# Patient Record
Sex: Female | Born: 1945
Health system: Southern US, Community
[De-identification: ages and names within clinical notes are randomized; demographics above are authoritative.]

## PROBLEM LIST (undated history)

## (undated) DIAGNOSIS — M1990 Unspecified osteoarthritis, unspecified site: Secondary | ICD-10-CM

## (undated) DIAGNOSIS — T7840XA Allergy, unspecified, initial encounter: Secondary | ICD-10-CM

## (undated) DIAGNOSIS — M67472 Ganglion, left ankle and foot: Secondary | ICD-10-CM

## (undated) DIAGNOSIS — E785 Hyperlipidemia, unspecified: Secondary | ICD-10-CM

## (undated) DIAGNOSIS — Z9109 Other allergy status, other than to drugs and biological substances: Secondary | ICD-10-CM

## (undated) DIAGNOSIS — H269 Unspecified cataract: Secondary | ICD-10-CM

## (undated) DIAGNOSIS — M8580 Other specified disorders of bone density and structure, unspecified site: Secondary | ICD-10-CM

## (undated) DIAGNOSIS — E119 Type 2 diabetes mellitus without complications: Secondary | ICD-10-CM

## (undated) HISTORY — DX: Other specified disorders of bone density and structure, unspecified site: M85.80

## (undated) HISTORY — DX: Hyperlipidemia, unspecified: E78.5

## (undated) HISTORY — DX: Allergy, unspecified, initial encounter: T78.40XA

## (undated) HISTORY — PX: POLYPECTOMY: SHX149

## (undated) HISTORY — DX: Unspecified osteoarthritis, unspecified site: M19.90

## (undated) HISTORY — DX: Type 2 diabetes mellitus without complications: E11.9

## (undated) HISTORY — DX: Unspecified cataract: H26.9

## (undated) HISTORY — DX: Ganglion, left ankle and foot: M67.472

## (undated) HISTORY — DX: Other allergy status, other than to drugs and biological substances: Z91.09

## (undated) HISTORY — PX: TONSILLECTOMY AND ADENOIDECTOMY: SUR1326

## (undated) HISTORY — PX: GANGLION CYST EXCISION: SHX1691

---

## 2002-10-14 ENCOUNTER — Other Ambulatory Visit: Admission: RE | Admit: 2002-10-14 | Discharge: 2002-10-14 | Payer: Self-pay | Admitting: Obstetrics and Gynecology

## 2004-10-09 ENCOUNTER — Other Ambulatory Visit: Admission: RE | Admit: 2004-10-09 | Discharge: 2004-10-09 | Payer: Self-pay | Admitting: Obstetrics and Gynecology

## 2004-11-06 ENCOUNTER — Ambulatory Visit: Payer: Self-pay | Admitting: Internal Medicine

## 2004-12-13 ENCOUNTER — Ambulatory Visit: Payer: Self-pay | Admitting: Internal Medicine

## 2005-03-28 ENCOUNTER — Ambulatory Visit: Payer: Self-pay | Admitting: Internal Medicine

## 2005-06-10 HISTORY — PX: COLONOSCOPY: SHX174

## 2006-04-22 ENCOUNTER — Ambulatory Visit: Payer: Self-pay | Admitting: Internal Medicine

## 2006-04-22 LAB — CONVERTED CEMR LAB
AST: 25 units/L (ref 0–37)
Chol/HDL Ratio, serum: 3.3
Cholesterol: 151 mg/dL (ref 0–200)
Creatinine, Ser: 0.8 mg/dL (ref 0.4–1.2)
Creatinine,U: 81.4 mg/dL
LDL Cholesterol: 97 mg/dL (ref 0–99)
Microalb, Ur: 0.4 mg/dL (ref 0.0–1.9)
Potassium: 4 meq/L (ref 3.5–5.1)
Triglyceride fasting, serum: 45 mg/dL (ref 0–149)

## 2006-04-30 ENCOUNTER — Ambulatory Visit: Payer: Self-pay | Admitting: Gastroenterology

## 2006-05-19 ENCOUNTER — Encounter (INDEPENDENT_AMBULATORY_CARE_PROVIDER_SITE_OTHER): Payer: Self-pay | Admitting: Specialist

## 2006-05-19 ENCOUNTER — Ambulatory Visit: Payer: Self-pay | Admitting: Gastroenterology

## 2006-10-16 DIAGNOSIS — Z9089 Acquired absence of other organs: Secondary | ICD-10-CM

## 2007-04-08 ENCOUNTER — Telehealth (INDEPENDENT_AMBULATORY_CARE_PROVIDER_SITE_OTHER): Payer: Self-pay | Admitting: *Deleted

## 2007-05-28 ENCOUNTER — Ambulatory Visit: Payer: Self-pay | Admitting: Internal Medicine

## 2007-05-28 DIAGNOSIS — E119 Type 2 diabetes mellitus without complications: Secondary | ICD-10-CM | POA: Insufficient documentation

## 2007-06-08 ENCOUNTER — Encounter (INDEPENDENT_AMBULATORY_CARE_PROVIDER_SITE_OTHER): Payer: Self-pay | Admitting: *Deleted

## 2007-06-18 ENCOUNTER — Ambulatory Visit: Payer: Self-pay | Admitting: Internal Medicine

## 2007-06-18 ENCOUNTER — Encounter (INDEPENDENT_AMBULATORY_CARE_PROVIDER_SITE_OTHER): Payer: Self-pay | Admitting: *Deleted

## 2008-01-21 ENCOUNTER — Telehealth (INDEPENDENT_AMBULATORY_CARE_PROVIDER_SITE_OTHER): Payer: Self-pay | Admitting: *Deleted

## 2008-04-20 ENCOUNTER — Ambulatory Visit: Payer: Self-pay | Admitting: Internal Medicine

## 2008-04-20 DIAGNOSIS — E785 Hyperlipidemia, unspecified: Secondary | ICD-10-CM | POA: Insufficient documentation

## 2008-04-20 DIAGNOSIS — Z86010 Personal history of colonic polyps: Secondary | ICD-10-CM

## 2008-05-02 ENCOUNTER — Encounter (INDEPENDENT_AMBULATORY_CARE_PROVIDER_SITE_OTHER): Payer: Self-pay | Admitting: *Deleted

## 2008-05-09 ENCOUNTER — Ambulatory Visit: Payer: Self-pay | Admitting: Internal Medicine

## 2008-05-09 ENCOUNTER — Encounter (INDEPENDENT_AMBULATORY_CARE_PROVIDER_SITE_OTHER): Payer: Self-pay | Admitting: *Deleted

## 2008-05-09 LAB — CONVERTED CEMR LAB
OCCULT 1: NEGATIVE
OCCULT 2: NEGATIVE
OCCULT 3: NEGATIVE

## 2008-06-28 ENCOUNTER — Ambulatory Visit: Payer: Self-pay | Admitting: Internal Medicine

## 2008-06-28 DIAGNOSIS — R55 Syncope and collapse: Secondary | ICD-10-CM

## 2008-06-28 DIAGNOSIS — R002 Palpitations: Secondary | ICD-10-CM | POA: Insufficient documentation

## 2008-06-29 ENCOUNTER — Encounter: Payer: Self-pay | Admitting: Internal Medicine

## 2008-07-07 ENCOUNTER — Ambulatory Visit: Payer: Self-pay | Admitting: Cardiovascular Disease

## 2008-07-07 ENCOUNTER — Ambulatory Visit: Payer: Self-pay

## 2008-08-04 ENCOUNTER — Telehealth (INDEPENDENT_AMBULATORY_CARE_PROVIDER_SITE_OTHER): Payer: Self-pay | Admitting: *Deleted

## 2009-03-08 ENCOUNTER — Telehealth (INDEPENDENT_AMBULATORY_CARE_PROVIDER_SITE_OTHER): Payer: Self-pay | Admitting: *Deleted

## 2010-06-22 ENCOUNTER — Ambulatory Visit
Admission: RE | Admit: 2010-06-22 | Discharge: 2010-06-22 | Payer: Self-pay | Source: Home / Self Care | Attending: Internal Medicine | Admitting: Internal Medicine

## 2010-06-22 DIAGNOSIS — J069 Acute upper respiratory infection, unspecified: Secondary | ICD-10-CM | POA: Insufficient documentation

## 2010-06-26 ENCOUNTER — Telehealth: Payer: Self-pay | Admitting: Internal Medicine

## 2010-06-28 ENCOUNTER — Telehealth (INDEPENDENT_AMBULATORY_CARE_PROVIDER_SITE_OTHER): Payer: Self-pay | Admitting: *Deleted

## 2010-06-28 ENCOUNTER — Ambulatory Visit
Admission: RE | Admit: 2010-06-28 | Discharge: 2010-06-28 | Payer: Self-pay | Source: Home / Self Care | Attending: Family Medicine | Admitting: Family Medicine

## 2010-07-02 ENCOUNTER — Telehealth (INDEPENDENT_AMBULATORY_CARE_PROVIDER_SITE_OTHER): Payer: Self-pay | Admitting: *Deleted

## 2010-07-08 LAB — CONVERTED CEMR LAB
ALT: 19 units/L (ref 0–35)
AST: 26 units/L (ref 0–37)
Albumin: 4.3 g/dL (ref 3.5–5.2)
Alkaline Phosphatase: 62 units/L (ref 39–117)
Basophils Absolute: 0 10*3/uL (ref 0.0–0.1)
Basophils Absolute: 0 10*3/uL (ref 0.0–0.1)
Basophils Relative: 0.1 % (ref 0.0–3.0)
Bilirubin, Direct: 0.1 mg/dL (ref 0.0–0.3)
CO2: 28 meq/L (ref 19–32)
Chloride: 107 meq/L (ref 96–112)
Cholesterol: 130 mg/dL (ref 0–200)
Creatinine, Ser: 0.7 mg/dL (ref 0.4–1.2)
Creatinine,U: 101.5 mg/dL
Eosinophils Absolute: 0.1 10*3/uL (ref 0.0–0.6)
Eosinophils Absolute: 0.1 10*3/uL (ref 0.0–0.7)
Eosinophils Relative: 1.4 % (ref 0.0–5.0)
Eosinophils Relative: 1.7 % (ref 0.0–5.0)
GFR calc Af Amer: 94 mL/min
GFR calc non Af Amer: 78 mL/min
GFR calc non Af Amer: 91 mL/min
Glucose, Bld: 140 mg/dL — ABNORMAL HIGH (ref 70–99)
HCT: 40.5 % (ref 36.0–46.0)
HDL: 42.3 mg/dL (ref >39.0)
Hgb A1c MFr Bld: 5.5 % (ref 4.6–6.0)
Hgb A1c MFr Bld: 5.6 % (ref 4.6–6.0)
LDL Cholesterol: 85 mg/dL (ref 0–99)
Lymphocytes Relative: 28.8 % (ref 12.0–46.0)
Lymphocytes Relative: 35.4 % (ref 12.0–46.0)
MCHC: 34.9 g/dL (ref 30.0–36.0)
MCHC: 35.8 g/dL (ref 30.0–36.0)
MCV: 87.1 fL (ref 78.0–100.0)
MCV: 89.1 fL (ref 78.0–100.0)
Microalb, Ur: 0.3 mg/dL (ref 0.0–1.9)
Monocytes Absolute: 0.4 10*3/uL (ref 0.2–0.7)
Neutro Abs: 3.6 10*3/uL (ref 1.4–7.7)
Neutrophils Relative %: 56 % (ref 43.0–77.0)
Neutrophils Relative %: 64.3 % (ref 43.0–77.0)
Platelets: 150 10*3/uL (ref 150–400)
Potassium: 3.9 meq/L (ref 3.5–5.1)
Potassium: 4.1 meq/L (ref 3.5–5.1)
RBC: 4.66 M/uL (ref 3.87–5.11)
RBC: 4.83 M/uL (ref 3.87–5.11)
Sodium: 142 meq/L (ref 135–145)
TSH: 0.86 microintl units/mL (ref 0.35–5.50)
TSH: 1.01 microintl units/mL (ref 0.35–5.50)
Total Bilirubin: 0.7 mg/dL (ref 0.3–1.2)
Total CHOL/HDL Ratio: 3.3
VLDL: 7 mg/dL (ref 0–40)
WBC: 7.6 10*3/uL (ref 4.5–10.5)

## 2010-07-12 ENCOUNTER — Telehealth (INDEPENDENT_AMBULATORY_CARE_PROVIDER_SITE_OTHER): Payer: Self-pay | Admitting: *Deleted

## 2010-07-12 NOTE — Assessment & Plan Note (Signed)
Summary: sinus problems, no fever, runny nose, buzzy ears--needs rx fo...   Vital Signs:  Patient profile:   65 year old female Height:      67.5 inches (171.45 cm) Weight:      139.38 pounds (63.35 kg) BMI:     21.59 Temp:     98.9 degrees F (37.17 degrees C) oral Resp:     15 per minute BP sitting:   140 / 62  (left arm) Cuff size:   regular  Vitals Entered By: Lucious Groves CMA (June 22, 2010 12:08 PM) CC: Sinus problems x2 weeks./kb, URI symptoms Is Patient Diabetic? No Pain Assessment Patient in pain? no      Comments Patient notes that she has been having fever, HA, clear mucous production, and congestion/ "buzzing" of her ears. She denies SOB and chest pain.   CC:  Sinus problems x2 weeks./kb and URI symptoms.  History of Present Illness:    Onset 2 weeks ago as ST, loose stool  & watery eyes. Rx: Mucinex, Allegra with benefit.  The patient now reports nasal congestion and clear nasal discharge, but denies productive cough and earache.  The patient denies fever, dyspnea, and wheezing.  Intermittent ST from [facility]. The patient also reports headache.  Risk factors for Strep sinusitis include bilateral facial pain.  The patient denies the following risk factors for Strep sinusitis: tooth pain and tender adenopathy.   FBS ranges : 85- 137; averages 107.  Current Medications (verified): 1)  Onetouch Ultra Test   Strp (Glucose Blood) .... Use As Directed 2)  Garlic .... Bid 3)  Multivitamin .... Once Daily 4)  Allergy Pill .... Prn 5)  B-Complex .... Bid 6)  Flaxseed Oil .... Bid 7)  Fish Oil .... Bid 8)  B-6 .... Qd 9)  Calcium .Marland Kitchen.. 1 By Mouth Bid  Allergies (verified): 1)  ! * Flu Shot 2)  ! * Eggs 3)  ! * [place] 4)  ! * Atropine  Physical Exam  General:  well-nourished,in no acute distress; alert,appropriate and cooperative throughout examination Eyes:  No corneal or conjunctival inflammation noted.Marland Kitchen Perrla.. Vision grossly normal. Ears:  External ear  exam shows no significant lesions or deformities.  Otoscopic examination reveals clear canals, tympanic membranes are intact bilaterally without bulging, retraction, inflammation or discharge. Hearing is grossly normal bilaterally. Nose:  External nasal examination shows no deformity or inflammation. Nasal mucosa are  erythematous without lesions or exudates. Mouth:  Oral mucosa and oropharynx without lesions or exudates.  Teeth in good repair. Lungs:  Normal respiratory effort, chest expands symmetrically. Lungs are clear to auscultation, no crackles or wheezes. Cervical Nodes:  No lymphadenopathy noted Axillary Nodes:  No palpable lymphadenopathy   Impression & Recommendations:  Problem # 1:  URI (ICD-465.9)  Problem # 2:  DIABETES MELLITUS, TYPE II, CONTROLLED (ICD-250.00)  Complete Medication List: 1)  Onetouch Ultra Test Strp (Glucose blood) .... Use as directed 2)  Garlic  .... Bid 3)  Multivitamin  .... Once daily 4)  Allergy Pill  .... Prn 5)  B-complex  .... Bid 6)  Flaxseed Oil  .... Bid 7)  Fish Oil  .... Bid 8)  B-6  .... Qd 9)  Calcium  .Marland KitchenMarland Kitchen. 1 by mouth bid 10)  Fluticasone Propionate 50 Mcg/act Susp (Fluticasone propionate) .Marland Kitchen.. 1 spray two times a day as needed 11)  Azithromycin 250 Mg Tabs (Azithromycin) .... As per pack  Patient Instructions: 1)  Neti pot once daily - two times a day  as needed for congestion. 2)  Drink as much NON dairy  fluid as you can tolerate for the next few days. 3)  Check your blood sugars regularly. If your readings are usually above :150 or below 90 you should contact our office. 4)  Check your feet each night for sore areas, calluses or signs of infection. Prescriptions: AZITHROMYCIN 250 MG TABS (AZITHROMYCIN) as per pack  #1 x 0   Entered and Authorized by:   Marga Melnick MD   Signed by:   Marga Melnick MD on 06/22/2010   Method used:   Faxed to ...       [facility] (retail)       [address] [place]       [address]        [place], [place]  [address]       Ph: [phone]       Fax: [phone]   RxID:   [id number] FLUTICASONE PROPIONATE 50 MCG/ACT SUSP (FLUTICASONE PROPIONATE) 1 spray two times a day as needed  #1 x 5   Entered and Authorized by:   Marga Melnick MD   Signed by:   Marga Melnick MD on 06/22/2010   Method used:   Faxed to ...       [facility] (retail)       [address] [place]       [address]       [place], [place]  [address]       Ph: [phone]       Fax: [phone]   RxID:   [id number]    Orders Added: 1)  Est. Patient Level III [88416]

## 2010-07-12 NOTE — Assessment & Plan Note (Signed)
Summary: Ongoing sinus issues, patient feels cold and overall doesn't ...   Vital Signs:  Patient profile:   65 year old female Weight:      144 pounds BMI:     22.30 Temp:     99.2 degrees F oral BP sitting:   110 / 60  (right arm)  Vitals Entered By: Doristine Devoid CMA (June 28, 2010 4:20 PM) CC: cold and hot flashes blood sugar up and down thinks medication cause this    History of Present Illness: 65 yo woman here today for feeling poorly.  was seen by Dr Alwyn Ren on 1/13 and dx'd w/ sinus infxn.  started on Zpack and nasal spray.  called on 1/16 and spoke w/ triage nurse- called 9-1-1, EMS came to house and took vitals (these were normal).  called office again today and reports still feeling badly.  CBGs ranging 87-149.  reports feeling hot and shakey.  husband reports that she seems to be having 'all the side effects listed on the Zpack'.  facial pain and pressure has improved.  still having 'ear buzzing'.  no cough.  + nausea and diarrhea when illness first started- this has improved.  eating regularly.  Current Medications (verified): 1)  Onetouch Ultra Test   Strp (Glucose Blood) .... Use As Directed 2)  Garlic .... Bid 3)  Multivitamin .... Once Daily 4)  Allergy Pill .... Prn 5)  B-Complex .... Bid 6)  Flaxseed Oil .... Bid 7)  Fish Oil .... Bid 8)  B-6 .... Qd 9)  Calcium .Marland Kitchen.. 1 By Mouth Bid 10)  Fluticasone Propionate 50 Mcg/act Susp (Fluticasone Propionate) .Marland Kitchen.. 1 Spray Two Times A Day As Needed  Allergies (verified): 1)  ! * Flu Shot 2)  ! * Eggs 3)  ! * [place] 4)  ! * Atropine  Review of Systems      See HPI  Physical Exam  General:  well-nourished,in no acute distress; alert,appropriate and cooperative throughout examination Head:  Normocephalic and atraumatic without obvious abnormalities. No apparent alopecia or balding.  no TTP over sinuses Eyes:  no injxn or inflammation Ears:  External ear exam shows no significant lesions or deformities.   Otoscopic examination reveals clear canals, tympanic membranes are intact bilaterally without bulging, retraction, inflammation or discharge. Hearing is grossly normal bilaterally. Nose:  External nasal examination shows no deformity or inflammation. Nasal mucosa are erythematous without lesions or exudates. Mouth:  Oral mucosa and oropharynx without lesions or exudates.  Teeth in good repair. Lungs:  Normal respiratory effort, chest expands symmetrically. Lungs are clear to auscultation, no crackles or wheezes. Abdomen:  soft, NT/ND, +BS   Impression & Recommendations:  Problem # 1:  VIRAL INFECTION-UNSPEC (ICD-079.99) Assessment New no evidence of bacterial infxn on PE.  cluster of sxs consistent w/ viral illness.  phenergan prn for nausea.  reviewed supportive care and red flags that should prompt return.  Pt expresses understanding and is in agreement w/ this plan.  Complete Medication List: 1)  Onetouch Ultra Test Strp (Glucose blood) .... Use as directed 2)  Garlic  .... Bid 3)  Multivitamin  .... Once daily 4)  Allergy Pill  .... Prn 5)  B-complex  .... Bid 6)  Flaxseed Oil  .... Bid 7)  Fish Oil  .... Bid 8)  B-6  .... Qd 9)  Calcium  .Marland KitchenMarland Kitchen. 1 by mouth bid 10)  Fluticasone Propionate 50 Mcg/act Susp (Fluticasone propionate) .Marland Kitchen.. 1 spray two times a day as needed 11)  Promethazine Hcl 25 Mg Tabs (Promethazine hcl) .Marland Kitchen.. 1 tab by mouth q6 as needed for nausea  Patient Instructions: 1)  This is most likely viral and should improve w/ time 2)  Take the Promethazine as needed for nausea 3)  Use ibuprofen for pain or fever 4)  Drink plenty of fluids 5)  Add Mucinex to thin your congestion as needed 6)  Continue the Immodium as needed 7)  Call with any questions or concerns 8)  Hang in there!! Prescriptions: PROMETHAZINE HCL 25 MG  TABS (PROMETHAZINE HCL) 1 tab by mouth Q6 as needed for nausea  #12 x 0   Entered and Authorized by:   Neena Rhymes MD   Signed by:   Neena Rhymes MD on 06/28/2010   Method used:   Electronically to        [facility] [address] (retail)       [phone]       [place], [place]  [address]       Ph: [phone]       Fax: [phone]   RxID:   [id number]    Orders Added: 1)  Est. Patient Level III [24401]

## 2010-07-12 NOTE — Progress Notes (Signed)
Summary: Call-a-Nurse  Phone Note Outgoing Call   Details for Reason: Call-A-Nurse Triage Call Report Triage Record Num: [id number] Operator: Amy Head Patient Name: Lisa Mcgee Call Date & Time: 06/25/2010 10:25:25PM Patient Phone: [phone] PCP: Marga Melnick Patient Gender: Female PCP Fax : [phone] Patient DOB: 23-Nov-1945 Practice Name[facility] Reason for Call: Pt/Lisa Mcgee calling but spoke to pt and states that she feels flushed and BP is 143/60, Diarrhea x 3 times today. BS 149, states feels weird. Throat is dry, ears are ringing, hands are warm cant get comfortable. Took Motrin 800MG  b/c she thought it would help with the inflammation in sinuses. Seen in office on Friday, 06/22/10 for Sinus infection and given Zpack and Fluticasone nose spray. BP normally runs about 116/60. States that she feels"very weird and like her dizziness and faintness is getting worse". Advised calling 911 per guideline. Verbalized understanding. Protocol(s) Used: Hypertension, Diagnosed or Suspected Recommended Outcome per Protocol: Activate EMS 911 Reason for Outcome: New or worsening signs and symptoms that may indicate shock Care Advice:  ~ Protect the patient from falling or other harm.  ~ Do not give the patient anything to eat or drink.  ~ An adult should stay with the patient, preferably one trained in CPR. Lay the person down and elevate legs at least 12 inches (30 cm) above level of heart. Cover to help maintain body temperature.  ~  ~ IMMEDIATE ACTION Write down provider's name. List or place the following in a bag for transport with the patient: current prescription and/or nonprescription medications; alternative treatments, therapies and medications; and street drugs. Summary of Call: I called to check on the patient, and she notes that she is somewhat better but she did call 911. She notes that they did not take her to the hospital and her vitals were normal.  Patient notes that today is her last day of the Z-pack and her sinuses seem to be better. She is thinking that the sinus spray and ABX were too much together. She still has diarrhea and back pain. Please advise. Initial call taken by: Lucious Groves CMA,  June 26, 2010 11:34 AM  Follow-up for Phone Call        Immodium Ad as needed for frankly watery stool. Stay on clear liquids until well. Align once daily until bowels are normal. Stool studies if frank diarrhea persists. Follow-up by: Marga Melnick MD,  June 26, 2010 1:14 PM  Additional Follow-up for Phone Call Additional follow up Details #1::        I spoke w/ pt she is aware. Army Fossa CMA  June 26, 2010 2:23 PM  Pt states her BS- 90 at 1130 am BP 147/60 pulse 83.Army Fossa CMA  June 26, 2010 2:26 PM

## 2010-07-12 NOTE — Progress Notes (Signed)
Summary: call-a-nurse  Phone Note Outgoing Call   Details for Reason: Triage Call Report Triage Record Num: [id number] Operator: Ethlyn Gallery Patient Name: [facility] Call Date & Time: 06/28/2010 6:21:17PM Patient Phone: [phone] PCP: Marga Melnick Patient Gender: Female PCP Fax : [phone] Patient DOB: Jul 04, 1945 Practice Name: [facility] Reason for Call: Cecelia/Pt called and stated she was expecting RX for Promethazine to be called in but she had spoken to the Pharmacist and it was not @ the pharmacy. Called back to [facility] and she stated pharmacy has received the RX and is filling it now. She states she has no other need for assistance @ this time. Protocol(s) Used: Office Note Recommended Outcome per Protocol: Information Noted and Sent to Office Reason for Outcome: Caller information to office Care Advice:  ~ 01/  Follow-up for Phone Call        Noted Follow-up by: Shonna Chock CMA,  July 02, 2010 10:26 AM

## 2010-07-12 NOTE — Progress Notes (Signed)
Summary: Ongoing sinus issues  Phone Note Call from Patient Call back at Home Phone [phone]   Caller: Patient Summary of Call: Message left on tirage voicemail: patient states that she is worse and would like a call   I called the patient back to clarify what's worse, patient states Bloodsugar was 92 this am, patient's Bloodsugar was 135 about 1:58, at 3:07 it was 127. Patient feels cold and shakey and still with sinus symptoms. Patient would like to be seen for her sinus concerns. Patient said she completed zpak but it didnt really help her and threw her for a loop.  Patient aware Dr.Hopper out of office and ok'd seeing  Dr.Tabori/Chrae [facility] CMA  June 28, 2010 3:46 PM

## 2010-07-18 NOTE — Progress Notes (Signed)
Summary: Still not feeling well  Phone Note Call from Patient Call back at Home Phone [phone]   Caller: Patient Summary of Call: Patient called in this morning stating that she still is feeling bad. She says her sinuses are feeling better. Patient is still feeling nauseus at time with cold chills and heat changes frequently. Patient is not taking the Promethazine as ordered by Dr. Beverely Low for the nausea. Patient says the symtpoms tend to start in the evening time. She would like to know what else she needs to be doing. She wants to know if this is still a virus and how long it will last Please advise.  Initial call taken by: Harold Barban,  July 12, 2010 11:26 AM  Follow-up for Phone Call        I spoke with patient and informed her to take med as prescribed for nausea, if symptoms then still unresolved patient to follow-up with Dr.Hopper. Patient ok'd all instruction Follow-up by: Shonna Chock CMA,  July 12, 2010 1:25 PM

## 2010-08-27 ENCOUNTER — Encounter: Payer: Self-pay | Admitting: Internal Medicine

## 2010-08-27 ENCOUNTER — Ambulatory Visit (INDEPENDENT_AMBULATORY_CARE_PROVIDER_SITE_OTHER): Payer: Self-pay | Admitting: Internal Medicine

## 2010-08-27 ENCOUNTER — Other Ambulatory Visit: Payer: Self-pay | Admitting: Internal Medicine

## 2010-08-27 DIAGNOSIS — E119 Type 2 diabetes mellitus without complications: Secondary | ICD-10-CM

## 2010-08-27 DIAGNOSIS — R109 Unspecified abdominal pain: Secondary | ICD-10-CM

## 2010-08-27 DIAGNOSIS — G479 Sleep disorder, unspecified: Secondary | ICD-10-CM

## 2010-08-27 DIAGNOSIS — R002 Palpitations: Secondary | ICD-10-CM

## 2010-08-27 DIAGNOSIS — Z86010 Personal history of colonic polyps: Secondary | ICD-10-CM

## 2010-08-28 ENCOUNTER — Ambulatory Visit
Admission: RE | Admit: 2010-08-28 | Discharge: 2010-08-28 | Disposition: A | Discharge status: Home / Self Care | Payer: Self-pay | Source: Ambulatory Visit | Attending: Internal Medicine | Admitting: Internal Medicine

## 2010-08-28 LAB — CBC WITH DIFFERENTIAL/PLATELET
Basophils Absolute: 0 10*3/uL (ref 0.0–0.1)
Eosinophils Relative: 0.5 % (ref 0.0–5.0)
Lymphs Abs: 1.9 10*3/uL (ref 0.7–4.0)
MCV: 89.2 fl (ref 78.0–100.0)
Monocytes Absolute: 0.3 10*3/uL (ref 0.1–1.0)
Neutrophils Relative %: 78.4 % — ABNORMAL HIGH (ref 43.0–77.0)
Platelets: 180 10*3/uL (ref 150.0–400.0)
RDW: 13.1 % (ref 11.5–14.6)
WBC: 10.5 10*3/uL (ref 4.5–10.5)

## 2010-08-28 LAB — BASIC METABOLIC PANEL
BUN: 9 mg/dL (ref 6–23)
Chloride: 99 mEq/L (ref 96–112)
Creatinine, Ser: 0.7 mg/dL (ref 0.4–1.2)
GFR: 94.65 mL/min (ref >60.00)
Glucose, Bld: 96 mg/dL (ref 70–99)

## 2010-08-28 LAB — TSH: TSH: 0.53 u[IU]/mL (ref 0.35–5.50)

## 2010-08-28 LAB — HEMOGLOBIN A1C: Hgb A1c MFr Bld: 5.7 % (ref 4.6–6.5)

## 2010-09-06 NOTE — Assessment & Plan Note (Signed)
Summary: concerned about blood sugars and not feeling well/cdj   Vital Signs:  Patient profile:   65 year old female Weight:      132.4 pounds BMI:     20.50 Temp:     98.6 degrees F oral Pulse rate:   76 / minute Resp:     15 per minute BP sitting:   130 / 72  (left arm) Cuff size:   regular  Vitals Entered By: Shonna Chock CMA (August 27, 2010 1:32 PM) CC: BS concerns: 145 this morning (patient had 3 crackers, water, black coffee), Abdominal pain, Type 2 diabetes mellitus follow-up, Insomnia   CC:  BS concerns: 145 this morning (patient had 3 crackers, water, black coffee), Abdominal pain, Type 2 diabetes mellitus follow-up, and Insomnia.  History of Present Illness:      Lisa Mcgee  has multiple concerns. She has had   recurrent nausea & abdominal discomfort over past week  ; original symptoms started in 06/2010 with "virus".She  reports constipation and anorexia, but denies vomiting, diarrhea, melena, and hematemesis.  The location of the pain is suprapubic.  The pain is described as dull and non  radiating Associated symptoms include weight loss of 13#.  The patient denies the following symptoms:  fever, dysuria, chest pain ( but palpitations ), jaundice, dark urine, and vaginal bleeding.  The pain is worse with movement.  The pain is better with rocking & "getting my mind off it". She questions relationship to DM.PMH of colon polyp in 2007;   repeat colonoscopy due this year.    She  reports polyuria, but denies polydipsia, blurred vision, self managed hypoglycemia, and numbness of extremities.  The patient denies the following symptoms: neuropathic pain, poor wound healing, vision loss, and foot ulcer.  Since the last visit the patient reports good dietary compliance and monitoring blood glucose.  The patient has been measuring capillary blood glucose: before breakfast( 85-129) and 2 hrs after any meal < 150.  Since the last visit, the patient reports having had no recent  eye care by an  ophthalmologist.        The patient also presents with Insomnia for several months.  The patient reports frequent awakening and early awakening, but denies difficulty falling asleep, nightmares, leg movements, snoring, apnea noted by partner, and daytime somnolence.  Past treatments that have been  ineffective include ibuprofen.  Current Medications (verified): 1)  Onetouch Ultra Test   Strp (Glucose Blood) .... Use As Directed 2)  Garlic .... Bid 3)  Multivitamin .... Once Daily 4)  Allergy Pill .... Prn 5)  Flaxseed Oil .... Bid 6)  Fish Oil .... Bid 7)  B-6 .... Qd 8)  Calcium .Marland Kitchen.. 1 By Mouth Bid 9)  Fluticasone Propionate 50 Mcg/act Susp (Fluticasone Propionate) .Marland Kitchen.. 1 Spray Two Times A Day As Needed 10)  Promethazine Hcl 25 Mg  Tabs (Promethazine Hcl) .Marland Kitchen.. 1 Tab By Mouth Q6 As Needed For Nausea 11)  Cetaphil Moisturizing  Crea (Emollient) .... Two Times A Day As Needed For Itching or Flakey Skin 12)  Ketoconazole 2 % Crea (Ketoconazole) .... As Needed 13)  Dovcnex 0/.005 % Topical Solution .... After Ketocanazole Shampoo 14)  Bactroban 2 % Oint (Mupirocin) .... As Needed 15)  Clobetasol Propionate 0.05 % Crea (Clobetasol Propionate) .... As Needed For Body Rash  Allergies: 1)  ! * Flu Shot 2)  ! * Eggs 3)  ! * [place] 4)  ! * Atropine  Past History:  Past  Medical History: Diabetes mellitus, type II, A1c 11.7 % 10/31/2004 Hyperlipidemia Colonic polyps, PMH  of 2007, Dr Jarold Motto  Physical Exam  General:  Tnin but well-nourished,in no acute distress; alert,appropriate and cooperative throughout examination Eyes:  No corneal or conjunctival inflammation noted. EOMI. Perrla.No lid lag Mouth:  Oral mucosa and oropharynx without lesions or exudates.  Teeth in good repair. No pharyngeal erythema.   Neck:  No deformities, masses, or tenderness noted. Lungs:  Normal respiratory effort, chest expands symmetrically. Lungs are clear to auscultation, no crackles or wheezes. Heart:   normal rate, regular rhythm, no gallop, no rub, no JVD, no HJR, and grade 1 /6 systolic murmur.   Abdomen:  Bowel sounds positive,abdomen soft and non-tender without masses, organomegaly or hernias noted. Aorta  ? enlarged   with  bruit  Skin:  Intact without suspicious lesions or rashes. No jaundice Cervical Nodes:  No lymphadenopathy noted Axillary Nodes:  No palpable lymphadenopathy Psych:  memory intact for recent and remote, normally interactive, and slightly anxious.     Impression & Recommendations:  Problem # 1:  ABDOMINAL PAIN, SUPRAPUBIC (ICD-789.09)  Orders: Venipuncture ([address]) TLB-CBC Platelet - w/Differential (85025-CBCD) Radiology Referral (Radiology)  Problem # 2:  SLEEP DISORDER (ICD-780.50)  Problem # 3:  DIABETES MELLITUS, TYPE II, CONTROLLED (ICD-250.00)  PMH of  Orders: Venipuncture ([address]) TLB-A1C / Hgb A1C (Glycohemoglobin) (83036-A1C)  Problem # 4:  PALPITATIONS (ICD-785.1)  Orders: EKG w/ Interpretation ([address]) Venipuncture ([address]11) TLB-BMP (Basic Metabolic Panel-BMET) (80048-METABOL) TLB-TSH (Thyroid Stimulating Hormone) (84443-TSH)  Problem # 5:  COLONIC POLYPS, HX OF (ICD-V12.72)  Orders: Venipuncture (91478) TLB-CBC Platelet - w/Differential (85025-CBCD)  Complete Medication List: 1)  Onetouch Ultra Test Strp (Glucose blood) .... Use as directed 2)  Garlic  .... Bid 3)  Multivitamin  .... Once daily 4)  Allergy Pill  .... Prn 5)  Flaxseed Oil  .... Bid 6)  Fish Oil  .... Bid 7)  B-6  .... Qd 8)  Calcium  .Marland KitchenMarland Kitchen. 1 by mouth bid 9)  Fluticasone Propionate 50 Mcg/act Susp (Fluticasone propionate) .Marland Kitchen.. 1 spray two times a day as needed 10)  Promethazine Hcl 25 Mg Tabs (Promethazine hcl) .Marland Kitchen.. 1 tab by mouth q6 as needed for nausea 11)  Cetaphil Moisturizing Crea (Emollient) .... Two times a day as needed for itching or flakey skin 12)  Ketoconazole 2 % Crea (Ketoconazole) .... As needed 13)  Dovcnex 0/.005 % Topical Solution  .... After  ketocanazole shampoo 14)  Bactroban 2 % Oint (Mupirocin) .... As needed 15)  Clobetasol Propionate 0.05 % Crea (Clobetasol propionate) .... As needed for body rash 16)  Lorazepam 0.5 Mg Tabs (Lorazepam) .Marland Kitchen.. 1 every 8 hrs as needed stress 17)  Bactroban 2 % Oint (Mupirocin) .... Apply two times a day as needed  Patient Instructions: 1)   Avoid eating within two hours of lying down or before exercising. Do not over eat; try smaller more frequent meals. Elevate head of bed twelve inches when sleeping.Complete stool cards 2)  Take 650-1000mg  of Tylenol every 4-6 hours as needed for relief of pain or comfort of fever AVOID taking more than 4000mg   in a 24 hour period (can cause liver damage in higher doses). Prescriptions: BACTROBAN 2 % OINT (MUPIROCIN) apply two times a day as needed  #15 grams x 1   Entered and Authorized by:   Marga Melnick MD   Signed by:   Marga Melnick MD on 08/27/2010   Method used:   Print then Give to  Patient   RxID:   [id number] LORAZEPAM 0.5 MG TABS (LORAZEPAM) 1 every 8 hrs as needed stress  #30 x 0   Entered and Authorized by:   Marga Melnick MD   Signed by:   Marga Melnick MD on 08/27/2010   Method used:   Print then Give to Patient   RxID:   [id number]    Orders Added: 1)  Est. Patient Level IV [47425] 2)  EKG w/ Interpretation [93000] 3)  Venipuncture [36415] 4)  TLB-BMP (Basic Metabolic Panel-BMET) [80048-METABOL] 5)  TLB-CBC Platelet - w/Differential [85025-CBCD] 6)  TLB-TSH (Thyroid Stimulating Hormone) [84443-TSH] 7)  TLB-A1C / Hgb A1C (Glycohemoglobin) [83036-A1C] 8)  Radiology Referral [Radiology]

## 2010-10-09 ENCOUNTER — Other Ambulatory Visit: Payer: Self-pay

## 2010-10-09 ENCOUNTER — Other Ambulatory Visit: Payer: Self-pay | Admitting: Internal Medicine

## 2010-10-09 DIAGNOSIS — Z1211 Encounter for screening for malignant neoplasm of colon: Secondary | ICD-10-CM

## 2010-10-09 LAB — POC HEMOCCULT BLD/STL (OFFICE/1-CARD/DIAGNOSTIC): Fecal Occult Blood, POC: NEGATIVE

## 2011-04-19 ENCOUNTER — Encounter: Payer: Self-pay | Admitting: Internal Medicine

## 2011-04-19 ENCOUNTER — Ambulatory Visit (INDEPENDENT_AMBULATORY_CARE_PROVIDER_SITE_OTHER): Payer: Self-pay | Admitting: Internal Medicine

## 2011-04-19 DIAGNOSIS — J309 Allergic rhinitis, unspecified: Secondary | ICD-10-CM

## 2011-04-19 DIAGNOSIS — E119 Type 2 diabetes mellitus without complications: Secondary | ICD-10-CM

## 2011-04-19 MED ORDER — FLUTICASONE PROPIONATE 50 MCG/ACT NA SUSP: 2.0000 | Freq: Every day | NASAL | Status: DC | PRN

## 2011-04-19 MED ORDER — LORAZEPAM 0.5 MG PO TABS: 0.5000 mg | ORAL_TABLET | Freq: Three times a day (TID) | ORAL | Status: DC

## 2011-04-19 NOTE — Patient Instructions (Addendum)
Fluticasone 1 spray in each nostril twice a day as needed. Use the "crossover" technique as discussed  Plain Mucinex for thick secretions ;force NON dairy fluids . Use a Neti pot daily as needed for sinus congestion. Mucinex DM as needed for cough.  Use either over-the-counter Zyrtec at bedtime for the allergic symptoms or plain Allegra 180 mg in the day. The Allegra is nonsedating.

## 2011-04-19 NOTE — Progress Notes (Signed)
  Subjective:    Patient ID: Lisa Mcgee, female    DOB: 03-11-1946, 65 y.o.   MRN: [id number]  HPI Respiratory tract infection Onset/symptoms:10/18 as ST Exposures (illness/environmental/extrinsic):no Progression of symptoms:to head congestion with PNDrainage Treatments/response:Neti pot , generic Flonase, NSAIDS, Mucinex, Benadryl ; all beneficial Present symptoms: Fever/chills/sweats:occasional temp & chills Frontal headache:not now Facial pain:no Nasal purulence:prev yellow, now clear Sore throat:not now Dental pain:not  now Lymphadenopathy:no Wheezing/shortness of breath:no Cough/sputum/hemoptysis:no Associated extrinsic/allergic symptoms:itchy eyes/ sneezing:intermittent sneezing Past medical history: Seasonal allergies: yes/asthma:no Smoking history:never           Review of Systems FBS < 109, 93 today     Objective:   Physical Exam General appearance ; thin ,  good health and nourishment; no acute distress or increased work of breathing is present.  No  lymphadenopathy about the head, neck, or axilla noted.   Eyes: No conjunctival inflammation or lid edema is present.   Ears:  External ear exam shows no significant lesions or deformities.  Otoscopic examination reveals clear canals, tympanic membranes are intact bilaterally without bulging, retraction, inflammation or discharge.  Nose:  External nasal examination shows no deformity or inflammation. Nasal mucosa are pink and moist without lesions or exudates. No septal dislocation .No obstruction to airflow.   Oral exam: Dental hygiene is good; lips and gums are healthy appearing.There is no oropharyngeal erythema or exudate noted.   Heart:  Normal rate and regular rhythm. S1 and S2 normal without gallop,  click, rub or other extra sounds.  Grade 1/6 systolic murmur  Lungs:Chest clear to auscultation; no wheezes, rhonchi,rales ,or rubs present.No increased work of breathing.    Extremities:  No cyanosis,  edema, or clubbing  noted    Skin: Warm & dry           Assessment & Plan:  #1 upper respiratory tract symptoms which sounded mainly extrinsic at this time. She notes that Benadryl has been the most beneficial. She does have a strong history of allergies including eggs,poplar trees, beef , & pecans.  She does not have signs of or symptoms of rhinosinusitis at this time. She did have some purulence prior to using any pot.  Plan: See orders and recommendations.

## 2011-04-22 ENCOUNTER — Other Ambulatory Visit: Payer: Self-pay | Admitting: Internal Medicine

## 2011-05-24 ENCOUNTER — Other Ambulatory Visit: Payer: Self-pay

## 2011-05-24 ENCOUNTER — Encounter: Payer: Self-pay | Admitting: Internal Medicine

## 2011-05-24 ENCOUNTER — Ambulatory Visit (INDEPENDENT_AMBULATORY_CARE_PROVIDER_SITE_OTHER): Payer: Self-pay | Admitting: Internal Medicine

## 2011-05-24 VITALS — BP 130/72 | HR 84 | Ht 67.0 in | Wt 135.4 lb

## 2011-05-24 DIAGNOSIS — J309 Allergic rhinitis, unspecified: Secondary | ICD-10-CM

## 2011-05-24 DIAGNOSIS — J3089 Other allergic rhinitis: Secondary | ICD-10-CM

## 2011-05-24 NOTE — Progress Notes (Signed)
05/24/11- 54 yoF never smoker referred courtesy of Dr. hot for for allergy evaluation. She complains of itching and watering eyes, a full discomfort in her ears with popping, sore throats nasal drainage and sneezing "all my life". She remembers being on allergy vaccine for 9 years, about 40 years ago and remembers that as helpful. She blames occasional nausea and bladder symptoms on her nasal drainage. Symptoms are apt to be perennial but worst in the spring and fall. This year she noted onset in October as she returned from the beach. She is taking Flonase, loratadine and saline nasal rinse. History of tonsillectomy. She denies sensitivity to insect stings, asthma or hives. She thinks she was allergic to egg in the past so she declines flu vaccine. She is married, retired and living with husband. Family history that her mother had allergic rhinitis problems.  ROS-see HPI Constitutional:   No-   weight loss, night sweats, fevers, chills, fatigue, lassitude. HEENT:   No-  headaches, difficulty swallowing, tooth/dental problems, sore throat,       + sneezing, itching, ear ache, nasal congestion, post nasal drip,  CV:  No-   chest pain, orthopnea, PND, swelling in lower extremities, anasarca,                                  dizziness, palpitations Resp: No-   shortness of breath with exertion or at rest.              No-   productive cough,  No non-productive cough,  No- coughing up of blood.              No-   change in color of mucus.  No- wheezing.   Skin: No-   rash or lesions. GI:  No-   heartburn, indigestion, abdominal pain,+ nausea, No-vomiting, diarrhea,                 change in bowel habits, loss of appetite GU: +  Dysuria- blamed on allergic postnasal drip(?),No- change in color of urine, no urgency or frequency.  No- flank pain. MS:  No-   joint pain or swelling.  No- decreased range of motion.  No- back pain. Neuro-     nothing unusual Psych:  No- change in mood or affect. No depression  or anxiety.  No memory loss.  OBJ General- Alert, Oriented, Affect-appropriate, Distress- none acute, trim, talkative Skin- rash-none, lesions- none, excoriation- none Lymphadenopathy- none Head- atraumatic            Eyes- Gross vision intact, PERRLA, conjunctivae clear secretions            Ears- Hearing, canals-normal            Nose- Clear, no-Septal dev, mucus, polyps, erosion, perforation             Throat- Mallampati II , mucosa clear , drainage- none, tonsils- atrophic Neck- flexible , trachea midline, no stridor , thyroid nl, carotid no bruit Chest - symmetrical excursion , unlabored           Heart/CV- RRR , no murmur , no gallop  , no rub, nl s1 s2                           - JVD- none , edema- none, stasis changes- none, varices- none  Lung- clear to P&A, wheeze- none, cough- none , dullness-none, rub- none           Chest wall-  Abd- tender-no, distended-no, bowel sounds-present, HSM- no Br/ Gen/ Rectal- Not done, not indicated Extrem- cyanosis- none, clubbing, none, atrophy- none, strength- nl Neuro- grossly intact to observation

## 2011-05-24 NOTE — Patient Instructions (Signed)
Order- Allergy Profile  Dx allergic rhinitis  Schedule return for allergy skin tests as able. Anti- histamines directly block the skin tests so need to be off all allergy meds for 3 days before the testing- including loratadine, allegra and zyrtec, otc sleep meds and cough syrups   Meanwhile, try using Zyrtec/ cetirizine instead of loratadine.

## 2011-05-26 DIAGNOSIS — J310 Chronic rhinitis: Secondary | ICD-10-CM | POA: Insufficient documentation

## 2011-05-26 NOTE — Assessment & Plan Note (Addendum)
The allergens she remembers from skin testing and vaccine therapy 40 years ago seemed to have been mostly foods. She avoids egg based on that history, without any recent confirming symptoms. Her concern that bladder discomfort might be from postnasal drainage is not likely correct. We discussed the nature of allergic reactions, especially immediate reactions related to IgE, which is the group associated with most intense acute symptoms and most easily treated. Plan-allergy profile IgE assessment. We will decide from that whether environmental management and symptomatic therapies will be sufficient. Schedule tentative return for skin testing. Try alternative antihistamine.

## 2011-05-27 LAB — ALLERGY FULL PROFILE
Allergen, D pternoyssinus,d7: <0.1 kU/L (ref <0.35)
Alternaria Alternata: <0.1 kU/L (ref <0.35)
Aspergillus fumigatus, IgG: <0.1 kU/L (ref <0.35)
Bahia Grass: <0.1 kU/L (ref <0.35)
Dog Dander: <0.1 kU/L (ref <0.35)
Elm IgE: <0.1 kU/L (ref <0.35)
G009 Red Top: <0.1 kU/L (ref <0.35)
House Dust Hollister: <0.1 kU/L (ref <0.35)
Lamb's Quarters: <0.1 kU/L (ref <0.35)
Plantain: <0.1 kU/L (ref <0.35)
Sycamore Tree: <0.1 kU/L (ref <0.35)

## 2011-06-10 NOTE — Progress Notes (Signed)
Quick Note:  Pt aware of results and will keep appt for skin testing. ______

## 2011-08-07 ENCOUNTER — Encounter: Payer: Self-pay | Admitting: Gastroenterology

## 2011-08-14 ENCOUNTER — Other Ambulatory Visit: Payer: Self-pay | Admitting: Internal Medicine

## 2011-08-14 ENCOUNTER — Ambulatory Visit (INDEPENDENT_AMBULATORY_CARE_PROVIDER_SITE_OTHER): Payer: Self-pay | Admitting: Internal Medicine

## 2011-08-14 ENCOUNTER — Other Ambulatory Visit: Payer: Self-pay

## 2011-08-14 ENCOUNTER — Encounter: Payer: Self-pay | Admitting: Internal Medicine

## 2011-08-14 VITALS — BP 150/60 | HR 90 | Ht 67.0 in | Wt 137.4 lb

## 2011-08-14 DIAGNOSIS — J3089 Other allergic rhinitis: Secondary | ICD-10-CM

## 2011-08-14 NOTE — Patient Instructions (Signed)
Ok to use daily Claritin/ loratadine, or an alternative like Zyrtec/ cetirizine or Allegra/ fexofenadine  Ok to use Floanase/ fluticasone nasal spray   2 sprays each nostril every night at bedtime (after your Neti pot)   Order- Food Allergy Profile - per [employer]

## 2011-08-14 NOTE — Progress Notes (Addendum)
05/24/11- 49 yoF never smoker referred courtesy of Dr. Alwyn Ren for for allergy evaluation. She complains of itching and watering eyes, a full discomfort in her ears with popping, sore throats nasal drainage and sneezing "all my life". She remembers being on allergy vaccine for 9 years, about 40 years ago and remembers that as helpful. She blames occasional nausea and bladder symptoms on her nasal drainage. Symptoms are apt to be perennial but worst in the spring and fall. This year she noted onset in October as she returned from the beach. She is taking Flonase, loratadine and saline nasal rinse. History of tonsillectomy. She denies sensitivity to insect stings, asthma or hives. She thinks she was allergic to egg in the past so she declines flu vaccine. She is married, retired and living with husband. Family history that her mother had allergic rhinitis problems.  08/14/11-65 yoF never smoker referred courtesy of Dr. Alwyn Ren for for allergy evaluation. She complains of itching and watering eyes, a full discomfort in her ears with popping, sore throats nasal drainage and sneezing "all my life".  She again expresses concern about egg. Nonspecific irritants and strong odors/perfumes are her most obvious triggers. Allergy Profile-05/24/11- Neg- Total IgE 5.7, no specific elevations Allergy skin testing-limited positive intradermal reactions for ragweed, Beech tree and house dust.  ROS-see HPI Constitutional:   No-   weight loss, night sweats, fevers, chills, fatigue, lassitude. HEENT:   No-  headaches, difficulty swallowing, tooth/dental problems, sore throat,       + sneezing, itching, ear ache, nasal congestion, post nasal drip,  CV:  No-   chest pain, orthopnea, PND, swelling in lower extremities, anasarca, dizziness, palpitations Resp: No-   shortness of breath with exertion or at rest.              No-   productive cough,  No non-productive cough,  No- coughing up of blood.              No-   change in  color of mucus.  No- wheezing.   Skin: No-   rash or lesions. GI:  No-   heartburn, indigestion, abdominal pain,+ nausea, No-vomiting, GU: +  Dysuria- blamed on allergic postnasal drip(?),No- change in color of urine, no urgency or frequency.  No- flank pain. MS:  No-   joint pain or swelling. . Neuro-     nothing unusual Psych:  No- change in mood or affect. No depression or anxiety.  No memory loss.  OBJ General- Alert, Oriented, Affect-appropriate, Distress- none acute, trim, talkative Skin- rash-none, lesions- none, excoriation- none Lymphadenopathy- none Head- atraumatic            Eyes- Gross vision intact, PERRLA, conjunctivae clear secretions            Ears- Hearing, canals  +cerumen            Nose- Clear, no-Septal dev, mucus, polyps, erosion, perforation             Throat- Mallampati II , mucosa clear , drainage- none, tonsils- atrophic Neck- flexible , trachea midline, no stridor , thyroid nl, carotid no bruit Chest - symmetrical excursion , unlabored           Heart/CV- RRR , no murmur , no gallop  , no rub, nl s1 s2                           - JVD- none , edema- none, stasis  changes- none, varices- none           Lung- clear to P&A, wheeze- none, cough- none , dullness-none, rub- none           Chest wall-  Abd-  Br/ Gen/ Rectal- Not done, not indicated Extrem- cyanosis- none, clubbing, none, atrophy- none, strength- nl Neuro- grossly intact to observation

## 2011-08-15 LAB — ALLERGEN FOOD PROFILE SPECIFIC IGE
Milk IgE: <0.1 kU/L (ref <0.35)
Orange: <0.1 kU/L (ref <0.35)
Peanut IgE: <0.1 kU/L (ref <0.35)
Soybean IgE: <0.1 kU/L (ref <0.35)
Tomato IgE: <0.1 kU/L (ref <0.35)
Tuna IgE: <0.1 kU/L (ref <0.35)

## 2011-08-15 LAB — ALLERGEN CARROT: Carrot: <0.1 kU/L (ref <0.35)

## 2011-08-15 LAB — ALLERGEN CELERY: Celery: <0.1 kU/L (ref <0.35)

## 2011-08-17 NOTE — Assessment & Plan Note (Signed)
Nonspecific irritants including strong odors seem to be more important now that an IgE mediated allergic process. She may still benefit from antihistamines, nasal saline and nasal steroids.

## 2011-08-22 ENCOUNTER — Telehealth: Payer: Self-pay | Admitting: Internal Medicine

## 2011-08-22 ENCOUNTER — Encounter: Payer: Self-pay | Admitting: Internal Medicine

## 2011-08-22 NOTE — Telephone Encounter (Signed)
Called and spoke with pt. Pt states she had allergy testing done 08/14/11 and also bloodwork.  Pt is requesting these results.  Also states she thought she was tested for eggs and in the past she was told she was allergic to eggs. Wanted to know if this was eggs or egg whites? CY, please advise.  Thanks!

## 2011-08-23 NOTE — Telephone Encounter (Signed)
She did not react to whole egg by skin testing or blood test. If she is concerned, we can double check this with additional blood test for egg yolk and egg white.

## 2011-08-23 NOTE — Telephone Encounter (Signed)
lmtcb

## 2011-08-26 NOTE — Telephone Encounter (Signed)
I have placed this in the mail. Will sign off message

## 2011-08-26 NOTE — Telephone Encounter (Signed)
I spoke with patient about results and she verbalized understanding and had no questions. Pt would like a copy mailed to her for her records. I have confirmed the address and will mail this to her

## 2011-09-09 ENCOUNTER — Telehealth: Payer: Self-pay | Admitting: Internal Medicine

## 2011-09-09 NOTE — Telephone Encounter (Signed)
Spoke with pt. She states that she has been looking at her skin test results, and from what she understood, she was "not really that allergic to a lot", but there are 3 items on the result sheet with a "2" beside them, and she is asking how serious this is and "what does it mean". Please advise, thanks!

## 2011-09-11 NOTE — Telephone Encounter (Signed)
She did react to ragweed, another weed called cocklebur, Beech tree pollen and house dust. If exposed, especially to larger amounts of these, they may cause some allergy symptoms. The same swelling and itching reactions seen in her skin tests may happen in her eyes and airways. The overall allergy antibody levels were not very high, and we couldn't show a sensitivity to foods by this kind of testing.

## 2011-09-11 NOTE — Telephone Encounter (Signed)
Patient is calling again.  She states she still has not heard from nurse about allergy results.  Please return call.  [phone]

## 2011-09-11 NOTE — Telephone Encounter (Signed)
I spoke with pt and advised her still awaiting CDY response. She voiced her understanding. Please advise Dr. Maple Hudson, thanks

## 2011-09-11 NOTE — Telephone Encounter (Signed)
lmtcb

## 2011-09-12 NOTE — Telephone Encounter (Signed)
Spoke with pt and advised no sooner openings with CDY and should keep planned rov with him in May to discuss results in more detail. Pt verbalized understanding.

## 2011-09-12 NOTE — Telephone Encounter (Signed)
Per CY-she can come in for earlier appt if regular space is open-no using RN slots; otherwise he will discuss with her at her next OV.

## 2011-09-12 NOTE — Telephone Encounter (Signed)
I called pt and she is aware of allergy test results per Dr. Maple Hudson. Pt has a multitude of questions that I was not able to answer for her and she would like to speak with Dr. Maple Hudson for these answers. Pt is not due for follow-up until 10/17/2011. Dr. Maple Hudson, pls give pt a call at your convenience or does pt need a sooner appt? Pls advsie.

## 2011-09-16 NOTE — Progress Notes (Signed)
Quick Note:  Pt aware of results. ______ 

## 2011-10-17 ENCOUNTER — Encounter: Payer: Self-pay | Admitting: Internal Medicine

## 2011-10-17 ENCOUNTER — Ambulatory Visit (INDEPENDENT_AMBULATORY_CARE_PROVIDER_SITE_OTHER): Payer: Self-pay | Admitting: Internal Medicine

## 2011-10-17 VITALS — BP 138/66 | HR 82 | Ht 67.0 in | Wt 142.0 lb

## 2011-10-17 DIAGNOSIS — J310 Chronic rhinitis: Secondary | ICD-10-CM

## 2011-10-17 NOTE — Patient Instructions (Signed)
We were not able to demonstrate an IgE type allergic reaction to the materials we tested. As long as you find that antihistamines help, they are fine to use.  Consider the dust and allergy precaution print information I gave you for reducing triggers in your home.

## 2011-10-17 NOTE — Progress Notes (Signed)
05/24/11- 75 yoF never smoker referred courtesy of Dr. Alwyn Ren for for allergy evaluation. She complains of itching and watering eyes, a full discomfort in her ears with popping, sore throats nasal drainage and sneezing "all my life". She remembers being on allergy vaccine for 9 years, about 40 years ago and remembers that as helpful. She blames occasional nausea and bladder symptoms on her nasal drainage. Symptoms are apt to be perennial but worst in the spring and fall. This year she noted onset in October as she returned from the beach. She is taking Flonase, loratadine and saline nasal rinse. History of tonsillectomy. She denies sensitivity to insect stings, asthma or hives. She thinks she was allergic to egg in the past so she declines flu vaccine. She is married, retired and living with husband. Family history that her mother had allergic rhinitis problems.  08/14/11-65 yoF never smoker referred courtesy of Dr. Alwyn Ren for for allergy evaluation. She complains of itching and watering eyes, a full discomfort in her ears with popping, sore throats nasal drainage and sneezing "all my life".  She again expresses concern about egg. Nonspecific irritants and strong odors/perfumes are her most obvious triggers. Allergy Profile-05/24/11- Neg- Total IgE 5.7, no specific elevations Allergy skin testing-limited positive intradermal reactions for ragweed, Beech tree and house dust.  10/17/11-65 yoF never smoker followed for allergic rhinitis/ conjunctivitis Denies any troubles; still using Mucinex and Zyrtec([facility]) When needed she can take an antihistamine and nasal steroid. Allergy profiles- Neg for celery, pecan, carrot, cottonwood  ROS-see HPI Constitutional:   No-   weight loss, night sweats, fevers, chills, fatigue, lassitude. HEENT:   No-  headaches, difficulty swallowing, tooth/dental problems, sore throat,       + sneezing, itching, ear ache, nasal congestion, post nasal drip,  CV:  No-   chest pain,  orthopnea, PND, swelling in lower extremities, anasarca, dizziness, palpitations Resp: No-   shortness of breath with exertion or at rest.              No-   productive cough,  No non-productive cough,  No- coughing up of blood.              No-   change in color of mucus.  No- wheezing.   Skin: No-   rash or lesions. GI:  No-   heartburn, indigestion, abdominal pain,+ nausea, No-vomiting, GU:  MS:  No-   joint pain or swelling. . Neuro-     nothing unusual Psych:  No- change in mood or affect. No depression or anxiety.  No memory loss.  OBJ General- Alert, Oriented, Affect-appropriate, Distress- none acute, trim, talkative Skin- rash-none, lesions- none, excoriation- none Lymphadenopathy- none Head- atraumatic            Eyes- Gross vision intact, PERRLA, conjunctivae clear secretions, not injected            Ears- Hearing, canals  +cerumen            Nose- Clear, no-Septal dev, mucus, polyps, erosion, perforation             Throat- Mallampati II , mucosa clear , drainage- none, tonsils- atrophic Neck- flexible , trachea midline, no stridor , thyroid nl, carotid no bruit Chest - symmetrical excursion , unlabored           Heart/CV- RRR , no murmur , no gallop  , no rub, nl s1 s2                           -  JVD- none , edema- none, stasis changes- none, varices- none           Lung- clear to P&A, wheeze- none, cough- none , dullness-none, rub- none           Chest wall-  Abd-  Br/ Gen/ Rectal- Not done, not indicated Extrem- cyanosis- none, clubbing, none, atrophy- none, strength- nl Neuro- grossly intact to observation     

## 2011-10-21 NOTE — Assessment & Plan Note (Signed)
Additional allergy profile IgE levels she was interested in, particularly pecan, have returned not elevated Her pattern best fits a vasomotor/ irritant  rhinitis. There may be mild increased symptoms in peak seasonal pollen. She does notice symptomatic benefit with antihistamines and nasal steroids so she will continue with these as needed.

## 2011-11-07 NOTE — Progress Notes (Signed)
Quick Note:  LMTCB ______ 

## 2011-11-14 NOTE — Progress Notes (Signed)
Quick Note:  Pt aware of results. ______ 

## 2012-05-01 ENCOUNTER — Encounter: Payer: Self-pay | Admitting: Gastroenterology

## 2012-05-21 ENCOUNTER — Other Ambulatory Visit: Payer: Self-pay | Admitting: Internal Medicine

## 2012-06-10 HISTORY — PX: COLONOSCOPY: SHX174

## 2012-06-18 ENCOUNTER — Encounter: Payer: Self-pay | Admitting: Internal Medicine

## 2012-06-18 ENCOUNTER — Ambulatory Visit: Payer: Self-pay

## 2012-06-19 ENCOUNTER — Other Ambulatory Visit: Payer: Self-pay | Admitting: Internal Medicine

## 2012-06-20 ENCOUNTER — Encounter: Payer: Self-pay | Admitting: Internal Medicine

## 2012-06-20 DIAGNOSIS — R7309 Other abnormal glucose: Secondary | ICD-10-CM

## 2012-06-23 ENCOUNTER — Encounter: Payer: Self-pay | Admitting: Internal Medicine

## 2012-06-24 NOTE — Addendum Note (Signed)
Addended by: Maurice Small on: 06/24/2012 07:56 AM   Modules accepted: Orders

## 2012-07-01 ENCOUNTER — Telehealth: Payer: Self-pay | Admitting: Internal Medicine

## 2012-07-01 ENCOUNTER — Other Ambulatory Visit: Payer: Self-pay

## 2012-07-01 DIAGNOSIS — E119 Type 2 diabetes mellitus without complications: Secondary | ICD-10-CM

## 2012-07-01 NOTE — Telephone Encounter (Signed)
918am called pt on hp# advised pt orders for labs in at [facility] could go anytime between 8-5

## 2012-07-01 NOTE — Telephone Encounter (Signed)
called pt to resch lab appt due to no lab tech, patient states she wants to go [facility] to get her labs done, can you cahnge the orders pt would like to go tomorrow Please let me know when ready and I will gladly call patient back  Thanks

## 2012-07-01 NOTE — Telephone Encounter (Signed)
Orders placed.

## 2012-07-02 ENCOUNTER — Other Ambulatory Visit (INDEPENDENT_AMBULATORY_CARE_PROVIDER_SITE_OTHER): Payer: Self-pay

## 2012-07-02 DIAGNOSIS — E119 Type 2 diabetes mellitus without complications: Secondary | ICD-10-CM

## 2012-07-02 LAB — MICROALBUMIN / CREATININE URINE RATIO
Creatinine,U: 105.3 mg/dL
Microalb Creat Ratio: 0.2 mg/g (ref 0.0–30.0)

## 2012-07-03 ENCOUNTER — Other Ambulatory Visit: Payer: Self-pay | Admitting: Internal Medicine

## 2012-07-08 ENCOUNTER — Other Ambulatory Visit: Payer: Self-pay

## 2012-07-08 MED ORDER — GLUCOSE BLOOD VI STRP: ORAL_STRIP | Status: DC

## 2012-07-08 NOTE — Telephone Encounter (Signed)
RX re-sent , I called the pharmacy and verified rx was received

## 2012-07-25 ENCOUNTER — Other Ambulatory Visit: Payer: Self-pay

## 2012-09-25 ENCOUNTER — Telehealth: Payer: Self-pay | Admitting: Internal Medicine

## 2012-09-25 NOTE — Telephone Encounter (Signed)
Pt called and wanted to change her pharmacy on file to [facility] on [address] and also to see if dr hopper knew were she could get her test strips cheaper for her meter. thanks

## 2012-09-28 NOTE — Telephone Encounter (Signed)
Patient informed she needs to check with the pharmacies and see which meter and supplies better fit into her budget and we can forward rx. Patient will get insurance in October and will then be able to get supplies through insurance. Patient verbalized understanding and will follow-up with [place] on the name of meter and supplies she would like sent to pharmacy.

## 2012-11-23 ENCOUNTER — Encounter: Payer: Self-pay | Admitting: Internal Medicine

## 2012-11-23 DIAGNOSIS — R946 Abnormal results of thyroid function studies: Secondary | ICD-10-CM

## 2012-11-23 NOTE — Telephone Encounter (Signed)
[facility] please advise  

## 2012-11-24 ENCOUNTER — Other Ambulatory Visit (INDEPENDENT_AMBULATORY_CARE_PROVIDER_SITE_OTHER): Payer: Self-pay

## 2012-11-24 DIAGNOSIS — R946 Abnormal results of thyroid function studies: Secondary | ICD-10-CM

## 2012-11-24 LAB — T4, FREE: Free T4: 1.17 ng/dL (ref 0.60–1.60)

## 2012-11-24 LAB — T3, FREE: T3, Free: 2.7 pg/mL (ref 2.3–4.2)

## 2012-11-25 ENCOUNTER — Encounter: Payer: Self-pay | Admitting: Internal Medicine

## 2012-11-26 ENCOUNTER — Encounter: Payer: Self-pay | Admitting: Internal Medicine

## 2013-01-07 ENCOUNTER — Encounter: Payer: Self-pay | Admitting: Gastroenterology

## 2013-03-08 ENCOUNTER — Encounter: Payer: Self-pay | Admitting: Internal Medicine

## 2013-03-10 ENCOUNTER — Encounter: Payer: Self-pay | Admitting: Internal Medicine

## 2013-03-10 ENCOUNTER — Telehealth: Payer: Self-pay | Admitting: Internal Medicine

## 2013-03-10 NOTE — Telephone Encounter (Signed)
Patient is calling requesting a refill for test strips with a diagnosis code written on it.  Also patient wants to know how can  medicare patients get test strips for free.     Pharmacy [facility] @ [address]

## 2013-03-11 ENCOUNTER — Ambulatory Visit: Payer: Self-pay

## 2013-03-12 ENCOUNTER — Other Ambulatory Visit: Payer: Self-pay | Admitting: *Deleted

## 2013-03-12 MED ORDER — GLUCOSE BLOOD VI STRP: ORAL_STRIP | Status: DC

## 2013-03-12 NOTE — Telephone Encounter (Signed)
Test strips sent for refill

## 2013-03-15 NOTE — Telephone Encounter (Signed)
Spoke with the pt and informed her that her test strips have been sent to [facility].  Pt stated that she will ask [facility] to tranfer the rx to [facility].  Informed the pt that she will need to connect her ins. Company regarding the cost on the test strips.  She stated that she showed [facility] her Rx card and they told her that her Medicare part D should take care of her strips.  Asked the pt if she showed [facility] the rx card and she stated no.  Informed the pt to show [facility] the rx card and see what they say, and to tell them what [facility] informed her.  Pt stated that she will go to [facility] and let them know.//AB/CMA

## 2013-03-16 ENCOUNTER — Ambulatory Visit (INDEPENDENT_AMBULATORY_CARE_PROVIDER_SITE_OTHER): Payer: Medicare Other

## 2013-03-16 ENCOUNTER — Encounter: Payer: Self-pay | Admitting: Internal Medicine

## 2013-03-16 ENCOUNTER — Telehealth: Payer: Self-pay | Admitting: Internal Medicine

## 2013-03-16 DIAGNOSIS — Z23 Encounter for immunization: Secondary | ICD-10-CM

## 2013-03-16 NOTE — Telephone Encounter (Signed)
[facility] [address] is calling about the patient's test strips. They need a new rx with the patient's dx code and precise directions written on it. The rx was transferred to them from [place] and per [facility], when rx's get transferred the dx code goes away. Please advise.

## 2013-03-17 ENCOUNTER — Other Ambulatory Visit: Payer: Self-pay | Admitting: *Deleted

## 2013-03-17 ENCOUNTER — Encounter: Payer: Self-pay | Admitting: *Deleted

## 2013-03-17 DIAGNOSIS — E119 Type 2 diabetes mellitus without complications: Secondary | ICD-10-CM

## 2013-03-17 MED ORDER — GLUCOSE BLOOD VI STRP: ORAL_STRIP | Status: DC

## 2013-03-17 NOTE — Telephone Encounter (Signed)
Patient is calling to be advised on which injection (Shingles or Pnuemonia) she is to have next and how far apart she needs to have them. See MyChart email in chart. Patient says her question she sent through MyChart was not answered. Please advise.

## 2013-03-17 NOTE — Telephone Encounter (Signed)
Prescription sent to pharmacy.

## 2013-03-22 LAB — HM PAP SMEAR

## 2013-03-23 ENCOUNTER — Telehealth: Payer: Self-pay | Admitting: Internal Medicine

## 2013-03-23 MED ORDER — LANCET DEVICE MISC: Status: DC

## 2013-03-23 MED ORDER — BLOOD GLUCOSE METER KIT: PACK | Status: DC

## 2013-03-23 NOTE — Telephone Encounter (Signed)
See MyChart message on 03-17-13.//AB/CMA

## 2013-03-23 NOTE — Telephone Encounter (Signed)
Lancets and meter orders sent to pharmacy

## 2013-03-23 NOTE — Telephone Encounter (Signed)
Patient states that she needs an rx for needles with the dx code and directions on how to administer on the script in order for her insurance to cover. She would also like an rx for a new [employer] Contour glucose meter. She uses [facility] on [address].

## 2013-03-25 ENCOUNTER — Ambulatory Visit: Payer: Self-pay

## 2013-03-26 ENCOUNTER — Ambulatory Visit (INDEPENDENT_AMBULATORY_CARE_PROVIDER_SITE_OTHER): Payer: Medicare Other | Admitting: *Deleted

## 2013-03-26 DIAGNOSIS — Z23 Encounter for immunization: Secondary | ICD-10-CM

## 2013-03-29 ENCOUNTER — Encounter: Payer: Self-pay | Admitting: Gastroenterology

## 2013-03-31 ENCOUNTER — Other Ambulatory Visit: Payer: Self-pay | Admitting: Obstetrics and Gynecology

## 2013-03-31 DIAGNOSIS — R928 Other abnormal and inconclusive findings on diagnostic imaging of breast: Secondary | ICD-10-CM

## 2013-04-01 ENCOUNTER — Ambulatory Visit: Payer: Self-pay

## 2013-04-01 ENCOUNTER — Ambulatory Visit (INDEPENDENT_AMBULATORY_CARE_PROVIDER_SITE_OTHER): Payer: Medicare Other | Admitting: *Deleted

## 2013-04-01 DIAGNOSIS — Z2911 Encounter for prophylactic immunotherapy for respiratory syncytial virus (RSV): Secondary | ICD-10-CM

## 2013-04-01 DIAGNOSIS — Z23 Encounter for immunization: Secondary | ICD-10-CM

## 2013-04-08 ENCOUNTER — Encounter: Payer: Self-pay | Admitting: Internal Medicine

## 2013-04-14 ENCOUNTER — Telehealth: Payer: Self-pay

## 2013-04-14 NOTE — Telephone Encounter (Signed)
Pt has EGG ALLERGY

## 2013-04-15 ENCOUNTER — Ambulatory Visit (AMBULATORY_SURGERY_CENTER): Payer: Self-pay

## 2013-04-15 VITALS — Ht 66.5 in | Wt 145.0 lb

## 2013-04-15 DIAGNOSIS — Z86010 Personal history of colonic polyps: Secondary | ICD-10-CM

## 2013-04-15 MED ORDER — MOVIPREP 100 G PO SOLR: ORAL | Status: DC

## 2013-04-15 NOTE — Telephone Encounter (Signed)
Lisa Mcgee  We will use versed/fentanyl for this patient  Thanks,  Jonny Ruiz

## 2013-04-16 ENCOUNTER — Encounter: Payer: Self-pay | Admitting: Gastroenterology

## 2013-04-17 ENCOUNTER — Encounter: Payer: Self-pay | Admitting: Internal Medicine

## 2013-04-17 DIAGNOSIS — M8580 Other specified disorders of bone density and structure, unspecified site: Secondary | ICD-10-CM | POA: Insufficient documentation

## 2013-04-20 ENCOUNTER — Ambulatory Visit
Admission: RE | Admit: 2013-04-20 | Discharge: 2013-04-20 | Disposition: A | Discharge status: Home / Self Care | Payer: Medicare Other | Source: Ambulatory Visit | Attending: Obstetrics and Gynecology | Admitting: Obstetrics and Gynecology

## 2013-04-20 DIAGNOSIS — R928 Other abnormal and inconclusive findings on diagnostic imaging of breast: Secondary | ICD-10-CM

## 2013-04-26 ENCOUNTER — Encounter: Payer: Self-pay | Admitting: Gastroenterology

## 2013-05-05 ENCOUNTER — Ambulatory Visit (AMBULATORY_SURGERY_CENTER): Payer: Medicare Other | Admitting: Gastroenterology

## 2013-05-05 ENCOUNTER — Encounter: Payer: Self-pay | Admitting: Gastroenterology

## 2013-05-05 VITALS — BP 119/63 | HR 67 | Temp 98.0°F | Resp 14 | Ht 66.0 in | Wt 145.0 lb

## 2013-05-05 DIAGNOSIS — Z86010 Personal history of colonic polyps: Secondary | ICD-10-CM

## 2013-05-05 LAB — HM COLONOSCOPY

## 2013-05-05 MED ORDER — SODIUM CHLORIDE 0.9 % IV SOLN: 500.0000 mL | INTRAVENOUS | Status: DC

## 2013-05-05 NOTE — Progress Notes (Signed)
Patient did not experience any of the following events: a burn prior to discharge; a fall within the facility; wrong site/side/patient/procedure/implant event; or a hospital transfer or hospital admission upon discharge from the facility. (G8907) Patient did not have preoperative order for IV antibiotic SSI prophylaxis. (G8918)  

## 2013-05-05 NOTE — Op Note (Signed)
[facility] [address] [place], [address]   COLONOSCOPY PROCEDURE REPORT  PATIENT: Lisa Mcgee, Lisa Mcgee  MR#: [id number] BIRTHDATE: [birth year] , 67  yrs. old GENDER: Female ENDOSCOPIST: Mardella Layman, MD, [facility] REFERRED BY: PROCEDURE DATE:  05/05/2013 PROCEDURE:   Colonoscopy, screening First Screening Colonoscopy - Avg.  risk and is 50 yrs.  old or older - No. ASA CLASS:   Class II INDICATIONS:average risk screening. MEDICATIONS: propofol (Diprivan) 250mg  IV  DESCRIPTION OF PROCEDURE:   After the risks benefits and alternatives of the procedure were thoroughly explained, informed consent was obtained.  A digital rectal exam revealed no abnormalities of the rectum.   The LB [id number]  endoscope was introduced through the anus and advanced to the cecum, which was identified by both the appendix and ileocecal valve. No adverse events experienced.   Limited by a tortuous and redundant colon. The quality of the prep was excellent, using MoviPrep  The instrument was then slowly withdrawn as the colon was fully examined.      COLON FINDINGS: A normal appearing cecum, ileocecal valve, and appendiceal orifice were identified.  The ascending, hepatic flexure, transverse, splenic flexure, descending, sigmoid colon and rectum appeared unremarkable.  No polyps or cancers were seen. Retroflexed views revealed no abnormalities. The time to cecum=8 minutes 32 seconds.  Withdrawal time=6 minutes 1 seconds.  The scope was withdrawn and the procedure completed. COMPLICATIONS: There were no complications.  ENDOSCOPIC IMPRESSION: Normal colon  RECOMMENDATIONS: Continue current colorectal screening recommendations for "routine risk" patients with a repeat colonoscopy in 10 years.   eSigned:  Mardella Layman, MD, [facility] 05/05/2013 8:50 AM   cc:

## 2013-05-05 NOTE — Patient Instructions (Signed)

## 2013-05-10 ENCOUNTER — Telehealth: Payer: Self-pay | Admitting: *Deleted

## 2013-05-10 NOTE — Telephone Encounter (Signed)
  Follow up Call-  Call back number 05/05/2013  Post procedure Call Back phone  # [phone]  Permission to leave phone message Yes     Patient questions:  Do you have a fever, pain , or abdominal swelling? no Pain Score  0 *  Have you tolerated food without any problems? yes  Have you been able to return to your normal activities? yes  Do you have any questions about your discharge instructions: Diet   no Medications  no Follow up visit  no  Do you have questions or concerns about your Care? no  Actions: * If pain score is 4 or above: No action needed, pain <4.

## 2013-05-13 ENCOUNTER — Encounter: Payer: Self-pay | Admitting: *Deleted

## 2013-06-14 ENCOUNTER — Telehealth: Payer: Self-pay

## 2013-06-14 NOTE — Telephone Encounter (Addendum)
Left message for call back  identifiable Medication and allergies:  Reviewed and updated  90 day supply/mail order: na Local pharmacy: [facility]   Immunizations due:  UTD  A/P:   No changes to [place] or PSH or personal hx Pap--03/2013--neg CCS--04/2013--Dr Patterson--next due 2024 MMG--04/2013--incomplete (Left breast calcifications) Flu vaccine--04/2013 Tdap--04/2008 PNA--03/2013 Shingles--03/2013  To Discuss with Provider: Skin cream Left foot bed lump

## 2013-06-15 ENCOUNTER — Telehealth: Payer: Self-pay | Admitting: *Deleted

## 2013-06-15 ENCOUNTER — Encounter: Payer: Self-pay | Admitting: Internal Medicine

## 2013-06-15 ENCOUNTER — Other Ambulatory Visit: Payer: Self-pay | Admitting: *Deleted

## 2013-06-15 ENCOUNTER — Ambulatory Visit (INDEPENDENT_AMBULATORY_CARE_PROVIDER_SITE_OTHER): Payer: Medicare HMO | Admitting: Internal Medicine

## 2013-06-15 VITALS — BP 116/60 | HR 71 | Temp 98.0°F | Ht 67.0 in | Wt 141.0 lb

## 2013-06-15 DIAGNOSIS — Z0000 Encounter for general adult medical examination without abnormal findings: Secondary | ICD-10-CM

## 2013-06-15 DIAGNOSIS — E785 Hyperlipidemia, unspecified: Secondary | ICD-10-CM

## 2013-06-15 DIAGNOSIS — Z860100 Personal history of colon polyps, unspecified: Secondary | ICD-10-CM

## 2013-06-15 DIAGNOSIS — M6740 Ganglion, unspecified site: Secondary | ICD-10-CM

## 2013-06-15 DIAGNOSIS — E119 Type 2 diabetes mellitus without complications: Secondary | ICD-10-CM

## 2013-06-15 LAB — BASIC METABOLIC PANEL
BUN: 13 mg/dL (ref 6–23)
CALCIUM: 9.1 mg/dL (ref 8.4–10.5)
CO2: 27 mEq/L (ref 19–32)
Chloride: 104 mEq/L (ref 96–112)
Creatinine, Ser: 0.8 mg/dL (ref 0.4–1.2)
GFR: 77.57 mL/min (ref >60.00)
GLUCOSE: 106 mg/dL — AB (ref 70–99)
Potassium: 4 mEq/L (ref 3.5–5.1)
Sodium: 139 mEq/L (ref 135–145)

## 2013-06-15 LAB — LIPID PANEL
CHOL/HDL RATIO: 3
CHOLESTEROL: 134 mg/dL (ref 0–200)
HDL: 52.2 mg/dL (ref >39.00)
LDL CALC: 76 mg/dL (ref 0–99)
Triglycerides: 29 mg/dL (ref 0.0–149.0)
VLDL: 5.8 mg/dL (ref 0.0–40.0)

## 2013-06-15 LAB — HEPATIC FUNCTION PANEL
ALK PHOS: 60 U/L (ref 39–117)
ALT: 19 U/L (ref 0–35)
AST: 25 U/L (ref 0–37)
Albumin: 4.2 g/dL (ref 3.5–5.2)
BILIRUBIN DIRECT: 0.1 mg/dL (ref 0.0–0.3)
Total Bilirubin: 0.4 mg/dL (ref 0.3–1.2)
Total Protein: 7.4 g/dL (ref 6.0–8.3)

## 2013-06-15 LAB — CBC WITH DIFFERENTIAL/PLATELET
BASOS ABS: 0 10*3/uL (ref 0.0–0.1)
Basophils Relative: 0.5 % (ref 0.0–3.0)
Eosinophils Absolute: 0.1 10*3/uL (ref 0.0–0.7)
Eosinophils Relative: 1.6 % (ref 0.0–5.0)
HEMATOCRIT: 38.5 % (ref 36.0–46.0)
Hemoglobin: 13.5 g/dL (ref 12.0–15.0)
LYMPHS PCT: 32 % (ref 12.0–46.0)
Lymphs Abs: 2.2 10*3/uL (ref 0.7–4.0)
MCHC: 35 g/dL (ref 30.0–36.0)
MCV: 84.8 fl (ref 78.0–100.0)
Monocytes Absolute: 0.5 10*3/uL (ref 0.1–1.0)
Monocytes Relative: 7.1 % (ref 3.0–12.0)
NEUTROS PCT: 58.8 % (ref 43.0–77.0)
Neutro Abs: 4 10*3/uL (ref 1.4–7.7)
PLATELETS: 158 10*3/uL (ref 150.0–400.0)
RBC: 4.54 Mil/uL (ref 3.87–5.11)
RDW: 13.5 % (ref 11.5–14.6)
WBC: 6.8 10*3/uL (ref 4.5–10.5)

## 2013-06-15 LAB — MICROALBUMIN / CREATININE URINE RATIO
Creatinine,U: 73.7 mg/dL
MICROALB UR: 0.2 mg/dL (ref 0.0–1.9)
Microalb Creat Ratio: 0.3 mg/g (ref 0.0–30.0)

## 2013-06-15 LAB — HEMOGLOBIN A1C: HEMOGLOBIN A1C: 5.8 % (ref 4.6–6.5)

## 2013-06-15 LAB — TSH: TSH: 0.67 u[IU]/mL (ref 0.35–5.50)

## 2013-06-15 MED ORDER — GLUCOSE BLOOD VI STRP: ORAL_STRIP | Status: DC

## 2013-06-15 MED ORDER — LANCET DEVICE MISC: Status: DC

## 2013-06-15 NOTE — Telephone Encounter (Signed)
Pharmacist called and had questions about Triamcinolone with it being a compound.

## 2013-06-15 NOTE — Progress Notes (Signed)
Pre-visit discussion using our clinic review tool. No additional management support is needed unless otherwise documented below in the visit note.  

## 2013-06-15 NOTE — Progress Notes (Signed)
Subjective:    Patient ID: Lisa Mcgee, female    DOB: 1945-10-29, 68 y.o.   MRN: [id number]  HPI Medicare Wellness Visit: Psychosocial and medical history were reviewed as required by Medicare (history related to abuse, antisocial behavior , firearm risk). Social history: Caffeine: no , Alcohol: no , Tobacco IRC:VELFY Exercise:30 minutes daily X 5 as water aerobics Personal safety/fall risk:no Limitations of activities of daily living:no Seatbelt/ smoke alarm use:yes Healthcare Power of Attorney/Living Will status: needed Ophthalmologic exam status:UTD Hearing evaluation status:not current Orientation: Oriented X3 Memory and recall: good Spelling  testing: good Depression/anxiety assessment: no Foreign travel [place] Immunization status for influenza/pneumonia/ shingles /tetanus:UTD Transfusion history:no Preventive health care maintenance status: Colonoscopy/BMD/mammogram/Pap as per protocol/standard care:UTD; repeat mammograms in 6 mos Dental care:every 6 months Chart reviewed and updated. Active issues reviewed and addressed as documented below.    Review of Systems Diabetes status assessment: Fasting or morning glucose range 102-123. Highest glucose 2 hours after any meal is not monitored < 130. No hypoglycemia reported                                                          Diet : modified heart healthy / gluten free No medications by Rx. Eye exam current. Foot care not current  No excess thirst ;  excess hunger ; or excess urination reported                              No lightheadedness with standing reported No chest pain ; palpitations ; claudication described .                                                                                                                             No non healing skin  ulcers or sores of extremities noted. No numbness or tingling or burning in feet described                                                                                                                                              Change in weight of 7 #  pound gain . No blurred,double, or loss of vision reported  .         Objective:   Physical Exam Gen.: Thin but healthy and well-nourished in appearance. Alert, appropriate and cooperative throughout exam.  Head: Normocephalic without obvious abnormalities  Eyes: No corneal or conjunctival inflammation noted. Pupils equal round reactive to light and accommodation. Extraocular motion intact.  Ears: External  ear exam reveals no significant lesions or deformities. Canals clear .TMs normal. Hearing is grossly normal bilaterally. Nose: External nasal exam reveals no deformity or inflammation. Nasal mucosa are pink and moist. No lesions or exudates noted.  Mouth: Oral mucosa and oropharynx reveal no lesions or exudates. Teeth in good repair. Neck: No deformities, masses, or tenderness noted. Range of motion slightly decreased. Thyroid normal. Lungs: Normal respiratory effort; chest expands symmetrically. Lungs are clear to auscultation without rales, wheezes, or increased work of breathing. Heart: Normal rate and rhythm. Normal S1 and S2. No gallop, click, or rub. S4 w/o murmur. Abdomen: Bowel sounds normal; abdomen soft and nontender. No masses, organomegaly or hernias noted. Aorta palpable ; no AAA Genitalia:  as per Gyn                                  Musculoskeletal/extremities: No deformity or scoliosis noted of  the thoracic or lumbar spine.   No clubbing, cyanosis, edema, or significant extremity  deformity noted. Range of motion normal .Tone & strength normal. Hand joints normal . Fingernail / toenail health good.Ganglion cyst L arch Able to lie down & sit up w/o help. Negative SLR bilaterally Vascular: Carotid, radial artery, dorsalis pedis and  posterior tibial pulses are full and equal. No bruits present. Neurologic: Alert and oriented x3. Deep tendon reflexes symmetrical and  normal.        Skin: Intact without suspicious lesions or rashes. Lymph: No cervical, axillary lymphadenopathy present. Psych: Mood and affect are normal. Normally interactive                                                                                        Assessment & Plan:  #1 Medicare Wellness Exam; criteria met ; data entered #2 Problem List/Diagnoses reviewed Plan:  Assessments made/ Orders entered

## 2013-06-15 NOTE — Telephone Encounter (Signed)
Please advise 

## 2013-06-15 NOTE — Patient Instructions (Addendum)
Your next office appointment will be determined based upon review of your pending labs . Those instructions will be transmitted to you through My Chart  Please do not use Q-tips as we discussed. Should wax build up occur, please put 2-3 drops of mineral oil in the affected  ear at night to soften the wax .Cover the canal with a  cotton ball to prevent the oil from staining bed linens. In the morning fill the ear canal with hydrogen peroxide & lie in the opposite lateral decubitus position(on the side opposite the affected ear)  for 10-15 minutes. After allowing this period of time for the peroxide to dissolve the wax ;shower and use the thinnest washrag available to wick out the wax. If both ears are involved ; alternate this treatment from ear to ear each night until no wax is found on the washrag. 

## 2013-06-15 NOTE — Telephone Encounter (Signed)
She needs to take prior labeled Rx from Dr Sallye Lat; hepatic copied off his specific directions.

## 2013-06-16 ENCOUNTER — Telehealth: Payer: Self-pay

## 2013-06-16 NOTE — Telephone Encounter (Signed)
Rx clarified to dispense as written before.

## 2013-06-16 NOTE — Telephone Encounter (Signed)
Relevant patient education assigned to patient using Emmi. ° °

## 2013-06-17 NOTE — Telephone Encounter (Signed)
Informed pt by MyChart that Dr. Park Meo ordering the Vit. D level with the TG:PQDIYMEBRA(309.40).//HW/KGS

## 2013-06-17 NOTE — Telephone Encounter (Signed)
Patient called and wanted to know if she were to have her Vitamin D checked which code would dr hopper use for her 82.306, 826.52?

## 2013-06-22 ENCOUNTER — Ambulatory Visit (INDEPENDENT_AMBULATORY_CARE_PROVIDER_SITE_OTHER): Payer: Medicare HMO | Admitting: Family Medicine

## 2013-06-22 ENCOUNTER — Telehealth: Payer: Self-pay | Admitting: *Deleted

## 2013-06-22 ENCOUNTER — Encounter: Payer: Self-pay | Admitting: Family Medicine

## 2013-06-22 ENCOUNTER — Other Ambulatory Visit (INDEPENDENT_AMBULATORY_CARE_PROVIDER_SITE_OTHER): Payer: Medicare HMO

## 2013-06-22 VITALS — BP 120/68 | HR 74 | Temp 98.3°F | Resp 16 | Wt 144.6 lb

## 2013-06-22 DIAGNOSIS — M79609 Pain in unspecified limb: Secondary | ICD-10-CM

## 2013-06-22 DIAGNOSIS — M6740 Ganglion, unspecified site: Secondary | ICD-10-CM

## 2013-06-22 DIAGNOSIS — M79672 Pain in left foot: Secondary | ICD-10-CM

## 2013-06-22 DIAGNOSIS — M67472 Ganglion, left ankle and foot: Secondary | ICD-10-CM | POA: Insufficient documentation

## 2013-06-22 HISTORY — DX: Ganglion, left ankle and foot: M67.472

## 2013-06-22 MED ORDER — CEPHALEXIN 500 MG PO CAPS: 500.0000 mg | ORAL_CAPSULE | Freq: Two times a day (BID) | ORAL | Status: DC

## 2013-06-22 NOTE — Progress Notes (Signed)
  I'm seeing this patient by the request  of:  Unice Cobble, MD  CC: Left foot mass  HPI: Patient is a very pleasant 68 year old female coming in with a lap foot mass. Patient states it is on the plantar aspect of her foot and was told that it was a cyst by her primary care provider. Patient is here for evaluation and possible treatment. Patient denies any significant pain, denies any numbness. Patient states that it's probably been the years but seems to have increased in size over the course of the last several months. Patient states it is not affecting her activities of daily living patient would just like evaluation. Patient was the severity a 0/10   Past medical, surgical, family and social history reviewed. Medications reviewed all in the electronic medical record.   Review of Systems: No headache, visual changes, nausea, vomiting, diarrhea, constipation, dizziness, abdominal pain, skin rash, fevers, chills, night sweats, weight loss, swollen lymph nodes, body aches, joint swelling, muscle aches, chest pain, shortness of breath, mood changes.   Objective:    Blood pressure 120/68, pulse 74, temperature 98.3 F (36.8 C), temperature source Oral, resp. rate 16, weight 144 lb 9.6 oz (65.59 kg), SpO2 97.00%.   General: No apparent distress alert and oriented x3 mood and affect normal, dressed appropriately.  HEENT: Pupils equal, extraocular movements intact Respiratory: Patient's speak in full sentences and does not appear short of breath Cardiovascular: No lower extremity edema, non tender, no erythema Skin: Warm dry intact with no signs of infection or rash on extremities or on axial skeleton. Abdomen: Soft nontender Neuro: Cranial nerves II through XII are intact, neurovascularly intact in all extremities with 2+ DTRs and 2+ pulses. Lymph: No lymphadenopathy of posterior or anterior cervical chain or axillae bilaterally.  Gait normal with good balance and coordination.  MSK: Non  tender with full range of motion and good stability and symmetric strength and tone of shoulders, elbows, wrist, hip, knee and ankles bilaterally.  Foot exam shows the patient has pes cavus bilaterally. Patient left foot superficial to the fascia does have an area that has a cystic structure measures approximately 1 cm in diameter. This is nontender on exam. She is neurovascularly intact distally.  Limited muscular skeletal ultrasound was performed and interpreted by Hulan Saas, M Limited muscular skeletal ultrasound shows the patient cystic structure on the plantar aspect of the foot measures approximately 1.3 cm in diameter. This does appear to have fluid in the area but not hypoechoic. This is somewhat compressible. This is not a soft tissue mass. This is superficial to the fascia.  Impression: Ganglion cyst of the plantar aspect of left foot  After verbal consent patient was prepped with alcohol swabs and with a 27-gauge 1-1/2 inch needle had 3 cc of 0.5% Marcaine injected. Patient then had an 18-gauge 1-1/2 inch needle inserted and we did have jellylike substance removed as well as a mild blood. With compression force we were able to get out approximately 6 cc of the jellylike fluid. 1 cc of Kenalog 40 mg/dL was then injected. Patient then was prepped with a pressure dressing and post injection and aspiration instructions given.  Impression and Recommendations:     This case required medical decision making of moderate complexity.

## 2013-06-22 NOTE — Patient Instructions (Signed)
Very nice to meet you You do have a ganglion cyst You will be sore in 6 hours  Ice 20 minutes 2 times a day Spenco orthotics at [employer] or [employer] can help.  Cost about $30.  Bronwen Betters or Dansko would be best shoes for you 40% can come back so would see you again in 1-2 months if giving you troble Keflex if you notice any redness.

## 2013-06-22 NOTE — Assessment & Plan Note (Signed)
Aspirated under ultrasound guidance today. Patient tolerated the procedure fairly well. Patient did have a lot of scarring of the sac of the cyst. Discussed with patient that 40% of these can recur. Patient was concerned about potential post injection infection so a antibiotic was given the patient a secondary to her anxiety. Patient can follow up in 1-2 months if this seems to reaccumulate and we can try to make a larger incision to allow for better drainage. As long as it does not seem to hurt patient though I would continue with aspiration by needle. We did discuss different shoe wear as well as orthotics that could be beneficial to relieve the chronic distress secondary to her high arches.

## 2013-06-22 NOTE — Telephone Encounter (Signed)
Patient called and stated that she had a pap smear done on 03/22/2013 but my chart is stating that she is due. Also patient stated that she e-mail [place] back on my chart regarding her vitamin d level and no one has gotten back with her.

## 2013-06-25 ENCOUNTER — Other Ambulatory Visit: Payer: Medicare HMO

## 2013-06-28 ENCOUNTER — Other Ambulatory Visit (INDEPENDENT_AMBULATORY_CARE_PROVIDER_SITE_OTHER): Payer: Medicare HMO

## 2013-06-28 ENCOUNTER — Other Ambulatory Visit: Payer: Medicare HMO

## 2013-06-28 DIAGNOSIS — Z0000 Encounter for general adult medical examination without abnormal findings: Secondary | ICD-10-CM

## 2013-07-02 LAB — VITAMIN D 1,25 DIHYDROXY
Vitamin D 1, 25 (OH)2 Total: 29 pg/mL (ref 18–72)
Vitamin D3 1, 25 (OH)2: 29 pg/mL

## 2013-07-07 ENCOUNTER — Encounter: Payer: Self-pay | Admitting: Internal Medicine

## 2013-08-04 ENCOUNTER — Encounter: Payer: Self-pay | Admitting: Internal Medicine

## 2013-08-04 DIAGNOSIS — E559 Vitamin D deficiency, unspecified: Secondary | ICD-10-CM | POA: Insufficient documentation

## 2013-08-24 ENCOUNTER — Encounter: Payer: Self-pay | Admitting: Internal Medicine

## 2013-08-24 ENCOUNTER — Telehealth: Payer: Self-pay | Admitting: *Deleted

## 2013-08-24 NOTE — Telephone Encounter (Signed)
Pt called states she has an appoint 3.18.15 with PCP for her foot pain, requesting whether she should see PCP or go to [facility].  Please advise

## 2013-08-24 NOTE — Telephone Encounter (Signed)
Specialists & insurance organizations  require an updated, current  assessment and written note from the Primary Care physician  to review before they  schedule an appointment to assess symptoms or problems. As we do not have such  a current  assessment of your foot issue or complaint in the chart (electronic medical record);you should keep appointment to create this document THEY REQUIRE. It will be necessary to know prior evaluations and treatments of this symptom and response to these interventions. Please bring that medical history & all medications & supplements to that appointment so I can complete the required document.

## 2013-08-24 NOTE — Telephone Encounter (Signed)
Spoke with pt advised of MDs message 

## 2013-08-25 ENCOUNTER — Encounter: Payer: Self-pay | Admitting: Internal Medicine

## 2013-08-25 ENCOUNTER — Ambulatory Visit (INDEPENDENT_AMBULATORY_CARE_PROVIDER_SITE_OTHER): Payer: Medicare HMO | Admitting: Internal Medicine

## 2013-08-25 VITALS — BP 130/60 | HR 74 | Temp 98.6°F | Wt 149.0 lb

## 2013-08-25 DIAGNOSIS — L538 Other specified erythematous conditions: Secondary | ICD-10-CM

## 2013-08-25 DIAGNOSIS — L26 Exfoliative dermatitis: Secondary | ICD-10-CM

## 2013-08-25 MED ORDER — KETOCONAZOLE 2 % EX CREA: 1.0000 "application " | TOPICAL_CREAM | Freq: Two times a day (BID) | CUTANEOUS | Status: DC | PRN

## 2013-08-25 NOTE — Progress Notes (Signed)
Pre visit review using our clinic review tool, if applicable. No additional management support is needed unless otherwise documented below in the visit note. 

## 2013-08-25 NOTE — Patient Instructions (Signed)
Keep the  area as dry as possible. Apply Nizoral twice a day until rash is gone.

## 2013-08-25 NOTE — Progress Notes (Signed)
   Subjective:    Patient ID: Lisa Mcgee, female    DOB: 1946-04-19, 68 y.o.   MRN: [id number]  HPI she has developed a localized area of pruritic, exfoliative dermatitis over the mid sole of the left foot. This is in the context of having engaged in water aerobics 2 days a week over the last 3-4 weeks.  This is in the area where the ganglion cyst was ruptured by Dr. Tamala Julian 06/22/13. After the procedure she had no localized cellulitis or pustule formation. She has no fever, chills, sweats, weight loss.     Review of Systems  She has had no other associated rashes other than some eczematoid rash for which she sees Dr. Radford Pax  Specifically she's had no inguinal area or inframammary rash.  Her blood glucoses are well controlled.      Objective:   Physical Exam  She appears healthy and well-nourished in no distress  There are scattered faint flat erythematous lesions on lower extremities which were diagnosed as "dry patches" She has a 1.5 x 2 cm exfoliative, dry slightly erythematous lesion over the mid medial arch of the left foot  Her toenails are healthy.  Pedal pulses are equal but decreased. She has no ischemic changes over the feet.        Assessment & Plan:  #1 dermatitis L sole after water aerobic exposure  See orders

## 2013-09-13 ENCOUNTER — Other Ambulatory Visit: Payer: Self-pay | Admitting: Obstetrics and Gynecology

## 2013-09-13 DIAGNOSIS — R921 Mammographic calcification found on diagnostic imaging of breast: Secondary | ICD-10-CM

## 2013-10-08 ENCOUNTER — Ambulatory Visit (INDEPENDENT_AMBULATORY_CARE_PROVIDER_SITE_OTHER): Payer: Medicare HMO | Admitting: Internal Medicine

## 2013-10-08 ENCOUNTER — Encounter: Payer: Self-pay | Admitting: Internal Medicine

## 2013-10-08 VITALS — BP 120/60 | HR 68 | Temp 98.5°F | Resp 13 | Wt 147.2 lb

## 2013-10-08 DIAGNOSIS — J309 Allergic rhinitis, unspecified: Secondary | ICD-10-CM

## 2013-10-08 DIAGNOSIS — R059 Cough, unspecified: Secondary | ICD-10-CM

## 2013-10-08 DIAGNOSIS — R05 Cough: Secondary | ICD-10-CM

## 2013-10-08 MED ORDER — MONTELUKAST SODIUM 10 MG PO TABS: 10.0000 mg | ORAL_TABLET | Freq: Every day | ORAL | Status: DC

## 2013-10-08 MED ORDER — AMOXICILLIN 500 MG PO CAPS: 500.0000 mg | ORAL_CAPSULE | Freq: Three times a day (TID) | ORAL | Status: DC

## 2013-10-08 NOTE — Patient Instructions (Signed)

## 2013-10-08 NOTE — Progress Notes (Signed)
   Subjective:    Patient ID: Lisa Mcgee, female    DOB: 12-12-1945, 68 y.o.   MRN: [id number]  HPI   Her symptoms began 10/05/13 as sore throat, rhinitis, head congestion, and nonproductive cough. She also had itchy, watery eyes. She has felt hot but did not take her temperature. She's had chills and sweats.  She has developed a cough which is productive of clear sputum.  Her husband has has had a similar process.  She does use Flonase, Zyrtec, Mucinex with only partial response  She continues to have frontal headache, facial pain, dental pain, sore throat, earache, dry cough, and extrinsic symptoms.   Review of Systems She is not having any otic  discharge, purulent nasal secretions, purulent sputum, wheezing, shortness of breath.      Objective:   Physical Exam General appearance:good health ;well nourished; no acute distress or increased work of breathing is present.  No  lymphadenopathy about the head, neck, or axilla noted.   Eyes: No conjunctival inflammation or lid edema is present. There is no scleral icterus.  Ears:  External ear exam shows no significant lesions or deformities.  Otoscopic examination reveals clear canals, tympanic membranes are intact bilaterally without bulging, retraction, inflammation or discharge. L TM dull  Nose:  External nasal examination shows no deformity or inflammation. Nasal mucosa are pink and moist without lesions or exudates. No septal dislocation or deviation.No obstruction to airflow.   Oral exam: Dental hygiene is good; lips and gums are healthy appearing.There is no oropharyngeal erythema or exudate noted.   Neck:  No deformities, thyromegaly, masses, or tenderness noted.   Supple with full range of motion without pain.   Heart:  Normal rate and regular rhythm. S1 and S2 normal without gallop, murmur, click, rub or other extra sounds.   Lungs:Chest clear to auscultation;rare isolated  Wheezes. No rhonchi,rales ,or rubs present.No  increased work of breathing.    Extremities:  No cyanosis, edema, or clubbing  noted    Skin: Warm & slightly damp         Assessment & Plan:  #1 allergic rhinitis See AVS

## 2013-10-08 NOTE — Progress Notes (Signed)
Pre visit review using our clinic review tool, if applicable. No additional management support is needed unless otherwise documented below in the visit note. 

## 2013-10-19 ENCOUNTER — Ambulatory Visit
Admission: RE | Admit: 2013-10-19 | Discharge: 2013-10-19 | Disposition: A | Discharge status: Home / Self Care | Payer: Medicare HMO | Source: Ambulatory Visit | Attending: Obstetrics and Gynecology | Admitting: Obstetrics and Gynecology

## 2013-10-19 DIAGNOSIS — R921 Mammographic calcification found on diagnostic imaging of breast: Secondary | ICD-10-CM

## 2014-03-01 ENCOUNTER — Ambulatory Visit (INDEPENDENT_AMBULATORY_CARE_PROVIDER_SITE_OTHER): Payer: Medicare HMO

## 2014-03-01 DIAGNOSIS — Z23 Encounter for immunization: Secondary | ICD-10-CM

## 2014-03-11 ENCOUNTER — Ambulatory Visit: Payer: Medicare HMO

## 2014-03-21 ENCOUNTER — Other Ambulatory Visit: Payer: Self-pay | Admitting: Obstetrics and Gynecology

## 2014-03-21 DIAGNOSIS — R921 Mammographic calcification found on diagnostic imaging of breast: Secondary | ICD-10-CM

## 2014-03-23 ENCOUNTER — Other Ambulatory Visit: Payer: Self-pay | Admitting: Obstetrics and Gynecology

## 2014-03-24 LAB — CYTOLOGY - PAP

## 2014-03-30 ENCOUNTER — Encounter: Payer: Self-pay | Admitting: Family Medicine

## 2014-03-30 ENCOUNTER — Other Ambulatory Visit (INDEPENDENT_AMBULATORY_CARE_PROVIDER_SITE_OTHER): Payer: Medicare HMO

## 2014-03-30 ENCOUNTER — Ambulatory Visit (INDEPENDENT_AMBULATORY_CARE_PROVIDER_SITE_OTHER): Payer: Medicare HMO | Admitting: Family Medicine

## 2014-03-30 VITALS — BP 150/66 | HR 78 | Ht 67.0 in | Wt 143.0 lb

## 2014-03-30 DIAGNOSIS — M67472 Ganglion, left ankle and foot: Secondary | ICD-10-CM

## 2014-03-30 NOTE — Progress Notes (Signed)
   CC: Left foot mass follow up  HPI: Patient is a very pleasant 68 year old female coming in with a left foot mass. Patient was seen 9 months ago and had what seemed to be a ganglion cyst on the plantar aspect of her foot. Patient did have aspiration and injection. Patient states that didn't seem very well for multiple months but then over the course of the last several months started having increasing pain again. Patient states when she tries to workout he does have some difficulty denies any numbness or tingling. Patient states that it feels fine when she is not staining noted. Patient has not tried any new shoes which he knows would've been beneficial.   Past medical, surgical, family and social history reviewed. Medications reviewed all in the electronic medical record.   Review of Systems: No headache, visual changes, nausea, vomiting, diarrhea, constipation, dizziness, abdominal pain, skin rash, fevers, chills, night sweats, weight loss, swollen lymph nodes, body aches, joint swelling, muscle aches, chest pain, shortness of breath, mood changes.   Objective:    Blood pressure 150/66, pulse 78, height 5\' 7"  (1.702 m), weight 143 lb (64.864 kg), SpO2 98.00%.   General: No apparent distress alert and oriented x3 mood and affect normal, dressed appropriately.  HEENT: Pupils equal, extraocular movements intact Respiratory: Patient's speak in full sentences and does not appear short of breath Cardiovascular: No lower extremity edema, non tender, no erythema Skin: Warm dry intact with no signs of infection or rash on extremities or on axial skeleton. Abdomen: Soft nontender Neuro: Cranial nerves II through XII are intact, neurovascularly intact in all extremities with 2+ DTRs and 2+ pulses. Lymph: No lymphadenopathy of posterior or anterior cervical chain or axillae bilaterally.  Gait normal with good balance and coordination.  MSK: Non tender with full range of motion and good stability and  symmetric strength and tone of shoulders, elbows, wrist, hip, knee and ankles bilaterally.  Foot exam shows the patient has pes cavus bilaterally. Patient left foot superficial to the fascia does have an area that has a cystic structure measures approximately 0.7 cm in diameter this is smaller than previous exam. This is nontender on exam. She is neurovascularly intact distally.  Limited muscular skeletal ultrasound was performed and interpreted by Hulan Saas, M Limited muscular skeletal ultrasound shows the patient cystic structure on the plantar aspect of the foot measures approximately 1.3 cm in diameter. This does appear to have fluid in the area but not hypoechoic. This is somewhat compressible. This is not a soft tissue mass. This is superficial to the fascia.  Impression: Recurrent Ganglion cyst of the plantar aspect of left foot  After verbal consent patient was prepped with alcohol swabs and with a 27-gauge 1-1/2 inch needle had 3 cc of 0.5% Marcaine injected. Patient then had an 18-gauge 1-1/2 inch needle inserted and we did have jellylike substance removed as well as a mild blood. With compression force we were able to get out approximately 6 cc of the jellylike fluid. 1 cc of Kenalog 40 mg/dL was then injected. Patient then was prepped with a pressure dressing and post injection and aspiration instructions given.  Impression and Recommendations:     This case required medical decision making of moderate complexity.

## 2014-03-30 NOTE — Patient Instructions (Signed)
Good to see you We didi drain the ganglion cyst Ice is your friend.  We will make you orthotics. Bring the shoes you usually wear.  Lets say 10-14 days.

## 2014-03-30 NOTE — Assessment & Plan Note (Signed)
Injection and aspiration done again today. We discussed the importance of actually supporting her high arch. Patient will do and icing protocol complex for any signs of infection after the injection. Patient is going to come back and see me again in 2 weeks. At that time will likely need a custom orthotics for her high arches.  Spent greater than 25 minutes with patient face-to-face and had greater than 50% of counseling including as described above in assessment and plan.

## 2014-04-11 ENCOUNTER — Encounter: Payer: Self-pay | Admitting: Family Medicine

## 2014-04-11 ENCOUNTER — Ambulatory Visit (INDEPENDENT_AMBULATORY_CARE_PROVIDER_SITE_OTHER): Payer: Medicare HMO | Admitting: Family Medicine

## 2014-04-11 VITALS — BP 136/72 | HR 77 | Ht 67.0 in | Wt 142.0 lb

## 2014-04-11 DIAGNOSIS — Q667 Congenital pes cavus, unspecified foot: Secondary | ICD-10-CM | POA: Insufficient documentation

## 2014-04-11 DIAGNOSIS — M67472 Ganglion, left ankle and foot: Secondary | ICD-10-CM

## 2014-04-11 DIAGNOSIS — M216X2 Other acquired deformities of left foot: Secondary | ICD-10-CM

## 2014-04-11 NOTE — Assessment & Plan Note (Signed)
Patient does have pes cavus bilaterally that is contributing the left greater than right. I do think that a breakdown of the longitudinal and the transverse arch can be contribute. Patient was given orthotics today and will increase wear over the course of time. Discussed home exercises and showed proper technique. Patient will come back if any adjustments are needed of the orthotics.discussed if patient continues to have the ganglion cyst we may need to aspirate further.

## 2014-04-11 NOTE — Patient Instructions (Signed)
You are doing great!!! Ice at the end of the night.  When you get the orthotics wear them for 2 hours the first day then increase 1 hour daily until full day.  We will call you when they are ready.  Do exercises most days of the week.  Tennis ball to help stretch the new shoes.  Jefm Petty or new balance could be good shoes.

## 2014-04-11 NOTE — Assessment & Plan Note (Signed)
Responded fairly well to second aspiration. Will continue to monitor and hopefully support of arch with orthotics. Will continue to monitor and can aspirate if enlargement occurs.

## 2014-04-11 NOTE — Progress Notes (Signed)
   CC: Left foot ganglion cyst follow up  HPI: Patient is a very pleasant 68 year old female coming in with a left foot Patient Did have a ganglion cyst that did have aspiration again. Patient continues to have arch pain though overall. States that it is a mild discomfort. Not stopping her from any activities but is make it difficult to increase her activity due to the pain.patient states he does feel better after last aspiration to continue to have some difficulty. Has noticed that the mass is smaller. No new symptoms.   Past medical, surgical, family and social history reviewed.    Medications reviewed all in the electronic medical record.   Review of Systems: No headache, visual changes, nausea, vomiting, diarrhea, constipation, dizziness, abdominal pain, skin rash, fevers, chills, night sweats, weight loss, swollen lymph nodes, body aches, joint swelling, muscle aches, chest pain, shortness of breath, mood changes.   Objective:    Blood pressure 136/72, pulse 77, height 5\' 7"  (1.702 m), weight 142 lb (64.411 kg), SpO2 97 %.   General: No apparent distress alert and oriented x3 mood and affect normal, dressed appropriately.  HEENT: Pupils equal, extraocular movements intact Respiratory: Patient's speak in full sentences and does not appear short of breath Cardiovascular: No lower extremity edema, non tender, no erythema Skin: Warm dry intact with no signs of infection or rash on extremities or on axial skeleton. Abdomen: Soft nontender Neuro: Cranial nerves II through XII are intact, neurovascularly intact in all extremities with 2+ DTRs and 2+ pulses. Lymph: No lymphadenopathy of posterior or anterior cervical chain or axillae bilaterally.  Gait normal with good balance and coordination.  MSK: Non tender with full range of motion and good stability and symmetric strength and tone of shoulders, elbows, wrist, hip, knee and ankles bilaterally.  Foot exam shows the patient has pes cavus  bilaterally. Patient left foot superficial to the fascia does have an area that has a cystic structure measures approximately 0.3 cm in diameter this is smaller than previous exam. This is nontender on exam. She is neurovascularly intact distally.   Patient was fitted for a : standard, cushioned, semi-rigid orthotic. The orthotic was heated and afterward the patient stood on the orthotic blank positioned on the orthotic stand. The patient was positioned in subtalar neutral position and 10 degrees of ankle dorsiflexion in a weight bearing stance. After completion of molding, a stable base was applied to the orthotic blank. The blank was ground to a stable position for weight bearing. Size: 6 Base: Black EVA Additional Posting and Padding: None The patient ambulated these, and they were very comfortable.  I spent 45 minutes with this patient, greater than 50% was face-to-face time counseling regarding the below diagnosis.   Impression and Recommendations:

## 2014-04-12 ENCOUNTER — Encounter: Payer: Self-pay | Admitting: Internal Medicine

## 2014-04-20 ENCOUNTER — Ambulatory Visit
Admission: RE | Admit: 2014-04-20 | Discharge: 2014-04-20 | Disposition: A | Discharge status: Home / Self Care | Payer: Medicare HMO | Source: Ambulatory Visit | Attending: Obstetrics and Gynecology | Admitting: Obstetrics and Gynecology

## 2014-04-20 DIAGNOSIS — R921 Mammographic calcification found on diagnostic imaging of breast: Secondary | ICD-10-CM

## 2014-05-18 ENCOUNTER — Encounter: Payer: Self-pay | Admitting: Internal Medicine

## 2014-05-18 ENCOUNTER — Ambulatory Visit (INDEPENDENT_AMBULATORY_CARE_PROVIDER_SITE_OTHER): Payer: Medicare HMO | Admitting: Internal Medicine

## 2014-05-18 VITALS — BP 140/64 | HR 81 | Temp 98.2°F | Wt 144.0 lb

## 2014-05-18 DIAGNOSIS — M67472 Ganglion, left ankle and foot: Secondary | ICD-10-CM

## 2014-05-18 DIAGNOSIS — L97521 Non-pressure chronic ulcer of other part of left foot limited to breakdown of skin: Secondary | ICD-10-CM

## 2014-05-18 NOTE — Progress Notes (Signed)
   Subjective:    Patient ID: Lisa Mcgee, female    DOB: 19-Jan-1946, 68 y.o.   MRN: [id number]  HPI     She is here with breakdown of the skin at the arch of the left foot. In the same area she's had a ganglion cyst injected with steroids 1/13 /15 and 03/30/14.   As of 11/2 she's been wearing orthotics to relieve pressure on the ganglion cyst   She started going back to the pool for water aerobics the first of this month which seems to be a trigger for skin breakdown  Nizoral applied twice a day and then liquid Band-Aid neither seemed to help.  Review of Systems    She denies fever, chills, sweats, or purulence.     Objective:   Physical Exam    Positive or pertinent findings include: There is a curvilinear 4.5 cm X 2 mm skin breakdown to the epidermis with a dry base. There is no associated cellulitis or purulence  In the center of this curvilinear structure the ganglion is palpable  Pedal pulses are palpable; the left dorsalis pulse is slightly difficult to palpate. There no ischemic changes in the foot. Milta Deiters health is excellent  General appearance :adequately nourished; in no distress. Eyes: No conjunctival inflammation or scleral icterus is present. Heart:  Normal rate and regular rhythm. S1 and S2 normal without gallop, murmur, click, rub or other extra sounds   Lungs:Chest clear to auscultation; no wheezes, rhonchi,rales ,or rubs present.No increased work of breathing.  Vascular :  no bruits present. Skin:Warm & dry.  No jaundice or tenting Lymphatic: No lymphadenopathy is noted about the head, neck, axilla             Assessment & Plan:   #1 skin breakdown of the arch the left foot without associated cellulitis or purulence   2 ganglion cyst  Plan: See after visit summary

## 2014-05-18 NOTE — Progress Notes (Signed)
Pre visit review using our clinic review tool, if applicable. No additional management support is needed unless otherwise documented below in the visit note. 

## 2014-05-18 NOTE — Patient Instructions (Signed)
Keep the inguinal area as dry as possible. Apply Vitamin A& D ointment l twice a day until skin breakdown has healed. Please report warning signs as we discussed.  Worrisome would be change in color or size, increased pain, fever, or pus production.

## 2014-05-26 ENCOUNTER — Telehealth: Payer: Self-pay | Admitting: Internal Medicine

## 2014-05-26 DIAGNOSIS — E119 Type 2 diabetes mellitus without complications: Secondary | ICD-10-CM

## 2014-05-26 MED ORDER — GLUCOSE BLOOD VI STRP: ORAL_STRIP | Status: DC

## 2014-05-26 NOTE — Telephone Encounter (Signed)
Need new ICD 10 Code for test strips.  Please fax to [phone]

## 2014-05-26 NOTE — Telephone Encounter (Signed)
error 

## 2014-05-27 ENCOUNTER — Encounter: Payer: Self-pay | Admitting: Internal Medicine

## 2014-05-27 MED ORDER — MUPIROCIN 2 % EX OINT: 1.0000 "application " | TOPICAL_OINTMENT | Freq: Two times a day (BID) | CUTANEOUS | Status: DC

## 2014-05-30 ENCOUNTER — Telehealth: Payer: Self-pay | Admitting: *Deleted

## 2014-05-30 NOTE — Telephone Encounter (Signed)
[facility] [facility] - Client TELEPHONE [facility] Patient Name: Lisa Mcgee Gender: Female DOB: March 14, 1946 Age: 68 Y 2 M 11 D Return Phone Number: [phone] (Primary), [phone] (Secondary) Address: City/State/Zip: [place] Client [facility] - Client Client Site [place] - Night Physician Unice Cobble Contact Type Call Call Type Triage / Clinical Relationship To Patient Self Return Phone Number [phone] (Secondary) Chief Complaint Medication Question (non symptomatic) Initial Comment Caller states her script that she was ordered for her is an ointment for a nose issue, but the problem is her foot, not her nose.. She just wanted to verify that is what she is actually suppose to take or was there a mistake made; Mupirocin is the medication Nurse Assessment Nurse: Mallie Mussel, RN, Alveta Heimlich Date/Time Eilene Ghazi Time): 05/28/2014 9:31:10 AM Confirm and document reason for call. If symptomatic, describe symptoms. ---Caller states that she has a foot ulcer. She was seen by Dr. Linna Darner who prescribed Mupirocin but the pharmacist told her it was prescribed for her nose, not her foot. There are 2 Janese Salway's at the doctor's office. She wants to be sure that the medication that the pharmacy has is for her and not for the other [place]. Caller's address is [address]. Has the patient traveled out of the country within the last 30 days? ---Not Applicable Does the patient require triage? ---No Guidelines Guideline Title Affirmed Question Affirmed Notes Nurse Date/Time (Eastern Time) Disp. Time Eilene Ghazi Time) Disposition Final User 05/28/2014 9:36:43 AM Paged On Call back to [facility], RN, Alveta Heimlich 05/28/2014 9:59:19 AM Clinical Call Yes Mallie Mussel, RN, Alveta Heimlich After Care Instructions Given Call Event Type User Date / Time Description Comments User: Reeves Forth, RN Date/Time Eilene Ghazi Time): 05/28/2014  9:59:03 AM 0957- Caller advised of Dr. Barbie Banner instructions. Verbalized understanding. PLEASE NOTE: All timestamps contained within this report are represented as [place] Standard Time. CONFIDENTIALTY NOTICE: This fax transmission is intended only for the addressee. It contains information that is legally privileged, confidential or otherwise protected from use or disclosure. If you are not the intended recipient, you are strictly prohibited from reviewing, disclosing, copying using or disseminating any of this information or taking any action in reliance on or regarding this information. If you have received this fax in error, please notify us immediately by telephone so that we can arrange for its return to [place]. Phone: [phone], Toll-Free: [phone], Fax: [phone] Page: 2 of 2 Call Id: [id number] Paging DoctorName DoctorPhone DateTime Result/Outcome Notes Alysia Penna [phone] 05/28/2014 9:36:43 AM Called On Call Provider - Left Message Alysia Penna 05/28/2014 9:56:45 AM Spoke with On Call - General [phone]- Dr. Sarajane Jews returned call. Information given. States he does not know where the information about the nose came from, but she is to use the ointment BID on her foot.

## 2014-06-16 ENCOUNTER — Ambulatory Visit (INDEPENDENT_AMBULATORY_CARE_PROVIDER_SITE_OTHER): Payer: PPO | Admitting: Internal Medicine

## 2014-06-16 ENCOUNTER — Encounter: Payer: Self-pay | Admitting: Internal Medicine

## 2014-06-16 ENCOUNTER — Other Ambulatory Visit (INDEPENDENT_AMBULATORY_CARE_PROVIDER_SITE_OTHER): Payer: PPO

## 2014-06-16 VITALS — BP 150/72 | HR 80 | Temp 98.3°F | Ht 67.0 in | Wt 135.8 lb

## 2014-06-16 DIAGNOSIS — E785 Hyperlipidemia, unspecified: Secondary | ICD-10-CM

## 2014-06-16 DIAGNOSIS — E119 Type 2 diabetes mellitus without complications: Secondary | ICD-10-CM

## 2014-06-16 DIAGNOSIS — E559 Vitamin D deficiency, unspecified: Secondary | ICD-10-CM

## 2014-06-16 DIAGNOSIS — Z86010 Personal history of colonic polyps: Secondary | ICD-10-CM

## 2014-06-16 DIAGNOSIS — R030 Elevated blood-pressure reading, without diagnosis of hypertension: Secondary | ICD-10-CM

## 2014-06-16 LAB — LIPID PANEL
Cholesterol: 140 mg/dL (ref 0–200)
HDL: 54.8 mg/dL (ref >39.00)
LDL Cholesterol: 78 mg/dL (ref 0–99)
NonHDL: 85.2
TRIGLYCERIDES: 35 mg/dL (ref 0.0–149.0)
Total CHOL/HDL Ratio: 3
VLDL: 7 mg/dL (ref 0.0–40.0)

## 2014-06-16 LAB — CBC WITH DIFFERENTIAL/PLATELET
Basophils Absolute: 0 10*3/uL (ref 0.0–0.1)
Basophils Relative: 0.3 % (ref 0.0–3.0)
EOS ABS: 0.1 10*3/uL (ref 0.0–0.7)
Eosinophils Relative: 1.1 % (ref 0.0–5.0)
HEMATOCRIT: 41.2 % (ref 36.0–46.0)
Hemoglobin: 14.1 g/dL (ref 12.0–15.0)
Lymphocytes Relative: 27.3 % (ref 12.0–46.0)
Lymphs Abs: 2.2 10*3/uL (ref 0.7–4.0)
MCHC: 34.3 g/dL (ref 30.0–36.0)
MCV: 86.3 fl (ref 78.0–100.0)
Monocytes Absolute: 0.5 10*3/uL (ref 0.1–1.0)
Monocytes Relative: 6.4 % (ref 3.0–12.0)
NEUTROS ABS: 5.3 10*3/uL (ref 1.4–7.7)
Neutrophils Relative %: 64.9 % (ref 43.0–77.0)
Platelets: 174 10*3/uL (ref 150.0–400.0)
RBC: 4.77 Mil/uL (ref 3.87–5.11)
RDW: 13.4 % (ref 11.5–15.5)
WBC: 8.1 10*3/uL (ref 4.0–10.5)

## 2014-06-16 LAB — MICROALBUMIN / CREATININE URINE RATIO
Creatinine,U: 71.9 mg/dL
MICROALB/CREAT RATIO: 1 mg/g (ref 0.0–30.0)
Microalb, Ur: 0.7 mg/dL (ref 0.0–1.9)

## 2014-06-16 LAB — BASIC METABOLIC PANEL
BUN: 14 mg/dL (ref 6–23)
CO2: 24 mEq/L (ref 19–32)
Calcium: 9.1 mg/dL (ref 8.4–10.5)
Chloride: 100 mEq/L (ref 96–112)
Creatinine, Ser: 0.8 mg/dL (ref 0.4–1.2)
GFR: 74.08 mL/min (ref >60.00)
Glucose, Bld: 119 mg/dL — ABNORMAL HIGH (ref 70–99)
Potassium: 4.4 mEq/L (ref 3.5–5.1)
Sodium: 133 mEq/L — ABNORMAL LOW (ref 135–145)

## 2014-06-16 LAB — HEPATIC FUNCTION PANEL
ALBUMIN: 4.3 g/dL (ref 3.5–5.2)
ALT: 18 U/L (ref 0–35)
AST: 23 U/L (ref 0–37)
Alkaline Phosphatase: 62 U/L (ref 39–117)
Bilirubin, Direct: 0.1 mg/dL (ref 0.0–0.3)
Total Bilirubin: 0.7 mg/dL (ref 0.2–1.2)
Total Protein: 7.5 g/dL (ref 6.0–8.3)

## 2014-06-16 LAB — TSH: TSH: 0.93 u[IU]/mL (ref 0.35–4.50)

## 2014-06-16 LAB — HEMOGLOBIN A1C: Hgb A1c MFr Bld: 5.9 % (ref 4.6–6.5)

## 2014-06-16 LAB — VITAMIN D 25 HYDROXY (VIT D DEFICIENCY, FRACTURES): VITD: 56.62 ng/mL (ref 30.00–100.00)

## 2014-06-16 NOTE — Assessment & Plan Note (Signed)
Lipids, LFTs, TSH  

## 2014-06-16 NOTE — Assessment & Plan Note (Signed)
CBC

## 2014-06-16 NOTE — Progress Notes (Signed)
Subjective:    Patient ID: Lisa Mcgee, female    DOB: Mar 13, 1946, 69 y.o.   MRN: [id number]  HPI She is here to assess status of active health conditions.  PMH, FH, & Social History reviewed & updated.  She is not on an exercise program per se but she is very physically active without cardiopulmonary symptoms.  In 2006 her A1c was over 11%. There is diabetes in both her mother and sister. With diet and exercise her A1c's have been less than 6 %. FBS range:90-134 2 hour post meal glucose not checked Hypoglycemia not reported Ophthalmologic exam is current ;no retinopathy present. Foot care current Diet is heart healthy , low carb  She had tubular adenoma in 2007. Colonoscopy was negative in 2014. She has no active GI symptoms. She would be due for follow-up in 2019.    Review of Systems   Chest pain, palpitations, tachycardia, exertional dyspnea, paroxysmal nocturnal dyspnea, claudication or edema are absent.  Unexplained weight loss, abdominal pain, significant dyspepsia, dysphagia, melena, rectal bleeding, or persistently small caliber stools are denied.   Polyuria, polyphagia, polydipsia absent. There is no blurred vision, double vision, or loss of vision.  Also denied are numbness, tingling, or burning of the extremities. No nonhealing skin lesions present. Weight is stable.      Objective:   Physical Exam Gen.: Healthy and well-nourished in appearance. Alert, appropriate and cooperative throughout exam. Appears younger than stated age  Head: Normocephalic without obvious abnormalities  Eyes: No corneal or conjunctival inflammation noted. Pupils equal round reactive to light and accommodation. Extraocular motion intact.  Ears: External  ear exam reveals no significant lesions or deformities. Canals clear .TMs normal. Hearing is grossly normal bilaterally. Nose: External nasal exam reveals no deformity or inflammation. Nasal mucosa are pink and moist. No lesions or  exudates noted.   Mouth: Oral mucosa and oropharynx reveal no lesions or exudates. Teeth in good repair. Neck: No deformities, masses, or tenderness noted. Range of motion normal. Thyroid normally asymmetric; R lobe > R. Lungs: Normal respiratory effort; chest expands symmetrically. Lungs are clear to auscultation without rales, wheezes, or increased work of breathing. Heart: Normal rate and rhythm. Normal S1 and S2. No gallop, click, or rub. No murmur. Repeat BP 130/60 Abdomen: Bowel sounds normal; abdomen soft and nontender. No masses, organomegaly or hernias noted. Genitalia: as per Gyn                                  Musculoskeletal/extremities: No deformity or scoliosis noted of  the thoracic or lumbar spine.  No clubbing, cyanosis, edema, or significant extremity  deformity noted.  Range of motion normal . Tone & strength normal. Hand joints normal  Fingernail  health good. Able to lie down & sit up w/o help.  Negative SLR bilaterally Vascular: Carotid, radial artery, dorsalis pedis and  posterior tibial pulses are full and equal. No bruits present. Neurologic: Alert and oriented x3. Deep tendon reflexes symmetrical and normal.  Gait normal      Skin: Intact without suspicious lesions or rashes. Lymph: No cervical, axillary lymphadenopathy present. Psych: Mood and affect are normal. Normally interactive  Assessment & Plan:  See Current Assessment & Plan in Problem List under specific Diagnosis

## 2014-06-16 NOTE — Assessment & Plan Note (Signed)
A1c , urine microalbumin, BMET 

## 2014-06-16 NOTE — Progress Notes (Signed)
Pre visit review using our clinic review tool, if applicable. No additional management support is needed unless otherwise documented below in the visit note. 

## 2014-06-16 NOTE — Patient Instructions (Signed)
Your next office appointment will be determined based upon review of your pending labs. Those instructions will be transmitted to you through My Chart . 

## 2014-06-16 NOTE — Assessment & Plan Note (Signed)
Vitamin D level 

## 2014-06-21 ENCOUNTER — Encounter: Payer: Self-pay | Admitting: Internal Medicine

## 2014-07-05 ENCOUNTER — Telehealth: Payer: Self-pay | Admitting: *Deleted

## 2014-07-05 MED ORDER — FREESTYLE LANCETS MISC: 1.0000 | Freq: Two times a day (BID) | Status: DC

## 2014-07-05 MED ORDER — GLUCOSE BLOOD VI STRP: 1.0000 | ORAL_STRIP | Freq: Two times a day (BID) | Status: DC

## 2014-07-05 NOTE — Telephone Encounter (Signed)
Left msg on triage stating her insurance will no longer cover her BS monitor. Will cover Freestyle, Precision or one Touch wanting to see if we had meters. Called pt back inform her we had Free style will leave for pick-up sebt rx for strips...Johny Chess

## 2014-07-06 ENCOUNTER — Other Ambulatory Visit: Payer: Self-pay | Admitting: Internal Medicine

## 2014-07-06 ENCOUNTER — Encounter: Payer: Self-pay | Admitting: Internal Medicine

## 2014-07-06 DIAGNOSIS — L97529 Non-pressure chronic ulcer of other part of left foot with unspecified severity: Secondary | ICD-10-CM

## 2014-07-19 ENCOUNTER — Encounter (HOSPITAL_BASED_OUTPATIENT_CLINIC_OR_DEPARTMENT_OTHER): Payer: PPO | Attending: General Surgery

## 2014-07-19 ENCOUNTER — Ambulatory Visit (HOSPITAL_COMMUNITY)
Admission: RE | Admit: 2014-07-19 | Discharge: 2014-07-19 | Disposition: A | Discharge status: Home / Self Care | Payer: PPO | Source: Ambulatory Visit | Attending: General Surgery | Admitting: General Surgery

## 2014-07-19 ENCOUNTER — Other Ambulatory Visit (HOSPITAL_BASED_OUTPATIENT_CLINIC_OR_DEPARTMENT_OTHER): Payer: Self-pay | Admitting: General Surgery

## 2014-07-19 DIAGNOSIS — M19072 Primary osteoarthritis, left ankle and foot: Secondary | ICD-10-CM | POA: Insufficient documentation

## 2014-07-19 DIAGNOSIS — L97521 Non-pressure chronic ulcer of other part of left foot limited to breakdown of skin: Secondary | ICD-10-CM | POA: Diagnosis not present

## 2014-07-19 DIAGNOSIS — E11621 Type 2 diabetes mellitus with foot ulcer: Secondary | ICD-10-CM | POA: Insufficient documentation

## 2014-07-19 DIAGNOSIS — L97529 Non-pressure chronic ulcer of other part of left foot with unspecified severity: Secondary | ICD-10-CM | POA: Insufficient documentation

## 2014-07-19 DIAGNOSIS — M7732 Calcaneal spur, left foot: Secondary | ICD-10-CM | POA: Diagnosis not present

## 2014-07-19 DIAGNOSIS — M869 Osteomyelitis, unspecified: Secondary | ICD-10-CM

## 2014-07-19 DIAGNOSIS — M85872 Other specified disorders of bone density and structure, left ankle and foot: Secondary | ICD-10-CM | POA: Insufficient documentation

## 2014-07-20 NOTE — H&P (Signed)
Lisa Mcgee, OAT[place]                ACCOUNT NO.:  [id number]  MEDICAL RECORD NO.:  [id number]  LOCATION:                                 FACILITY:  PHYSICIAN:  Elesa Hacker, M.D.        DATE OF BIRTH:  1945/07/25  DATE OF ADMISSION:  07/19/2014 DATE OF DISCHARGE:  07/19/2014                             HISTORY & PHYSICAL   CHIEF COMPLAINT:  Wound, left foot.  HISTORY OF PRESENT ILLNESS:  This is a 69 year old female, developed a sizable mass in the arch of her left plantar foot.  This has been drained several times.  Clear fluid has been expressed.  She was told that she had a ganglion.  Biopsy was done and revealed fibrous tissue only.  The area is somewhat tender but not particularly painful.  She is a diabetic, approximately 10 years' duration, not insulin dependent.  PAST MEDICAL HISTORY:  Diabetes type 2.  PAST SURGICAL HISTORY:  Tonsillectomy.  Cigarettes none.  Alcohol none.  ALLERGY:  ATROPINE, BELLADONNA, and EGGS.  MEDICATIONS:  Multivitamins, Temovate, vitamin B12, Restasis, Flonase, and Desyrel.  REVIEW OF SYSTEMS:  Essentially negative.  PHYSICAL EXAMINATION:  VITAL SIGNS:  Temperature 97.7, pulse 74 and regular, respirations 18, blood pressure 141/56.  Glucose is 105. GENERAL APPEARANCE:  Well developed, well nourished, no distress. CHEST:  Clear. HEART:  Regular rhythm. EXTREMITIES:  Reveals ABI of 0.85 on the left.  I cannot feel pulses. In the plantar aspect of the foot in the arch, there is a 0.3 x 0.3 x 0.1 wound which probably represents a biopsy site.  There is a tiny pinhole which can be probed and there is a cavity underneath the skin. This area is extremely tender and red and has a spongy feel.  IMPRESSION:  Diabetic foot ulcer, Wagner 2, probable subcutaneous mass.  PLAN OF TREATMENT:  We will get x-ray of the foot to rule out foreign body, osteo, we will get arterial studies and referral to orthopedist. She may need to have this area explored or  the wound or the mass excised.     Elesa Hacker, M.D.     RA/MEDQ  D:  07/19/2014  T:  07/20/2014  Job:  [id number]

## 2014-07-26 DIAGNOSIS — L97521 Non-pressure chronic ulcer of other part of left foot limited to breakdown of skin: Secondary | ICD-10-CM | POA: Diagnosis not present

## 2014-07-26 DIAGNOSIS — E11621 Type 2 diabetes mellitus with foot ulcer: Secondary | ICD-10-CM | POA: Diagnosis not present

## 2014-08-03 ENCOUNTER — Telehealth: Payer: Self-pay | Admitting: Internal Medicine

## 2014-08-03 NOTE — Telephone Encounter (Signed)
Patient want to make sure Dr. Linna Darner did not do labs in regards to codes 401.0 and 601.812

## 2014-08-03 NOTE — Telephone Encounter (Signed)
Those codes can't be used; they are obsolete

## 2014-08-03 NOTE — Telephone Encounter (Signed)
Phone call to patient. She's been advised. Would like to pick up labs at the front desk. Labs have been left.

## 2014-08-09 ENCOUNTER — Encounter (HOSPITAL_BASED_OUTPATIENT_CLINIC_OR_DEPARTMENT_OTHER): Payer: PPO

## 2014-09-13 ENCOUNTER — Other Ambulatory Visit: Payer: Self-pay | Admitting: Orthopedic Surgery

## 2015-01-28 IMAGING — MG MM DIAGNOSTIC UNILATERAL L
2 series · 2 of 2 positions shown · non-contrast
Comparison: 03/22/2013 and earlier

CLINICAL DATA: The patient returns for evaluation of left breast
calcifications.

EXAM:
DIGITAL DIAGNOSTIC  left MAMMOGRAM

[L CC]
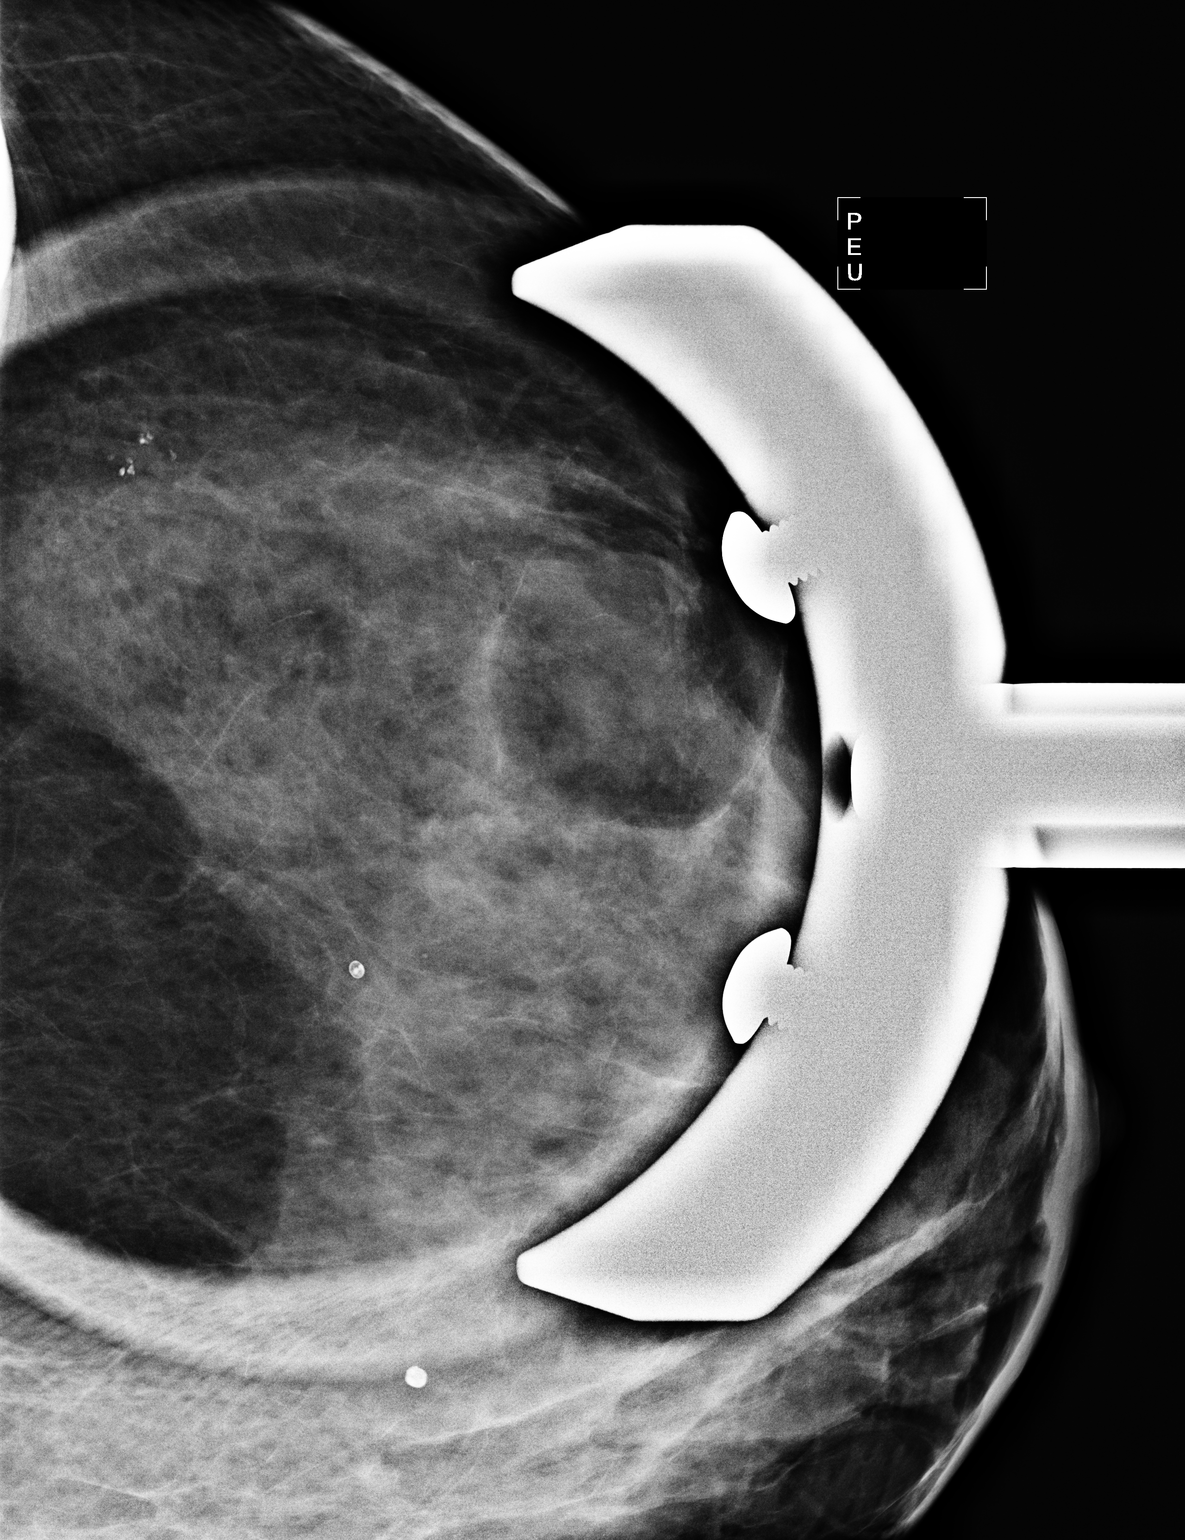

[L ML]
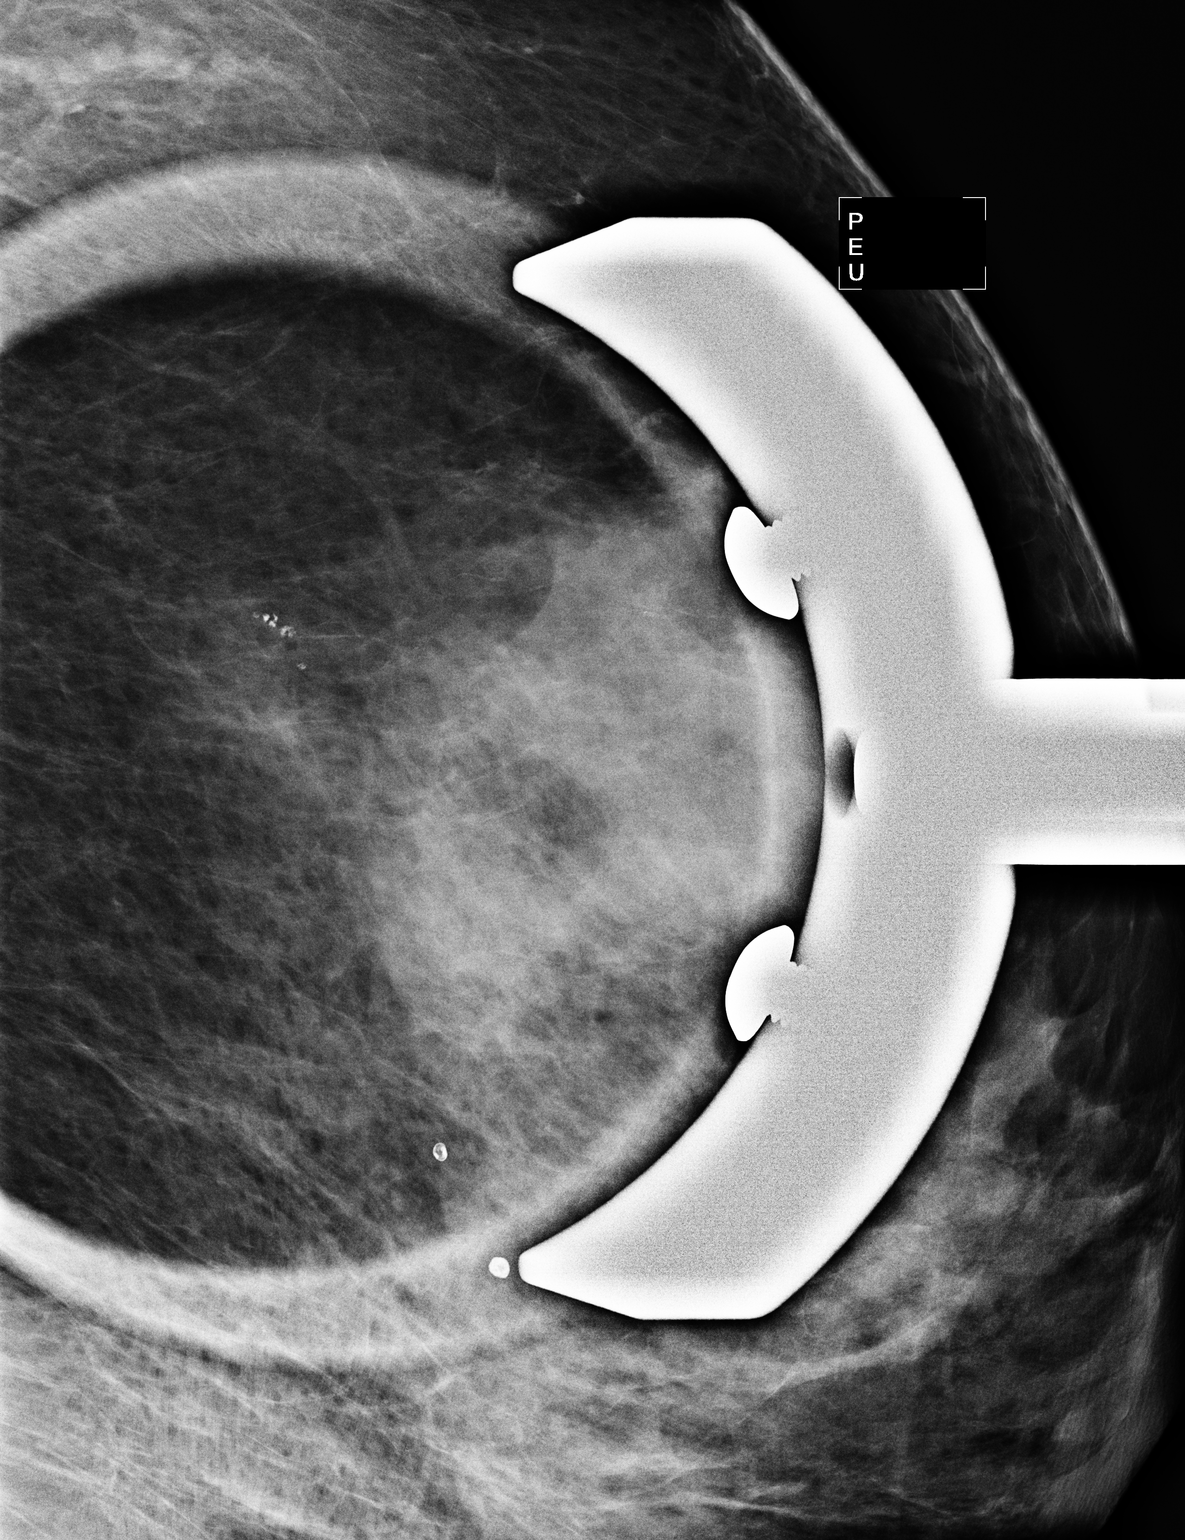

[2 of 2 positions shown; findings below may reference images not displayed]

ACR Breast Density Category c: The breasts are heterogeneously
dense, which may obscure small masses.
FINDINGS: Magnified views are performed of calcifications in the upper-outer
quadrant of the left breast. On these magnified views,
calcifications appear dystrophic, consistent with benign process. No
suspicious morphology or distribution of calcifications identified.
IMPRESSION: Benign-appearing calcifications in the left breast. Followup is
recommended.

RECOMMENDATION:
Left diagnostic mammogram is suggested in 6 months.

I have discussed the findings and recommendations with the patient.
Results were also provided in writing at the conclusion of the
visit. If applicable, a reminder letter will be sent to the patient
regarding the next appointment.

BI-RADS CATEGORY  3: Probably benign finding(s) - short interval
follow-up suggested.

## 2015-02-08 ENCOUNTER — Other Ambulatory Visit: Payer: Self-pay | Admitting: Internal Medicine

## 2015-02-08 ENCOUNTER — Encounter: Payer: Self-pay | Admitting: Internal Medicine

## 2015-02-08 DIAGNOSIS — Z1159 Encounter for screening for other viral diseases: Secondary | ICD-10-CM

## 2015-02-08 NOTE — Telephone Encounter (Signed)
Please advise, I think she is talking about the screening not the shot.

## 2015-02-14 ENCOUNTER — Other Ambulatory Visit: Payer: PPO

## 2015-02-14 ENCOUNTER — Ambulatory Visit (INDEPENDENT_AMBULATORY_CARE_PROVIDER_SITE_OTHER): Payer: PPO

## 2015-02-14 DIAGNOSIS — Z23 Encounter for immunization: Secondary | ICD-10-CM | POA: Diagnosis not present

## 2015-02-14 DIAGNOSIS — Z1159 Encounter for screening for other viral diseases: Secondary | ICD-10-CM

## 2015-02-14 LAB — HEPATITIS C ANTIBODY: HCV AB: NEGATIVE

## 2015-02-28 ENCOUNTER — Ambulatory Visit (INDEPENDENT_AMBULATORY_CARE_PROVIDER_SITE_OTHER): Payer: PPO

## 2015-02-28 DIAGNOSIS — Z23 Encounter for immunization: Secondary | ICD-10-CM

## 2015-03-06 ENCOUNTER — Encounter: Payer: Self-pay | Admitting: Internal Medicine

## 2015-03-09 ENCOUNTER — Telehealth: Payer: Self-pay | Admitting: Internal Medicine

## 2015-03-09 NOTE — Telephone Encounter (Signed)
Pt called in and wanted to know if there is anyway she can get her strips that she checks her sugar with free somewhere.  She said that she is paying a lot for them and now she is only getting 50 at a time.  She use to get 100    Best number is [phone]

## 2015-03-13 ENCOUNTER — Other Ambulatory Visit: Payer: Self-pay | Admitting: Obstetrics and Gynecology

## 2015-03-13 ENCOUNTER — Other Ambulatory Visit: Payer: Self-pay | Admitting: Emergency Medicine

## 2015-03-13 DIAGNOSIS — R921 Mammographic calcification found on diagnostic imaging of breast: Secondary | ICD-10-CM

## 2015-03-13 MED ORDER — GLUCOSE BLOOD VI STRP: 1.0000 | ORAL_STRIP | Freq: Two times a day (BID) | Status: DC

## 2015-03-13 NOTE — Telephone Encounter (Signed)
This pt called back in about getting the 100 instead of the 50 strips    Pharmacy is [facility]

## 2015-03-13 NOTE — Telephone Encounter (Signed)
Spoke with pt's husband to inform.  

## 2015-03-20 LAB — HM DIABETES EYE EXAM

## 2015-03-21 ENCOUNTER — Ambulatory Visit: Payer: PPO

## 2015-04-18 ENCOUNTER — Encounter: Payer: Self-pay | Admitting: Internal Medicine

## 2015-04-19 ENCOUNTER — Encounter: Payer: Self-pay | Admitting: Internal Medicine

## 2015-04-24 ENCOUNTER — Ambulatory Visit
Admission: RE | Admit: 2015-04-24 | Discharge: 2015-04-24 | Disposition: A | Discharge status: Home / Self Care | Payer: PPO | Source: Ambulatory Visit | Attending: Obstetrics and Gynecology | Admitting: Obstetrics and Gynecology

## 2015-04-24 DIAGNOSIS — R921 Mammographic calcification found on diagnostic imaging of breast: Secondary | ICD-10-CM

## 2015-04-24 LAB — HM MAMMOGRAPHY

## 2015-08-02 ENCOUNTER — Encounter: Payer: Self-pay | Admitting: Internal Medicine

## 2015-08-02 ENCOUNTER — Telehealth: Payer: Self-pay | Admitting: Internal Medicine

## 2015-08-02 NOTE — Telephone Encounter (Signed)
Pt wanted me to send this to update in her chart   Eye exam- Dr Nira Conn Omen 03/20/2015 [place] -04/24/2015 PAP and Dexa scan   05/16/2015

## 2015-08-03 ENCOUNTER — Encounter: Payer: Self-pay | Admitting: Internal Medicine

## 2015-08-03 NOTE — Telephone Encounter (Signed)
LVM for pt to call back to give more information.

## 2015-09-19 ENCOUNTER — Encounter: Payer: Self-pay | Admitting: Internal Medicine

## 2015-11-07 ENCOUNTER — Ambulatory Visit (INDEPENDENT_AMBULATORY_CARE_PROVIDER_SITE_OTHER): Payer: PPO | Admitting: Internal Medicine

## 2015-11-07 ENCOUNTER — Ambulatory Visit: Payer: PPO | Admitting: Internal Medicine

## 2015-11-07 ENCOUNTER — Encounter: Payer: Self-pay | Admitting: Internal Medicine

## 2015-11-07 ENCOUNTER — Other Ambulatory Visit (INDEPENDENT_AMBULATORY_CARE_PROVIDER_SITE_OTHER): Payer: PPO

## 2015-11-07 VITALS — BP 146/68 | HR 78 | Temp 98.0°F | Resp 16 | Ht 67.0 in | Wt 149.0 lb

## 2015-11-07 DIAGNOSIS — Z0000 Encounter for general adult medical examination without abnormal findings: Secondary | ICD-10-CM | POA: Diagnosis not present

## 2015-11-07 DIAGNOSIS — R030 Elevated blood-pressure reading, without diagnosis of hypertension: Secondary | ICD-10-CM | POA: Diagnosis not present

## 2015-11-07 DIAGNOSIS — E119 Type 2 diabetes mellitus without complications: Secondary | ICD-10-CM | POA: Diagnosis not present

## 2015-11-07 LAB — HEMOGLOBIN A1C: HEMOGLOBIN A1C: 6 % (ref 4.6–6.5)

## 2015-11-07 LAB — CBC WITH DIFFERENTIAL/PLATELET
BASOS PCT: 0.3 % (ref 0.0–3.0)
Basophils Absolute: 0 10*3/uL (ref 0.0–0.1)
EOS PCT: 1.2 % (ref 0.0–5.0)
Eosinophils Absolute: 0.1 10*3/uL (ref 0.0–0.7)
HEMATOCRIT: 40.1 % (ref 36.0–46.0)
HEMOGLOBIN: 13.7 g/dL (ref 12.0–15.0)
LYMPHS PCT: 31.7 % (ref 12.0–46.0)
Lymphs Abs: 2.4 10*3/uL (ref 0.7–4.0)
MCHC: 34.2 g/dL (ref 30.0–36.0)
MCV: 85.3 fl (ref 78.0–100.0)
MONOS PCT: 7.3 % (ref 3.0–12.0)
Monocytes Absolute: 0.6 10*3/uL (ref 0.1–1.0)
Neutro Abs: 4.5 10*3/uL (ref 1.4–7.7)
Neutrophils Relative %: 59.5 % (ref 43.0–77.0)
Platelets: 190 10*3/uL (ref 150.0–400.0)
RBC: 4.7 Mil/uL (ref 3.87–5.11)
RDW: 12.9 % (ref 11.5–15.5)
WBC: 7.5 10*3/uL (ref 4.0–10.5)

## 2015-11-07 LAB — COMPREHENSIVE METABOLIC PANEL
ALBUMIN: 4.4 g/dL (ref 3.5–5.2)
ALK PHOS: 67 U/L (ref 39–117)
ALT: 15 U/L (ref 0–35)
AST: 21 U/L (ref 0–37)
BUN: 13 mg/dL (ref 6–23)
CALCIUM: 9.6 mg/dL (ref 8.4–10.5)
CHLORIDE: 104 meq/L (ref 96–112)
CO2: 28 mEq/L (ref 19–32)
CREATININE: 0.83 mg/dL (ref 0.40–1.20)
GFR: 72.74 mL/min (ref >60.00)
Glucose, Bld: 123 mg/dL — ABNORMAL HIGH (ref 70–99)
POTASSIUM: 4.3 meq/L (ref 3.5–5.1)
Sodium: 138 mEq/L (ref 135–145)
Total Bilirubin: 0.4 mg/dL (ref 0.2–1.2)
Total Protein: 7.1 g/dL (ref 6.0–8.3)

## 2015-11-07 LAB — LIPID PANEL
CHOLESTEROL: 151 mg/dL (ref 0–200)
HDL: 59.6 mg/dL (ref >39.00)
LDL Cholesterol: 80 mg/dL (ref 0–99)
NonHDL: 91.31
Total CHOL/HDL Ratio: 3
Triglycerides: 55 mg/dL (ref 0.0–149.0)
VLDL: 11 mg/dL (ref 0.0–40.0)

## 2015-11-07 LAB — MICROALBUMIN / CREATININE URINE RATIO
CREATININE, U: 145 mg/dL
MICROALB/CREAT RATIO: 0.5 mg/g (ref 0.0–30.0)
Microalb, Ur: <0.7 mg/dL (ref 0.0–1.9)

## 2015-11-07 LAB — TSH: TSH: 0.62 u[IU]/mL (ref 0.35–4.50)

## 2015-11-07 MED ORDER — GLUCOSE BLOOD VI STRP: 1.0000 | ORAL_STRIP | Freq: Two times a day (BID) | Status: DC

## 2015-11-07 NOTE — Progress Notes (Signed)
Pre visit review using our clinic review tool, if applicable. No additional management support is needed unless otherwise documented below in the visit note. 

## 2015-11-07 NOTE — Progress Notes (Signed)
Subjective:    Patient ID: Lisa Mcgee, female    DOB: 11/08/1945, 70 y.o.   MRN: [id number]  HPI She is here to establish with a new pcp.  She is here for a physical exam.   She denies any changes her her health.  She has started to exercise again - she was not exercising regularly.  She plans on increasing her exercise.  She has no concerns.   She checks her sugars daily.  They have been well controlled.  She eats a very healthy diet.    Medications and allergies reviewed with patient and updated if appropriate.  Patient Active Problem List   Diagnosis Date Noted  . Ulcer of left foot ([place]) 07/06/2014  . Pes cavus 04/11/2014  . Vitamin D deficiency 08/04/2013  . Ganglion cyst of left foot 06/22/2013  . Osteopenia 04/17/2013  . Rhinitis, non-allergic 05/26/2011  . SLEEP DISORDER 08/27/2010  . SYNCOPE 06/28/2008  . PALPITATIONS 06/28/2008  . Hyperlipidemia 04/20/2008  . History of colonic polyps 04/20/2008  . Diabetes type 2, controlled ([place]) 05/28/2007    Current Outpatient Prescriptions on File Prior to Visit  Medication Sig Dispense Refill  . Calcium Carbonate-Vitamin D (CALCIUM 600 + D PO) Take 1 tablet by mouth 2 (two) times daily.     . cholecalciferol (VITAMIN D) 1000 UNITS tablet Take 1,000 Units by mouth daily.    . Cholecalciferol (VITAMIN D3) 2000 UNITS TABS Take 1 tablet by mouth 2 (two) times daily.     . clobetasol cream (TEMOVATE) [id number] % Apply 1 application topically as needed.    . Coenzyme Q10 (COQ10 PO) Take 300 mg by mouth daily.      . cycloSPORINE (RESTASIS) 0.05 % ophthalmic emulsion Place 1 drop into both eyes 2 (two) times daily.    . fish oil-omega-3 fatty acids 1000 MG capsule Take 2 g by mouth daily.      . Flaxseed, Linseed, (FLAX SEED OIL) 1000 MG CAPS Take 1 capsule by mouth 2 (two) times daily.     . fluticasone (FLONASE) 50 MCG/ACT nasal spray PLACE 2 SPRAYS IN EACH NOSTRIL DAILY    AS NEEDED 16 g 0  . Garlic 123XX123 MG TABS Take 1,000 mg by  mouth 2 (two) times daily.     Marland Kitchen glucose blood test strip 1 each by Other route 2 (two) times daily. Use to check blood sugars twice a day Dx E11.9 100 each 3  . ibuprofen (ADVIL,MOTRIN) 200 MG tablet Take 200 mg by mouth. Take 3 pills prn    . ketoconazole (NIZORAL) 2 % cream Apply 1 application topically 2 (two) times daily as needed for irritation. 15 g 0  . Lancet Device MISC Test blood sugar before each meal and at bedtime. 1 each 11  . Lancets (FREESTYLE) lancets 1 each by Other route 2 (two) times daily. Use to check blood sugars twice a day Dx E11.9 60 each 11  . Multiple Vitamins-Minerals (HAIR/SKIN/NAILS PO) Take 1 tablet by mouth daily.     . mupirocin ointment (BACTROBAN) 2 % Apply 1 application topically as needed.    . NON FORMULARY [employer] MVI-Take 1 pill bid    . pyridOXINE (VITAMIN B-6) 100 MG tablet Take 100 mg by mouth 2 (two) times daily.     Marland Kitchen triamcinolone cream (KENALOG) 0.1 % Apply 1 application topically as needed.    . vitamin B-12 (CYANOCOBALAMIN) 1000 MCG tablet Take 1,000 mcg by mouth daily.  No current facility-administered medications on file prior to visit.    Past Medical History  Diagnosis Date  . Environmental allergies   . Hyperlipidemia     low HDL  . Diabetes mellitus without complication     diet controlled    Past Surgical History  Procedure Laterality Date  . Tonsillectomy and adenoidectomy  age 29  . Colonoscopy  2007    Adenomatous polyp  . Colonoscopy  2014    neg    Social History   Social History  . Marital Status: Married    Spouse Name: N/A  . Number of Children: N/A  . Years of Education: N/A   Social History Main Topics  . Smoking status: Never Smoker   . Smokeless tobacco: Never Used  . Alcohol Use: No  . Drug Use: No  . Sexual Activity: Not on file   Other Topics Concern  . Not on file   Social History Narrative    Family History  Problem Relation Age of Onset  . Allergies Mother   . Diabetes  Mother   . Heart attack Father     > 9  . Stroke Neg Hx   . Cancer Neg Hx   . Diabetes Sister     Review of Systems  Constitutional: Negative for fever, chills, appetite change and fatigue.  HENT: Negative for hearing loss.   Eyes: Negative for visual disturbance.  Respiratory: Negative for cough, shortness of breath and wheezing.   Cardiovascular: Positive for palpitations (occasional). Negative for chest pain and leg swelling.  Gastrointestinal: Negative for nausea, abdominal pain, diarrhea, constipation and blood in stool.       No gerd  Genitourinary: Negative for dysuria and hematuria.  Musculoskeletal: Negative for myalgias, back pain and arthralgias.  Skin: Negative for color change and rash.  Neurological: Positive for headaches (rare). Negative for dizziness and light-headedness.  Psychiatric/Behavioral: Negative for dysphoric mood. The patient is not nervous/anxious.        Objective:   Filed Vitals:   11/07/15 0829  BP: 146/68  Pulse: 78  Temp: 98 F (36.7 C)  Resp: 16   Filed Weights   11/07/15 0829  Weight: 149 lb (67.586 kg)   Body mass index is 23.33 kg/(m^2).   Physical Exam Constitutional: She appears well-developed and well-nourished. No distress.  HENT:  Head: Normocephalic and atraumatic.  Right Ear: External ear normal. Normal ear canal and TM Left Ear: External ear normal.  Normal ear canal and TM Mouth/Throat: Oropharynx is clear and moist.  Eyes: Conjunctivae and EOM are normal.  Neck: Neck supple. No tracheal deviation present. No thyromegaly present.  No carotid bruit  Cardiovascular: Normal rate, regular rhythm and normal heart sounds.   No murmur heard.  No edema. Pulmonary/Chest: Effort normal and breath sounds normal. No respiratory distress. She has no wheezes. She has no rales.  Breast: deferred to Gyn Abdominal: Soft. She exhibits no distension. There is no tenderness.  Lymphadenopathy: She has no cervical adenopathy.  Skin:  Skin is warm and dry. She is not diaphoretic.  no foot lesions - scar from wear cyst was removed on plantar surface of left foot.  Psychiatric: She has a normal mood and affect. Her behavior is normal.         Assessment & Plan:   Physical exam: Screening blood work   ordered Immunizations  Up to date  Colonoscopy  Up to date  Mammogram  Up to date  Gyn  Up to date  Dexa  Up to date  Eye exams  Up to date  EKG -  today Exercise - regularly -- exercise increases Weight  Normal BMI Skin  -  No concerns Substance abuse -  No concerns for abuse  Elevated blood pressure Her blood pressure is typically lower and well controlled.  She feels it is slightly elevated here today because she is at the doctors.   EKG today Will monitor No medication needed at this time  See Problem List for Assessment and Plan of chronic medical problems.  F/u annually

## 2015-11-07 NOTE — Patient Instructions (Addendum)
Test(s) ordered today. Your results will be released to [facility] (or called to you) after review, usually within 72hours after test completion. If any changes need to be made, you will be notified at that same time.  All other Health Maintenance issues reviewed.   All recommended immunizations and age-appropriate screenings are up-to-date or discussed.  No immunizations administered today.   An EKG was done today.   Medications reviewed and updated.  No changes recommended at this time.  Your prescription(s) have been submitted to your pharmacy. Please take as directed and contact our office if you believe you are having problem(s) with the medication(s).   Please followup in one year  Health Maintenance, Female Adopting a healthy lifestyle and getting preventive care can go a long way to promote health and wellness. Talk with your health care provider about what schedule of regular examinations is right for you. This is a good chance for you to check in with your provider about disease prevention and staying healthy. In between checkups, there are plenty of things you can do on your own. Experts have done a lot of research about which lifestyle changes and preventive measures are most likely to keep you healthy. Ask your health care provider for more information. WEIGHT AND DIET  Eat a healthy diet  Be sure to include plenty of vegetables, fruits, low-fat dairy products, and lean protein.  Do not eat a lot of foods high in solid fats, added sugars, or salt.  Get regular exercise. This is one of the most important things you can do for your health.  Most adults should exercise for at least 150 minutes each week. The exercise should increase your heart rate and make you sweat (moderate-intensity exercise).  Most adults should also do strengthening exercises at least twice a week. This is in addition to the moderate-intensity exercise.  Maintain a healthy weight  Body mass index (BMI)  is a measurement that can be used to identify possible weight problems. It estimates body fat based on height and weight. Your health care provider can help determine your BMI and help you achieve or maintain a healthy weight.  For females 55 years of age and older:   A BMI below 18.5 is considered underweight.  A BMI of 18.5 to 24.9 is normal.  A BMI of 25 to 29.9 is considered overweight.  A BMI of 30 and above is considered obese.  Watch levels of cholesterol and blood lipids  You should start having your blood tested for lipids and cholesterol at 68 years of age, then have this test every 5 years.  You may need to have your cholesterol levels checked more often if:  Your lipid or cholesterol levels are high.  You are older than 68 years of age.  You are at high risk for heart disease.  CANCER SCREENING   Lung Cancer  Lung cancer screening is recommended for adults 32-26 years old who are at high risk for lung cancer because of a history of smoking.  A yearly low-dose CT scan of the lungs is recommended for people who:  Currently smoke.  Have quit within the past 15 years.  Have at least a 30-pack-year history of smoking. A pack year is smoking an average of one pack of cigarettes a day for 1 year.  Yearly screening should continue until it has been 15 years since you quit.  Yearly screening should stop if you develop a health problem that would prevent you from having  lung cancer treatment.  Breast Cancer  Practice breast self-awareness. This means understanding how your breasts normally appear and feel.  It also means doing regular breast self-exams. Let your health care provider know about any changes, no matter how small.  If you are in your 20s or 30s, you should have a clinical breast exam (CBE) by a health care provider every 1-3 years as part of a regular health exam.  If you are 1 or older, have a CBE every year. Also consider having a breast X-ray  (mammogram) every year.  If you have a family history of breast cancer, talk to your health care provider about genetic screening.  If you are at high risk for breast cancer, talk to your health care provider about having an MRI and a mammogram every year.  Breast cancer gene (BRCA) assessment is recommended for women who have family members with BRCA-related cancers. BRCA-related cancers include:  Breast.  Ovarian.  Tubal.  Peritoneal cancers.  Results of the assessment will determine the need for genetic counseling and BRCA1 and BRCA2 testing. Cervical Cancer Your health care provider may recommend that you be screened regularly for cancer of the pelvic organs (ovaries, uterus, and vagina). This screening involves a pelvic examination, including checking for microscopic changes to the surface of your cervix (Pap test). You may be encouraged to have this screening done every 3 years, beginning at age 4.  For women ages 40-65, health care providers may recommend pelvic exams and Pap testing every 3 years, or they may recommend the Pap and pelvic exam, combined with testing for human papilloma virus (HPV), every 5 years. Some types of HPV increase your risk of cervical cancer. Testing for HPV may also be done on women of any age with unclear Pap test results.  Other health care providers may not recommend any screening for nonpregnant women who are considered low risk for pelvic cancer and who do not have symptoms. Ask your health care provider if a screening pelvic exam is right for you.  If you have had past treatment for cervical cancer or a condition that could lead to cancer, you need Pap tests and screening for cancer for at least 20 years after your treatment. If Pap tests have been discontinued, your risk factors (such as having a new sexual partner) need to be reassessed to determine if screening should resume. Some women have medical problems that increase the chance of getting  cervical cancer. In these cases, your health care provider may recommend more frequent screening and Pap tests. Colorectal Cancer  This type of cancer can be detected and often prevented.  Routine colorectal cancer screening usually begins at 68 years of age and continues through 68 years of age.  Your health care provider may recommend screening at an earlier age if you have risk factors for colon cancer.  Your health care provider may also recommend using home test kits to check for hidden blood in the stool.  A small camera at the end of a tube can be used to examine your colon directly (sigmoidoscopy or colonoscopy). This is done to check for the earliest forms of colorectal cancer.  Routine screening usually begins at age 67.  Direct examination of the colon should be repeated every 5-10 years through 68 years of age. However, you may need to be screened more often if early forms of precancerous polyps or small growths are found. Skin Cancer  Check your skin from head to toe regularly.  Tell your health care provider about any new moles or changes in moles, especially if there is a change in a mole's shape or color.  Also tell your health care provider if you have a mole that is larger than the size of a pencil eraser.  Always use sunscreen. Apply sunscreen liberally and repeatedly throughout the day.  Protect yourself by wearing long sleeves, pants, a wide-brimmed hat, and sunglasses whenever you are outside. HEART DISEASE, DIABETES, AND HIGH BLOOD PRESSURE   High blood pressure causes heart disease and increases the risk of stroke. High blood pressure is more likely to develop in:  People who have blood pressure in the high end of the normal range (130-139/85-89 mm Hg).  People who are overweight or obese.  People who are African American.  If you are 49-61 years of age, have your blood pressure checked every 3-5 years. If you are 71 years of age or older, have your blood  pressure checked every year. You should have your blood pressure measured twice--once when you are at a hospital or clinic, and once when you are not at a hospital or clinic. Record the average of the two measurements. To check your blood pressure when you are not at a hospital or clinic, you can use:  An automated blood pressure machine at a pharmacy.  A home blood pressure monitor.  If you are between 55 years and 53 years old, ask your health care provider if you should take aspirin to prevent strokes.  Have regular diabetes screenings. This involves taking a blood sample to check your fasting blood sugar level.  If you are at a normal weight and have a low risk for diabetes, have this test once every three years after 68 years of age.  If you are overweight and have a high risk for diabetes, consider being tested at a younger age or more often. PREVENTING INFECTION  Hepatitis B  If you have a higher risk for hepatitis B, you should be screened for this virus. You are considered at high risk for hepatitis B if:  You were born in a country where hepatitis B is common. Ask your health care provider which countries are considered high risk.  Your parents were born in a high-risk country, and you have not been immunized against hepatitis B (hepatitis B vaccine).  You have HIV or AIDS.  You use needles to inject street drugs.  You live with someone who has hepatitis B.  You have had sex with someone who has hepatitis B.  You get hemodialysis treatment.  You take certain medicines for conditions, including cancer, organ transplantation, and autoimmune conditions. Hepatitis C  Blood testing is recommended for:  Everyone born from [birth year] through 1963.  Anyone with known risk factors for hepatitis C. Sexually transmitted infections (STIs)  You should be screened for sexually transmitted infections (STIs) including gonorrhea and chlamydia if:  You are sexually active and are  younger than 68 years of age.  You are older than 68 years of age and your health care provider tells you that you are at risk for this type of infection.  Your sexual activity has changed since you were last screened and you are at an increased risk for chlamydia or gonorrhea. Ask your health care provider if you are at risk.  If you do not have HIV, but are at risk, it may be recommended that you take a prescription medicine daily to prevent HIV infection. This is called pre-exposure  prophylaxis (PrEP). You are considered at risk if:  You are sexually active and do not regularly use condoms or know the HIV status of your partner(s).  You take drugs by injection.  You are sexually active with a partner who has HIV. Talk with your health care provider about whether you are at high risk of being infected with HIV. If you choose to begin PrEP, you should first be tested for HIV. You should then be tested every 3 months for as long as you are taking PrEP.  PREGNANCY   If you are premenopausal and you may become pregnant, ask your health care provider about preconception counseling.  If you may become pregnant, take 400 to 800 micrograms (mcg) of folic acid every day.  If you want to prevent pregnancy, talk to your health care provider about birth control (contraception). OSTEOPOROSIS AND MENOPAUSE   Osteoporosis is a disease in which the bones lose minerals and strength with aging. This can result in serious bone fractures. Your risk for osteoporosis can be identified using a bone density scan.  If you are 87 years of age or older, or if you are at risk for osteoporosis and fractures, ask your health care provider if you should be screened.  Ask your health care provider whether you should take a calcium or vitamin D supplement to lower your risk for osteoporosis.  Menopause may have certain physical symptoms and risks.  Hormone replacement therapy may reduce some of these symptoms and  risks. Talk to your health care provider about whether hormone replacement therapy is right for you.  HOME CARE INSTRUCTIONS   Schedule regular health, dental, and eye exams.  Stay current with your immunizations.   Do not use any tobacco products including cigarettes, chewing tobacco, or electronic cigarettes.  If you are pregnant, do not drink alcohol.  If you are breastfeeding, limit how much and how often you drink alcohol.  Limit alcohol intake to no more than 1 drink per day for nonpregnant women. One drink equals 12 ounces of beer, 5 ounces of wine, or 1 ounces of hard liquor.  Do not use street drugs.  Do not share needles.  Ask your health care provider for help if you need support or information about quitting drugs.  Tell your health care provider if you often feel depressed.  Tell your health care provider if you have ever been abused or do not feel safe at home.   This information is not intended to replace advice given to you by your health care provider. Make sure you discuss any questions you have with your health care provider.   Document Released: 12/10/2010 Document Revised: 06/17/2014 Document Reviewed: 04/28/2013 Elsevier Interactive Patient Education [employer].

## 2015-11-09 ENCOUNTER — Encounter: Payer: Self-pay | Admitting: Internal Medicine

## 2015-11-10 ENCOUNTER — Encounter: Payer: Self-pay | Admitting: Internal Medicine

## 2016-03-01 ENCOUNTER — Telehealth: Payer: Self-pay

## 2016-03-01 ENCOUNTER — Ambulatory Visit (INDEPENDENT_AMBULATORY_CARE_PROVIDER_SITE_OTHER): Payer: PPO

## 2016-03-01 DIAGNOSIS — Z23 Encounter for immunization: Secondary | ICD-10-CM | POA: Diagnosis not present

## 2016-03-01 NOTE — Telephone Encounter (Signed)
Patient came in for flu vaccine(nurse visit) today and stated she is allergic to eggs----however she has recd high dose flu vaccine last 3 years with no complications---I have advised dr burns, and per dr burns ok to give high dose flu vaccine, patient advised to take benadryl should a reaction start---see help at ED or can call 911 if office is closed and she feels she is having severe reaction to vaccine

## 2016-03-14 ENCOUNTER — Telehealth: Payer: Self-pay | Admitting: Internal Medicine

## 2016-03-14 ENCOUNTER — Ambulatory Visit (INDEPENDENT_AMBULATORY_CARE_PROVIDER_SITE_OTHER): Payer: PPO | Admitting: Internal Medicine

## 2016-03-14 VITALS — BP 138/80 | HR 77 | Temp 98.0°F | Resp 20 | Wt 151.0 lb

## 2016-03-14 DIAGNOSIS — J3089 Other allergic rhinitis: Secondary | ICD-10-CM | POA: Diagnosis not present

## 2016-03-14 DIAGNOSIS — E119 Type 2 diabetes mellitus without complications: Secondary | ICD-10-CM | POA: Diagnosis not present

## 2016-03-14 DIAGNOSIS — J309 Allergic rhinitis, unspecified: Secondary | ICD-10-CM | POA: Insufficient documentation

## 2016-03-14 NOTE — Assessment & Plan Note (Signed)
Mild to mod,for depomedrol IM, 80 mg, and otc zyrtec and nasacort asd,  to f/u any worsening symptoms or concerns

## 2016-03-14 NOTE — Patient Instructions (Signed)
You had the steroid shot today  Please take all new medication as prescribed - the OTC Zyrtec and Nasacort AQ  You can also take Delsym OTC for cough, and/or Mucinex (or it's generic off brand) for congestion, and tylenol as needed for pain.  Please continue all other medications as before, and refills have been done if requested.  Please have the pharmacy call with any other refills you may need.  Please keep your appointments with your specialists as you may have planned

## 2016-03-14 NOTE — Progress Notes (Signed)
Subjective:    Patient ID: Lisa Mcgee, female    DOB: [birth year], 70 y.o.   MRN: [id number]  HPI  Here for acute visit, has long hx of allergy tx, also s/p T&A, today Does have several wks ongoing nasal allergy symptoms with clearish congestion, itch and sneezing, without fever, pain, ST, cough, swelling or wheezing. Has hx previous allergy shots for 9 yrs, does not want to start this again.  Has old flonase bottle at home but has not tried. Has not taken any OTC, except for mucinex with only slight improvement in congestion.   Pt denies fever, wt loss, night sweats, loss of appetite, or other constitutional symptoms Pt denies chest pain, increased sob or doe, wheezing, orthopnea, PND, increased LE swelling, palpitations, dizziness or syncope.   Pt denies polydipsia, polyuria, Past Medical History:  Diagnosis Date  . Diabetes mellitus without complication (HCC)    diet controlled  . Environmental allergies   . Ganglion cyst of left foot 06/22/2013  . Hyperlipidemia    low HDL   Past Surgical History:  Procedure Laterality Date  . COLONOSCOPY  2007   Adenomatous polyp  . COLONOSCOPY  2014   neg  . TONSILLECTOMY AND ADENOIDECTOMY  age 27    reports that she has never smoked. She has never used smokeless tobacco. She reports that she does not drink alcohol or use drugs. family history includes Allergies in her mother; Alzheimer's disease in her brother; Diabetes in her mother and sister; Heart attack in her father. Allergies  Allergen Reactions  . Atropine   . Belladonna Alkaloids   . Prevnar [Pneumococcal 13-Val Conj Vacc] Other (See Comments)    Itching, redness, and rash  . Eggs Or Egg-Derived Products     Diarrhea,Gas   Current Outpatient Prescriptions on File Prior to Visit  Medication Sig Dispense Refill  . Calcium Carbonate-Vitamin D (CALCIUM 600 + D PO) Take 1 tablet by mouth 2 (two) times daily.     . cholecalciferol (VITAMIN D) 1000 UNITS tablet Take 1,000 Units by  mouth daily.    . Cholecalciferol (VITAMIN D3) 2000 UNITS TABS Take 1 tablet by mouth 2 (two) times daily.     . clobetasol cream (TEMOVATE) [id number] % Apply 1 application topically as needed.    . Coenzyme Q10 (COQ10 PO) Take 300 mg by mouth daily.      . cycloSPORINE (RESTASIS) 0.05 % ophthalmic emulsion Place 1 drop into both eyes 2 (two) times daily.    . fish oil-omega-3 fatty acids 1000 MG capsule Take 2 g by mouth daily.      . Flaxseed, Linseed, (FLAX SEED OIL) 1000 MG CAPS Take 1 capsule by mouth 2 (two) times daily.     . fluticasone (FLONASE) 50 MCG/ACT nasal spray PLACE 2 SPRAYS IN EACH NOSTRIL DAILY    AS NEEDED 16 g 0  . Garlic 123XX123 MG TABS Take 1,000 mg by mouth 2 (two) times daily.     Marland Kitchen glucose blood test strip 1 each by Other route 2 (two) times daily. Use to check blood sugars twice a day. Dx E11.9 100 each 3  . ibuprofen (ADVIL,MOTRIN) 200 MG tablet Take 200 mg by mouth. Take 3 pills prn    . ketoconazole (NIZORAL) 2 % cream Apply 1 application topically 2 (two) times daily as needed for irritation. 15 g 0  . Lancet Device MISC Test blood sugar before each meal and at bedtime. 1 each 11  . Lancets (FREESTYLE)  lancets 1 each by Other route 2 (two) times daily. Use to check blood sugars twice a day Dx E11.9 60 each 11  . Multiple Vitamins-Minerals (HAIR/SKIN/NAILS PO) Take 1 tablet by mouth daily.     . mupirocin ointment (BACTROBAN) 2 % Apply 1 application topically as needed.    . NON FORMULARY [employer] MVI-Take 1 pill bid    . pyridOXINE (VITAMIN B-6) 100 MG tablet Take 100 mg by mouth 2 (two) times daily.     Marland Kitchen triamcinolone cream (KENALOG) 0.1 % Apply 1 application topically as needed.    . vitamin B-12 (CYANOCOBALAMIN) 1000 MCG tablet Take 1,000 mcg by mouth daily.       No current facility-administered medications on file prior to visit.    Review of Systems  All otherwise neg per pt       Objective:   Physical Exam BP 138/80   Pulse 77   Temp 98 F (36.7  C) (Oral)   Resp 20   Wt 151 lb (68.5 kg)   SpO2 98%   BMI 23.65 kg/m  VS noted, not ill appearing Constitutional: Pt appears in no apparent distress HENT: Head: NCAT.  Right Ear: External ear normal.  Left Ear: External ear normal.  Eyes: . Pupils are equal, round, and reactive to light. Conjunctivae and EOM are normal Bilat tm's with mild erythema.  Max sinus areas non tender.  Pharynx with mild erythema, no exudate Neck: Normal range of motion. Neck supple.  Cardiovascular: Normal rate and regular rhythm.   Pulmonary/Chest: Effort normal and breath sounds without rales or wheezing.  Neurological: Pt is alert. Not confused , motor grossly intact Skin: Skin is warm. No rash, no LE edema Psychiatric: Pt behavior is normal. No agitation.      Assessment & Plan:

## 2016-03-14 NOTE — Progress Notes (Signed)
Pre visit review using our clinic review tool, if applicable. No additional management support is needed unless otherwise documented below in the visit note. 

## 2016-03-14 NOTE — Telephone Encounter (Signed)
Patient Name: Lisa Mcgee  DOB: 1945/09/06    Initial Comment Caller states she has a sore throat.   Nurse Assessment  Nurse: Mallie Mussel, RN, Alveta Heimlich Date/Time Eilene Ghazi Time): 03/14/2016 8:31:50 AM  Confirm and document reason for call. If symptomatic, describe symptoms. You must click the next button to save text entered. ---Caller states that she has a sore throat which began yesterday morning. She states that it is allergy related. She rates her pain as 8-9 on 0-10 scale. She has been sneezing, but denies difficulty breathing. She denies fever.  Has the patient traveled out of the country within the last 30 days? ---No  Does the patient have any new or worsening symptoms? ---Yes  Will a triage be completed? ---Yes  Related visit to physician within the last 2 weeks? ---No  Does the PT have any chronic conditions? (i.e. diabetes, asthma, etc.) ---Yes  List chronic conditions. ---Diabetes, Allergies  Is this a behavioral health or substance abuse call? ---No     Guidelines    Guideline Title Affirmed Question Affirmed Notes  Sore Throat SEVERE (e.g., excruciating) throat pain    Final Disposition User   See Physician within [place], RN, Alveta Heimlich    Comments  Caller prefers to see Dr. Quay Burow. She is only working half a day today and no appointments available. I was able to schedule her to see Dr. Cathlean Cower at 4:45pm today.   Referrals  REFERRED TO PCP OFFICE   Disagree/Comply: Comply

## 2016-03-14 NOTE — Assessment & Plan Note (Signed)
stable overall by history and exam, recent data reviewed with pt, and pt to continue medical treatment as before,  to f/u any worsening symptoms or concerns Lab Results  Component Value Date   HGBA1C 6.0 11/07/2015

## 2016-04-23 DIAGNOSIS — H47323 Drusen of optic disc, bilateral: Secondary | ICD-10-CM | POA: Diagnosis not present

## 2016-04-23 DIAGNOSIS — H2513 Age-related nuclear cataract, bilateral: Secondary | ICD-10-CM | POA: Diagnosis not present

## 2016-04-23 DIAGNOSIS — H43813 Vitreous degeneration, bilateral: Secondary | ICD-10-CM | POA: Diagnosis not present

## 2016-04-27 IMAGING — CR DG FOOT COMPLETE 3+V*L*
3 series · 3 of 3 positions shown · non-contrast
Comparison: None.

CLINICAL DATA: Nonhealing wound along the plantar aspect of the
left foot along the arch.

EXAM:
LEFT FOOT - COMPLETE 3+ VIEW

[t foot ap left]
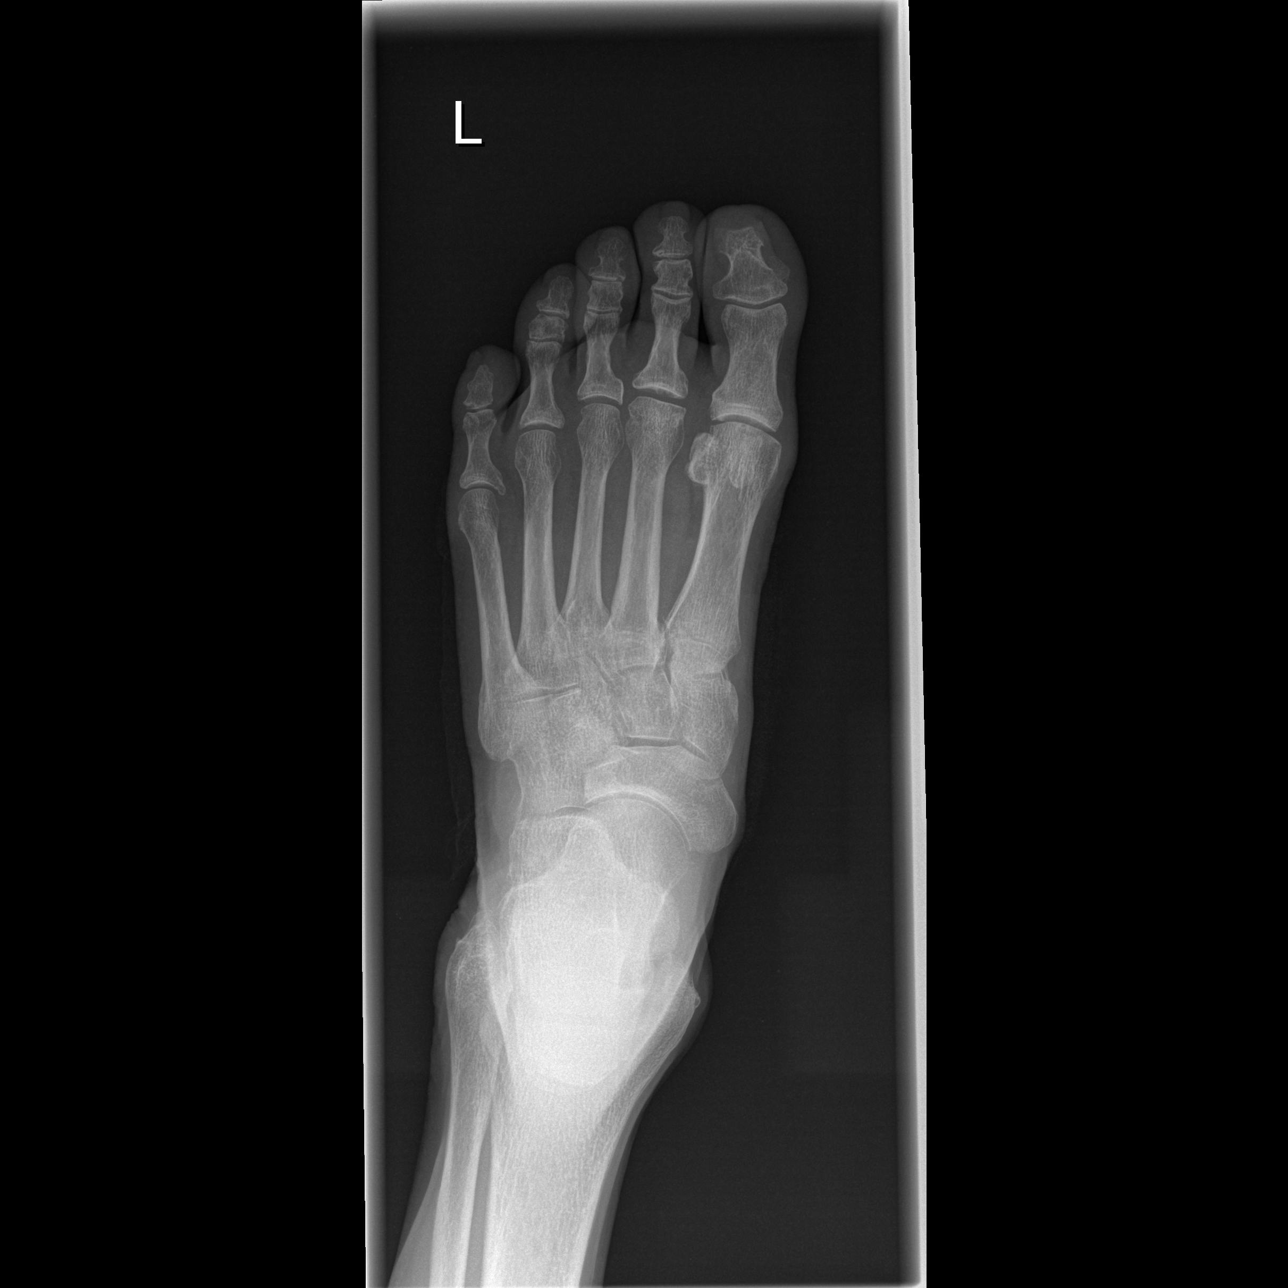

[t foot oblique left]
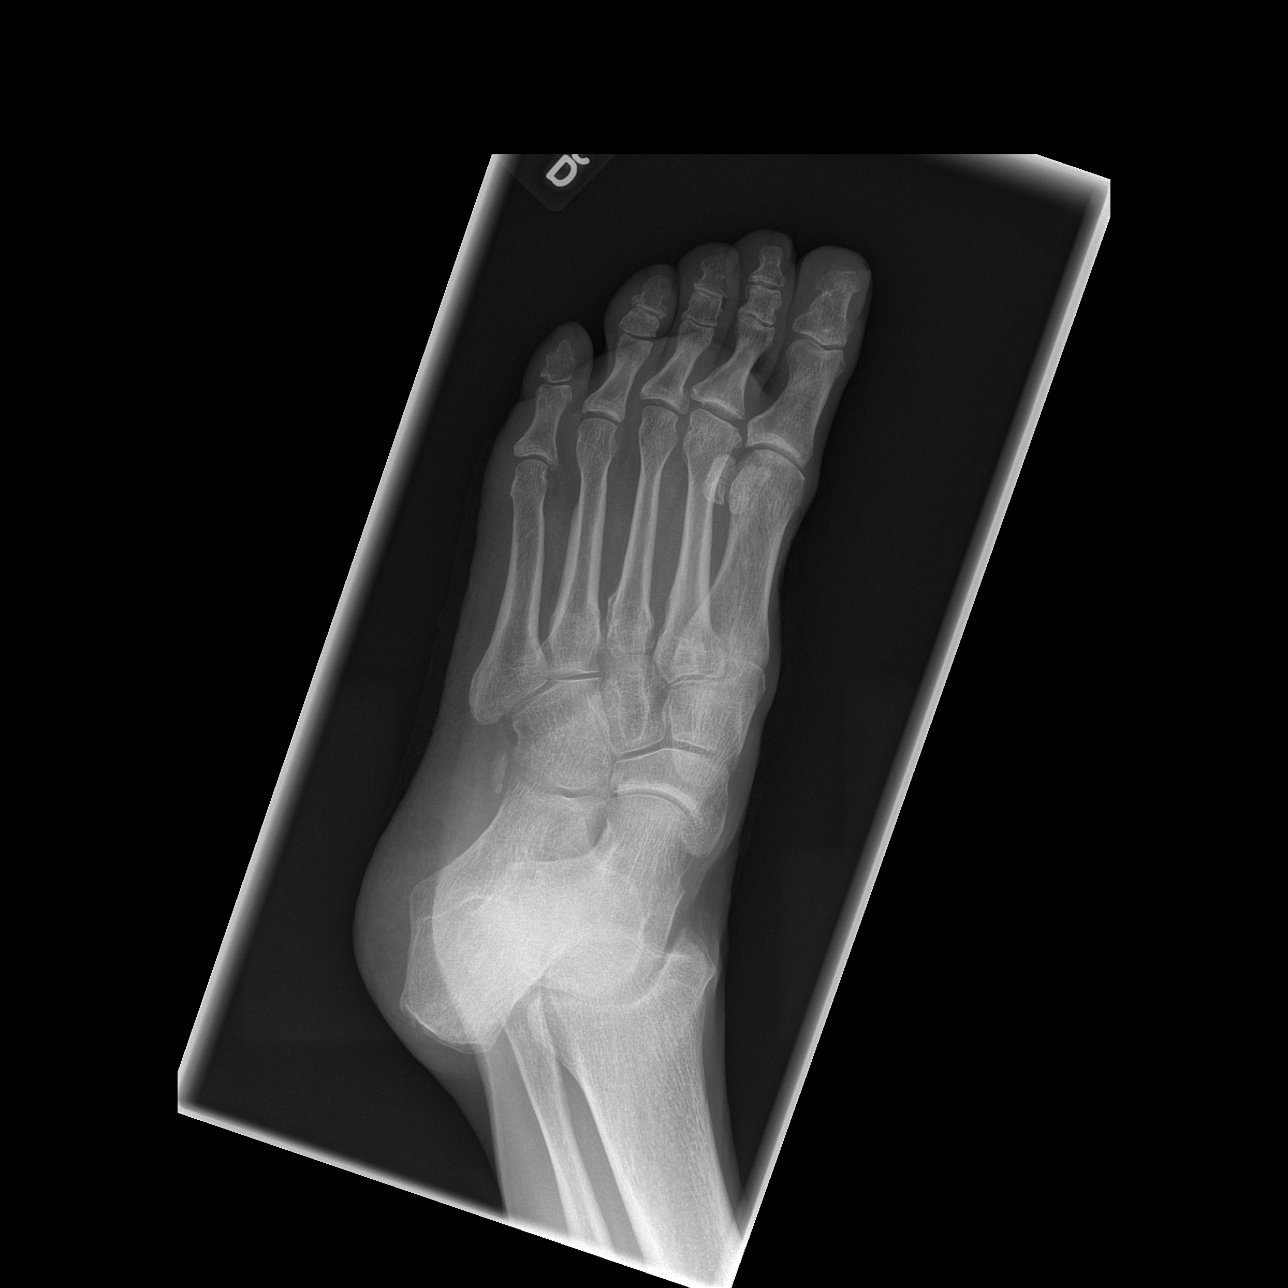

[t foot lat left]
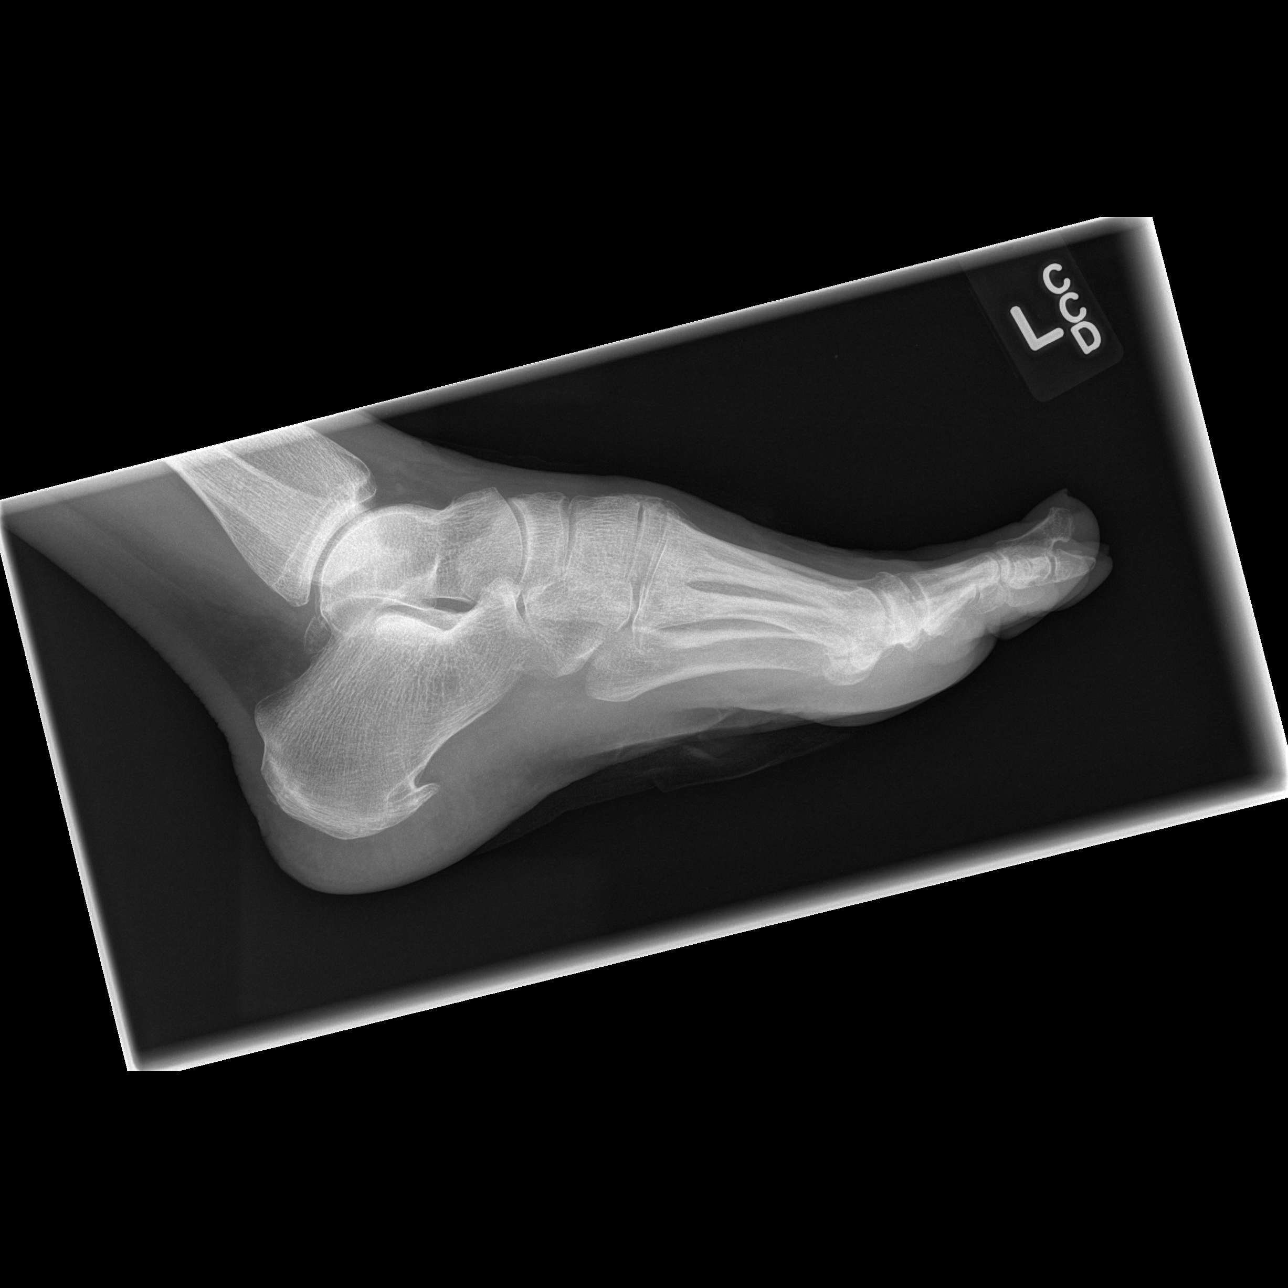

[3 of 3 positions shown; findings below may reference images not displayed]

FINDINGS: There is no evidence of fracture or dislocation. There is
generalized osteopenia. There is no bone destruction or periostitis.
There is flattening of the second metatarsal head which may reflect
old avascular necrosis. Mild degenerative osteoarthritis of the
second MTP joint. There is a plantar calcaneal spur. Soft tissues
are unremarkable.
IMPRESSION: No radiographic evidence of osteomyelitis of the left foot appear

## 2016-05-01 DIAGNOSIS — M65332 Trigger finger, left middle finger: Secondary | ICD-10-CM | POA: Diagnosis not present

## 2016-05-01 DIAGNOSIS — M79645 Pain in left finger(s): Secondary | ICD-10-CM | POA: Diagnosis not present

## 2016-05-22 DIAGNOSIS — Z6823 Body mass index (BMI) 23.0-23.9, adult: Secondary | ICD-10-CM | POA: Diagnosis not present

## 2016-05-22 DIAGNOSIS — Z01419 Encounter for gynecological examination (general) (routine) without abnormal findings: Secondary | ICD-10-CM | POA: Diagnosis not present

## 2016-05-27 DIAGNOSIS — Z1231 Encounter for screening mammogram for malignant neoplasm of breast: Secondary | ICD-10-CM | POA: Diagnosis not present

## 2016-05-29 DIAGNOSIS — M65332 Trigger finger, left middle finger: Secondary | ICD-10-CM | POA: Diagnosis not present

## 2016-05-29 DIAGNOSIS — M79645 Pain in left finger(s): Secondary | ICD-10-CM | POA: Diagnosis not present

## 2016-06-24 DIAGNOSIS — M79645 Pain in left finger(s): Secondary | ICD-10-CM | POA: Diagnosis not present

## 2016-06-24 DIAGNOSIS — M65332 Trigger finger, left middle finger: Secondary | ICD-10-CM | POA: Diagnosis not present

## 2016-08-15 DIAGNOSIS — M65332 Trigger finger, left middle finger: Secondary | ICD-10-CM | POA: Diagnosis not present

## 2016-08-15 HISTORY — PX: TRIGGER FINGER RELEASE: SHX641

## 2016-08-22 DIAGNOSIS — M65332 Trigger finger, left middle finger: Secondary | ICD-10-CM | POA: Diagnosis not present

## 2016-08-27 DIAGNOSIS — M65332 Trigger finger, left middle finger: Secondary | ICD-10-CM | POA: Diagnosis not present

## 2016-11-07 ENCOUNTER — Encounter: Payer: Self-pay | Admitting: Internal Medicine

## 2016-11-07 ENCOUNTER — Ambulatory Visit (INDEPENDENT_AMBULATORY_CARE_PROVIDER_SITE_OTHER): Payer: PPO | Admitting: Internal Medicine

## 2016-11-07 ENCOUNTER — Other Ambulatory Visit (INDEPENDENT_AMBULATORY_CARE_PROVIDER_SITE_OTHER): Payer: PPO

## 2016-11-07 VITALS — BP 140/64 | HR 73 | Temp 98.7°F | Resp 16 | Ht 67.0 in | Wt 153.0 lb

## 2016-11-07 DIAGNOSIS — Z0000 Encounter for general adult medical examination without abnormal findings: Secondary | ICD-10-CM | POA: Diagnosis not present

## 2016-11-07 DIAGNOSIS — E119 Type 2 diabetes mellitus without complications: Secondary | ICD-10-CM

## 2016-11-07 DIAGNOSIS — M8580 Other specified disorders of bone density and structure, unspecified site: Secondary | ICD-10-CM | POA: Diagnosis not present

## 2016-11-07 LAB — LIPID PANEL
CHOL/HDL RATIO: 3
Cholesterol: 146 mg/dL (ref 0–200)
HDL: 51.9 mg/dL (ref >39.00)
LDL CALC: 82 mg/dL (ref 0–99)
NonHDL: 93.76
Triglycerides: 61 mg/dL (ref 0.0–149.0)
VLDL: 12.2 mg/dL (ref 0.0–40.0)

## 2016-11-07 LAB — CBC WITH DIFFERENTIAL/PLATELET
BASOS ABS: 0 10*3/uL (ref 0.0–0.1)
Basophils Relative: 0.5 % (ref 0.0–3.0)
Eosinophils Absolute: 0.1 10*3/uL (ref 0.0–0.7)
Eosinophils Relative: 1.8 % (ref 0.0–5.0)
HCT: 40.6 % (ref 36.0–46.0)
Hemoglobin: 13.9 g/dL (ref 12.0–15.0)
LYMPHS ABS: 2.6 10*3/uL (ref 0.7–4.0)
Lymphocytes Relative: 34.8 % (ref 12.0–46.0)
MCHC: 34.2 g/dL (ref 30.0–36.0)
MCV: 86.2 fl (ref 78.0–100.0)
MONOS PCT: 6.9 % (ref 3.0–12.0)
Monocytes Absolute: 0.5 10*3/uL (ref 0.1–1.0)
NEUTROS ABS: 4.2 10*3/uL (ref 1.4–7.7)
NEUTROS PCT: 56 % (ref 43.0–77.0)
PLATELETS: 182 10*3/uL (ref 150.0–400.0)
RBC: 4.71 Mil/uL (ref 3.87–5.11)
RDW: 13.3 % (ref 11.5–15.5)
WBC: 7.5 10*3/uL (ref 4.0–10.5)

## 2016-11-07 LAB — COMPREHENSIVE METABOLIC PANEL
ALT: 13 U/L (ref 0–35)
AST: 20 U/L (ref 0–37)
Albumin: 4.5 g/dL (ref 3.5–5.2)
Alkaline Phosphatase: 68 U/L (ref 39–117)
BUN: 16 mg/dL (ref 6–23)
CHLORIDE: 105 meq/L (ref 96–112)
CO2: 27 meq/L (ref 19–32)
Calcium: 9.5 mg/dL (ref 8.4–10.5)
Creatinine, Ser: 0.86 mg/dL (ref 0.40–1.20)
GFR: 69.61 mL/min (ref >60.00)
GLUCOSE: 155 mg/dL — AB (ref 70–99)
POTASSIUM: 4.7 meq/L (ref 3.5–5.1)
Sodium: 137 mEq/L (ref 135–145)
Total Bilirubin: 0.3 mg/dL (ref 0.2–1.2)
Total Protein: 7.5 g/dL (ref 6.0–8.3)

## 2016-11-07 LAB — TSH: TSH: 0.86 u[IU]/mL (ref 0.35–4.50)

## 2016-11-07 LAB — HEMOGLOBIN A1C: Hgb A1c MFr Bld: 6.4 % (ref 4.6–6.5)

## 2016-11-07 LAB — MICROALBUMIN / CREATININE URINE RATIO
Creatinine,U: 86.2 mg/dL
Microalb Creat Ratio: 0.8 mg/g (ref 0.0–30.0)
Microalb, Ur: 0.7 mg/dL (ref 0.0–1.9)

## 2016-11-07 NOTE — Assessment & Plan Note (Signed)
DEXA up-to-date-done by GYN, 05/2015 Continue vitamin D supplementation Continue regular exercise

## 2016-11-07 NOTE — Patient Instructions (Addendum)
Lisa Mcgee , Thank you for taking time to come for your Medicare Wellness Visit. I appreciate your ongoing commitment to your health goals. Please review the following plan we discussed and let me know if I can assist you in the future.   These are the goals we discussed: Goals    Increase exercise to 3 times per week      This is a list of the screening recommended for you and due dates:  Health Maintenance  Topic Date Due  . Eye exam for diabetics  03/19/2016  . Hemoglobin A1C  05/09/2016  . Complete foot exam   11/06/2016  . Urine Protein Check  11/06/2016  . Flu Shot  01/08/2017  . Mammogram  04/23/2017  . Tetanus Vaccine  04/20/2018  . DEXA scan (bone density measurement)  05/15/2018  . Colon Cancer Screening  05/06/2023  .  Hepatitis C: One time screening is recommended by Center for Disease Control  (CDC) for  adults born from [birth year] through 1963.   Completed  . Pneumonia vaccines  Completed    Test(s) ordered today. Your results will be released to [place] (or called to you) after review, usually within 72hours after test completion. If any changes need to be made, you will be notified at that same time.  All other Health Maintenance issues reviewed.   All recommended immunizations and age-appropriate screenings are up-to-date or discussed.  No immunizations administered today.   Medications reviewed and updated.  No changes recommended at this time.   Please followup in one year   Health Maintenance, Female Adopting a healthy lifestyle and getting preventive care can go a long way to promote health and wellness. Talk with your health care provider about what schedule of regular examinations is right for you. This is a good chance for you to check in with your provider about disease prevention and staying healthy. In between checkups, there are plenty of things you can do on your own. Experts have done a lot of research about which lifestyle changes and preventive measures  are most likely to keep you healthy. Ask your health care provider for more information. Weight and diet Eat a healthy diet  Be sure to include plenty of vegetables, fruits, low-fat dairy products, and lean protein.  Do not eat a lot of foods high in solid fats, added sugars, or salt.  Get regular exercise. This is one of the most important things you can do for your health. ? Most adults should exercise for at least 150 minutes each week. The exercise should increase your heart rate and make you sweat (moderate-intensity exercise). ? Most adults should also do strengthening exercises at least twice a week. This is in addition to the moderate-intensity exercise.  Maintain a healthy weight  Body mass index (BMI) is a measurement that can be used to identify possible weight problems. It estimates body fat based on height and weight. Your health care provider can help determine your BMI and help you achieve or maintain a healthy weight.  For females 76 years of age and older: ? A BMI below 18.5 is considered underweight. ? A BMI of 18.5 to 24.9 is normal. ? A BMI of 25 to 29.9 is considered overweight. ? A BMI of 30 and above is considered obese.  Watch levels of cholesterol and blood lipids  You should start having your blood tested for lipids and cholesterol at 69 years of age, then have this test every 5 years.  You  may need to have your cholesterol levels checked more often if: ? Your lipid or cholesterol levels are high. ? You are older than 69 years of age. ? You are at high risk for heart disease.  Cancer screening Lung Cancer  Lung cancer screening is recommended for adults 28-46 years old who are at high risk for lung cancer because of a history of smoking.  A yearly low-dose CT scan of the lungs is recommended for people who: ? Currently smoke. ? Have quit within the past 15 years. ? Have at least a 30-pack-year history of smoking. A pack year is smoking an average of  one pack of cigarettes a day for 1 year.  Yearly screening should continue until it has been 15 years since you quit.  Yearly screening should stop if you develop a health problem that would prevent you from having lung cancer treatment.  Breast Cancer  Practice breast self-awareness. This means understanding how your breasts normally appear and feel.  It also means doing regular breast self-exams. Let your health care provider know about any changes, no matter how small.  If you are in your 20s or 30s, you should have a clinical breast exam (CBE) by a health care provider every 1-3 years as part of a regular health exam.  If you are 60 or older, have a CBE every year. Also consider having a breast X-ray (mammogram) every year.  If you have a family history of breast cancer, talk to your health care provider about genetic screening.  If you are at high risk for breast cancer, talk to your health care provider about having an MRI and a mammogram every year.  Breast cancer gene (BRCA) assessment is recommended for women who have family members with BRCA-related cancers. BRCA-related cancers include: ? Breast. ? Ovarian. ? Tubal. ? Peritoneal cancers.  Results of the assessment will determine the need for genetic counseling and BRCA1 and BRCA2 testing.  Cervical Cancer Your health care provider may recommend that you be screened regularly for cancer of the pelvic organs (ovaries, uterus, and vagina). This screening involves a pelvic examination, including checking for microscopic changes to the surface of your cervix (Pap test). You may be encouraged to have this screening done every 3 years, beginning at age 61.  For women ages 21-65, health care providers may recommend pelvic exams and Pap testing every 3 years, or they may recommend the Pap and pelvic exam, combined with testing for human papilloma virus (HPV), every 5 years. Some types of HPV increase your risk of cervical cancer.  Testing for HPV may also be done on women of any age with unclear Pap test results.  Other health care providers may not recommend any screening for nonpregnant women who are considered low risk for pelvic cancer and who do not have symptoms. Ask your health care provider if a screening pelvic exam is right for you.  If you have had past treatment for cervical cancer or a condition that could lead to cancer, you need Pap tests and screening for cancer for at least 20 years after your treatment. If Pap tests have been discontinued, your risk factors (such as having a new sexual partner) need to be reassessed to determine if screening should resume. Some women have medical problems that increase the chance of getting cervical cancer. In these cases, your health care provider may recommend more frequent screening and Pap tests.  Colorectal Cancer  This type of cancer can be detected and  often prevented.  Routine colorectal cancer screening usually begins at 69 years of age and continues through 69 years of age.  Your health care provider may recommend screening at an earlier age if you have risk factors for colon cancer.  Your health care provider may also recommend using home test kits to check for hidden blood in the stool.  A small camera at the end of a tube can be used to examine your colon directly (sigmoidoscopy or colonoscopy). This is done to check for the earliest forms of colorectal cancer.  Routine screening usually begins at age 66.  Direct examination of the colon should be repeated every 5-10 years through 69 years of age. However, you may need to be screened more often if early forms of precancerous polyps or small growths are found.  Skin Cancer  Check your skin from head to toe regularly.  Tell your health care provider about any new moles or changes in moles, especially if there is a change in a mole's shape or color.  Also tell your health care provider if you have a mole  that is larger than the size of a pencil eraser.  Always use sunscreen. Apply sunscreen liberally and repeatedly throughout the day.  Protect yourself by wearing long sleeves, pants, a wide-brimmed hat, and sunglasses whenever you are outside.  Heart disease, diabetes, and high blood pressure  High blood pressure causes heart disease and increases the risk of stroke. High blood pressure is more likely to develop in: ? People who have blood pressure in the high end of the normal range (130-139/85-89 mm Hg). ? People who are overweight or obese. ? People who are African American.  If you are 75-6 years of age, have your blood pressure checked every 3-5 years. If you are 60 years of age or older, have your blood pressure checked every year. You should have your blood pressure measured twice-once when you are at a hospital or clinic, and once when you are not at a hospital or clinic. Record the average of the two measurements. To check your blood pressure when you are not at a hospital or clinic, you can use: ? An automated blood pressure machine at a pharmacy. ? A home blood pressure monitor.  If you are between 75 years and 72 years old, ask your health care provider if you should take aspirin to prevent strokes.  Have regular diabetes screenings. This involves taking a blood sample to check your fasting blood sugar level. ? If you are at a normal weight and have a low risk for diabetes, have this test once every three years after 69 years of age. ? If you are overweight and have a high risk for diabetes, consider being tested at a younger age or more often. Preventing infection Hepatitis B  If you have a higher risk for hepatitis B, you should be screened for this virus. You are considered at high risk for hepatitis B if: ? You were born in a country where hepatitis B is common. Ask your health care provider which countries are considered high risk. ? Your parents were born in a high-risk  country, and you have not been immunized against hepatitis B (hepatitis B vaccine). ? You have HIV or AIDS. ? You use needles to inject street drugs. ? You live with someone who has hepatitis B. ? You have had sex with someone who has hepatitis B. ? You get hemodialysis treatment. ? You take certain medicines for conditions,  including cancer, organ transplantation, and autoimmune conditions.  Hepatitis C  Blood testing is recommended for: ? Everyone born from [birth year] through 1963. ? Anyone with known risk factors for hepatitis C.  Sexually transmitted infections (STIs)  You should be screened for sexually transmitted infections (STIs) including gonorrhea and chlamydia if: ? You are sexually active and are younger than 69 years of age. ? You are older than 69 years of age and your health care provider tells you that you are at risk for this type of infection. ? Your sexual activity has changed since you were last screened and you are at an increased risk for chlamydia or gonorrhea. Ask your health care provider if you are at risk.  If you do not have HIV, but are at risk, it may be recommended that you take a prescription medicine daily to prevent HIV infection. This is called pre-exposure prophylaxis (PrEP). You are considered at risk if: ? You are sexually active and do not regularly use condoms or know the HIV status of your partner(s). ? You take drugs by injection. ? You are sexually active with a partner who has HIV.  Talk with your health care provider about whether you are at high risk of being infected with HIV. If you choose to begin PrEP, you should first be tested for HIV. You should then be tested every 3 months for as long as you are taking PrEP. Pregnancy  If you are premenopausal and you may become pregnant, ask your health care provider about preconception counseling.  If you may become pregnant, take 400 to 800 micrograms (mcg) of folic acid every day.  If you want to  prevent pregnancy, talk to your health care provider about birth control (contraception). Osteoporosis and menopause  Osteoporosis is a disease in which the bones lose minerals and strength with aging. This can result in serious bone fractures. Your risk for osteoporosis can be identified using a bone density scan.  If you are 91 years of age or older, or if you are at risk for osteoporosis and fractures, ask your health care provider if you should be screened.  Ask your health care provider whether you should take a calcium or vitamin D supplement to lower your risk for osteoporosis.  Menopause may have certain physical symptoms and risks.  Hormone replacement therapy may reduce some of these symptoms and risks. Talk to your health care provider about whether hormone replacement therapy is right for you. Follow these instructions at home:  Schedule regular health, dental, and eye exams.  Stay current with your immunizations.  Do not use any tobacco products including cigarettes, chewing tobacco, or electronic cigarettes.  If you are pregnant, do not drink alcohol.  If you are breastfeeding, limit how much and how often you drink alcohol.  Limit alcohol intake to no more than 1 drink per day for nonpregnant women. One drink equals 12 ounces of beer, 5 ounces of wine, or 1 ounces of hard liquor.  Do not use street drugs.  Do not share needles.  Ask your health care provider for help if you need support or information about quitting drugs.  Tell your health care provider if you often feel depressed.  Tell your health care provider if you have ever been abused or do not feel safe at home. This information is not intended to replace advice given to you by your health care provider. Make sure you discuss any questions you have with your health care provider. Document  Released: 12/10/2010 Document Revised: 11/02/2015 Document Reviewed: 02/28/2015 Elsevier Interactive Patient  Education  [employer].

## 2016-11-07 NOTE — Assessment & Plan Note (Signed)
Check a1c Continue low sugar/carbohydrate diet and regular exercise Check urine microalbumin

## 2016-11-07 NOTE — Progress Notes (Signed)
Subjective:    Patient ID: Lisa Mcgee, female    DOB: August 20, 1945, 71 y.o.   MRN: [id number]  HPI Here for medicare wellness exam and an annual physical exam.   I have personally reviewed and have noted 1.The patient's medical and social history 2.Their use of alcohol, tobacco or illicit drugs 3.Their current medications and supplements 4.The patient's functional ability including ADL's, fall risks, home safety risks and hearing or visual impairment. 5.Diet and physical activities 6.Evidence for depression or mood disorders 7.Care team reviewed  - Eye - Dr Heather [place], Gyn - Dr Matthew Saras, [place] - Dr Allyson Sabal   Are there smokers in your home (other than you)? No  Risk Factors Exercise: goes to gym regularly - 2/week water aerobics Dietary issues discussed: low sugar/carb diet, well balanced  Cardiac risk factors: advanced age   Depression Screen  Have you felt down, depressed or hopeless? No  Have you felt little interest or pleasure in doing things?  No  Activities of Daily Living In your present state of health, do you have any difficulty performing the following activities?:  Driving? No Managing money?  No Feeding yourself? No Getting from bed to chair? No Climbing a flight of stairs? No Preparing food and eating?: No Bathing or showering? No Getting dressed: No Getting to/using the toilet? No Moving around from place to place: No In the past year have you fallen or had a near fall?: No   Are you sexually active?  No  Do you have more than one partner?  N/A  Hearing Difficulties: No Do you often ask people to speak up or repeat themselves? No Do you experience ringing or noises in your ears? yes Do you have difficulty understanding soft or whispered voices? No Vision:              Any change in vision: no             Up to date with eye exam:  yes Memory:  Do you feel that you have a problem  with memory? No  Do you often misplace items? No  Do you feel safe at home?  Yes  Cognitive Testing  Alert, Orientated? Yes  Normal Appearance? Yes  Recall of three objects?  Yes  Can perform simple calculations? Yes  Displays appropriate judgment? Yes  Can read the correct time from a watch face? Yes   Advanced Directives have been discussed with the patient? Yes   Medications and allergies reviewed with patient and updated if appropriate.  Patient Active Problem List   Diagnosis Date Noted  . Allergic rhinitis 03/14/2016  . Pes cavus 04/11/2014  . Vitamin D deficiency 08/04/2013  . Osteopenia 04/17/2013  . Rhinitis, non-allergic 05/26/2011  . SLEEP DISORDER 08/27/2010  . SYNCOPE 06/28/2008  . Palpitations 06/28/2008  . Hyperlipidemia 04/20/2008  . History of colonic polyps 04/20/2008  . Diabetes type 2, controlled ([place]) 05/28/2007    Current Outpatient Prescriptions on File Prior to Visit  Medication Sig Dispense Refill  . Calcium Carbonate-Vitamin D (CALCIUM 600 + D PO) Take 1 tablet by mouth 2 (two) times daily.     . cholecalciferol (VITAMIN D) 1000 UNITS tablet Take 1,000 Units by mouth daily.    . Cholecalciferol (VITAMIN D3) 2000 UNITS TABS Take 1 tablet by mouth 2 (two) times daily.     . clobetasol cream (TEMOVATE) 1.88 % Apply 1 application topically as needed.    . Coenzyme Q10 (COQ10 PO) Take 300  mg by mouth daily.      . fish oil-omega-3 fatty acids 1000 MG capsule Take 2 g by mouth daily.      . Flaxseed, Linseed, (FLAX SEED OIL) 1000 MG CAPS Take 1 capsule by mouth 2 (two) times daily.     . fluticasone (FLONASE) 50 MCG/ACT nasal spray PLACE 2 SPRAYS IN EACH NOSTRIL DAILY    AS NEEDED 16 g 0  . Garlic 299 MG TABS Take 1,000 mg by mouth 2 (two) times daily.     Marland Kitchen glucose blood test strip 1 each by Other route 2 (two) times daily. Use to check blood sugars twice a day. Dx E11.9 100 each 3  . ibuprofen (ADVIL,MOTRIN) 200 MG tablet Take 200 mg by mouth. Take 3  pills prn    . ketoconazole (NIZORAL) 2 % cream Apply 1 application topically 2 (two) times daily as needed for irritation. 15 g 0  . Lancet Device MISC Test blood sugar before each meal and at bedtime. 1 each 11  . Lancets (FREESTYLE) lancets 1 each by Other route 2 (two) times daily. Use to check blood sugars twice a day Dx E11.9 60 each 11  . Multiple Vitamins-Minerals (HAIR/SKIN/NAILS PO) Take 1 tablet by mouth daily.     . mupirocin ointment (BACTROBAN) 2 % Apply 1 application topically as needed.    . NON FORMULARY Enhanced [employer] MVI-Take 1 pill bid    . pyridOXINE (VITAMIN B-6) 100 MG tablet Take 100 mg by mouth 2 (two) times daily.     Marland Kitchen triamcinolone cream (KENALOG) 0.1 % Apply 1 application topically as needed.    . vitamin B-12 (CYANOCOBALAMIN) 1000 MCG tablet Take 1,000 mcg by mouth daily.       No current facility-administered medications on file prior to visit.     Past Medical History:  Diagnosis Date  . Diabetes mellitus without complication (HCC)    diet controlled  . Environmental allergies   . Ganglion cyst of left foot 06/22/2013  . Hyperlipidemia    low HDL    Past Surgical History:  Procedure Laterality Date  . COLONOSCOPY  2007   Adenomatous polyp  . COLONOSCOPY  2014   neg  . TONSILLECTOMY AND ADENOIDECTOMY  age 27    Social History   Social History  . Marital status: Married    Spouse name: N/A  . Number of children: N/A  . Years of education: N/A   Social History Main Topics  . Smoking status: Never Smoker  . Smokeless tobacco: Never Used  . Alcohol use No  . Drug use: No  . Sexual activity: Not on file   Other Topics Concern  . Not on file   Social History Narrative   Exercise:  Water aerobics twice a week    Family History  Problem Relation Age of Onset  . Allergies Mother   . Diabetes Mother   . Heart attack Father        > 30  . Stroke Neg Hx   . Cancer Neg Hx   . Diabetes Sister   . Alzheimer's disease Brother      Review of Systems  Constitutional: Negative for chills and fever.  HENT: Positive for tinnitus (chronic). Negative for hearing loss.   Eyes: Negative for visual disturbance.  Respiratory: Negative for cough, shortness of breath and wheezing.   Cardiovascular: Negative for chest pain, palpitations and leg swelling.  Gastrointestinal: Negative for abdominal pain, blood in stool, constipation, diarrhea and  nausea.       No gerd  Genitourinary: Negative for dysuria and hematuria.  Musculoskeletal: Negative for arthralgias and back pain.  Skin: Negative for color change and rash.  Neurological: Negative for dizziness, light-headedness and headaches.  Psychiatric/Behavioral: Negative for dysphoric mood. The patient is not nervous/anxious.        Objective:   Vitals:   11/07/16 0754  BP: 140/64  Pulse: 73  Resp: 16  Temp: 98.7 F (37.1 C)   Filed Weights   11/07/16 0754  Weight: 153 lb (69.4 kg)   Body mass index is 23.96 kg/m.  Wt Readings from Last 3 Encounters:  11/07/16 153 lb (69.4 kg)  03/14/16 151 lb (68.5 kg)  11/07/15 149 lb (67.6 kg)     Physical Exam Constitutional: She appears well-developed and well-nourished. No distress.  HENT:  Head: Normocephalic and atraumatic.  Right Ear: External ear normal. Normal ear canal and TM Left Ear: External ear normal.  Normal ear canal and TM Mouth/Throat: Oropharynx is clear and moist.  Eyes: Conjunctivae and EOM are normal.  Neck: Neck supple. No tracheal deviation present. No thyromegaly present.  No carotid bruit  Cardiovascular: Normal rate, regular rhythm and normal heart sounds.   No murmur heard.  No edema. Pulmonary/Chest: Effort normal and breath sounds normal. No respiratory distress. She has no wheezes. She has no rales.  Breast: deferred to Gyn Abdominal: Soft. She exhibits no distension. There is no tenderness.  Lymphadenopathy: She has no cervical adenopathy.  Skin: Skin is warm and dry. She is not  diaphoretic.  Psychiatric: She has a normal mood and affect. Her behavior is normal.         Assessment & Plan:   Wellness Exam: Immunizations   Up to date, discussed new shingles vaccine Colonoscopy  Up to date  Mammogram   Up to date  89 - Dr Matthew Saras Dexa   Up to date  - done 05/2015 Eye exams  - Up to date  - will get report Hearing loss  none Memory concerns/difficulties  none Independent of ADLs    fully Stressed the importance of regular exercise   Patient received copy of preventative screening tests/immunizations recommended for the next 5-10 years.  Physical exam: Screening blood work  ordered Immunizations   Up to date, discussed new shingles vaccine Colonoscopy  Up to date  Mammogram   Up to date  62 - Dr Matthew Saras Dexa   Up to date  - done 05/2015 Eye exams  Up to date - will get report EKG  Last done 11/07/2015 Exercise  Regular- will try to increase to 3/week Weight  - normal BMi Skin   Sees derm annually - Dr Allyson Sabal Substance abuse  none  See Problem List for Assessment and Plan of chronic medical problems.

## 2016-12-06 ENCOUNTER — Ambulatory Visit (INDEPENDENT_AMBULATORY_CARE_PROVIDER_SITE_OTHER): Payer: PPO | Admitting: Internal Medicine

## 2016-12-06 ENCOUNTER — Encounter: Payer: Self-pay | Admitting: Internal Medicine

## 2016-12-06 ENCOUNTER — Other Ambulatory Visit (INDEPENDENT_AMBULATORY_CARE_PROVIDER_SITE_OTHER): Payer: PPO

## 2016-12-06 VITALS — BP 162/68 | HR 88 | Temp 98.1°F | Resp 16 | Wt 160.0 lb

## 2016-12-06 DIAGNOSIS — R300 Dysuria: Secondary | ICD-10-CM

## 2016-12-06 DIAGNOSIS — J0100 Acute maxillary sinusitis, unspecified: Secondary | ICD-10-CM | POA: Diagnosis not present

## 2016-12-06 DIAGNOSIS — H00011 Hordeolum externum right upper eyelid: Secondary | ICD-10-CM | POA: Diagnosis not present

## 2016-12-06 DIAGNOSIS — J019 Acute sinusitis, unspecified: Secondary | ICD-10-CM | POA: Insufficient documentation

## 2016-12-06 LAB — URINALYSIS, ROUTINE W REFLEX MICROSCOPIC
BILIRUBIN URINE: NEGATIVE
Ketones, ur: NEGATIVE
LEUKOCYTES UA: NEGATIVE
NITRITE: NEGATIVE
Specific Gravity, Urine: <=1.005 — AB (ref 1.000–1.030)
Total Protein, Urine: NEGATIVE
UROBILINOGEN UA: 0.2 (ref 0.0–1.0)
Urine Glucose: NEGATIVE
pH: 6 (ref 5.0–8.0)

## 2016-12-06 MED ORDER — AMOXICILLIN 500 MG PO CAPS: 500.0000 mg | ORAL_CAPSULE | Freq: Three times a day (TID) | ORAL | 0 refills | Status: DC

## 2016-12-06 NOTE — Progress Notes (Signed)
Subjective:    Patient ID: Lisa Mcgee, female    DOB: 1946/05/18, 71 y.o.   MRN: [id number]  HPI She is here for an acute visit.   She had a stye in the right eye on the lower lid and She saw her eye doctor. The stye did go away with warm compresses and stye medication. She was told by her eye doctor she had another one in the upper eye lid. That one is still present, but has gotten better. She is using warm compresses and applying the over-the-counter stye medication. She denies any changes in her vision or eye pain. The upper eyelid is red and slightly swollen in that started after she started using the warm compresses.  She feels like she is coming down with something and thinks it may be her sinuses. She thinks she needs an antibiotic. She has had some chills and subjective fevers. She has rhinorrhea, intermittent sinus pressure, sneezing, sore throat, mild headaches at times and nausea. She did have some diarrhea last night, but feels that is related to taking too much vitamin C. She will take a lot of vitamin C to help get rid of infection and typically gets diarrhea.  She denies nasal congestion, ear pain, although they do itch, sinus pain, cough, wheeze, shortness of breath, abdominal pain.  She also notes some mild dysuria at times and wonders about possible urinary tract infection. She denies any blood in the urine or increased urinary frequency.   She has taken tylenol, ibuprofen and mucinex.   Medications and allergies reviewed with patient and updated if appropriate.  Patient Active Problem List   Diagnosis Date Noted  . Allergic rhinitis 03/14/2016  . Pes cavus 04/11/2014  . Vitamin D deficiency 08/04/2013  . Osteopenia 04/17/2013  . Rhinitis, non-allergic 05/26/2011  . SLEEP DISORDER 08/27/2010  . SYNCOPE 06/28/2008  . Palpitations 06/28/2008  . Hyperlipidemia 04/20/2008  . History of colonic polyps 04/20/2008  . Diabetes type 2, controlled ([place]) 05/28/2007     Current Outpatient Prescriptions on File Prior to Visit  Medication Sig Dispense Refill  . Calcium Carbonate-Vitamin D (CALCIUM 600 + D PO) Take 1 tablet by mouth 2 (two) times daily.     . cholecalciferol (VITAMIN D) 1000 UNITS tablet Take 1,000 Units by mouth daily.    . Cholecalciferol (VITAMIN D3) 2000 UNITS TABS Take 1 tablet by mouth 2 (two) times daily.     . clobetasol cream (TEMOVATE) 0.17 % Apply 1 application topically as needed.    . Coenzyme Q10 (COQ10 PO) Take 300 mg by mouth daily.      . fish oil-omega-3 fatty acids 1000 MG capsule Take 2 g by mouth daily.      . Flaxseed, Linseed, (FLAX SEED OIL) 1000 MG CAPS Take 1 capsule by mouth 2 (two) times daily.     . fluticasone (FLONASE) 50 MCG/ACT nasal spray PLACE 2 SPRAYS IN EACH NOSTRIL DAILY    AS NEEDED 16 g 0  . Garlic 494 MG TABS Take 1,000 mg by mouth 2 (two) times daily.     Marland Kitchen glucose blood test strip 1 each by Other route 2 (two) times daily. Use to check blood sugars twice a day. Dx E11.9 100 each 3  . ibuprofen (ADVIL,MOTRIN) 200 MG tablet Take 200 mg by mouth. Take 3 pills prn    . ketoconazole (NIZORAL) 2 % cream Apply 1 application topically 2 (two) times daily as needed for irritation. 15 g 0  .  Lancet Device MISC Test blood sugar before each meal and at bedtime. 1 each 11  . Lancets (FREESTYLE) lancets 1 each by Other route 2 (two) times daily. Use to check blood sugars twice a day Dx E11.9 60 each 11  . Multiple Vitamins-Minerals (HAIR/SKIN/NAILS PO) Take 1 tablet by mouth daily.     . mupirocin ointment (BACTROBAN) 2 % Apply 1 application topically as needed.    . NON FORMULARY Enhanced Energy MVI-Take 1 pill bid    . pyridOXINE (VITAMIN B-6) 100 MG tablet Take 100 mg by mouth 2 (two) times daily.     Marland Kitchen triamcinolone cream (KENALOG) 0.1 % Apply 1 application topically as needed.    . vitamin B-12 (CYANOCOBALAMIN) 1000 MCG tablet Take 1,000 mcg by mouth daily.       No current facility-administered  medications on file prior to visit.     Past Medical History:  Diagnosis Date  . Diabetes mellitus without complication (HCC)    diet controlled  . Environmental allergies   . Ganglion cyst of left foot 06/22/2013  . Hyperlipidemia    low HDL    Past Surgical History:  Procedure Laterality Date  . COLONOSCOPY  2007   Adenomatous polyp  . COLONOSCOPY  2014   neg  . GANGLION CYST EXCISION Left    left foot  . TONSILLECTOMY AND ADENOIDECTOMY  age 73  . TRIGGER FINGER RELEASE Left 08/15/2016    Social History   Social History  . Marital status: Married    Spouse name: N/A  . Number of children: N/A  . Years of education: N/A   Social History Main Topics  . Smoking status: Never Smoker  . Smokeless tobacco: Never Used  . Alcohol use No  . Drug use: No  . Sexual activity: Not on file   Other Topics Concern  . Not on file   Social History Narrative   Exercise:  Water aerobics twice a week    Family History  Problem Relation Age of Onset  . Allergies Mother   . Diabetes Mother   . Heart attack Father        > 1  . Diabetes Sister   . Alzheimer's disease Brother   . Stroke Neg Hx   . Cancer Neg Hx     Review of Systems  Constitutional: Positive for chills and fever (subjective).  HENT: Positive for rhinorrhea, sinus pressure (at times), sneezing and sore throat. Negative for congestion, ear pain and sinus pain.        Ears itch  Eyes: Negative for pain and visual disturbance.  Respiratory: Negative for cough, shortness of breath and wheezing.   Gastrointestinal: Positive for diarrhea and nausea. Negative for abdominal pain.  Genitourinary: Positive for dysuria (maybe starting). Negative for hematuria.  Neurological: Positive for headaches (mild at times).       Objective:   Vitals:   12/06/16 1444  BP: (!) 162/68  Pulse: 88  Resp: 16  Temp: 98.1 F (36.7 C)   Filed Weights   12/06/16 1444  Weight: 160 lb (72.6 kg)   Body mass index is 25.06  kg/m.  Wt Readings from Last 3 Encounters:  12/06/16 160 lb (72.6 kg)  11/07/16 153 lb (69.4 kg)  03/14/16 151 lb (68.5 kg)     Physical Exam GENERAL APPEARANCE: Appears stated age, well appearing, NAD EYES: conjunctiva clear, no icterus HEENT: bilateral tympanic membranes and ear canals normal, oropharynx with no erythema, no thyromegaly, trachea midline, no  cervical or supraclavicular lymphadenopathy LUNGS: Clear to auscultation without wheeze or crackles, unlabored breathing, good air entry bilaterally HEART: Normal S1,S2 without murmurs Abdomen: Soft, nontender, nondistended EXTREMITIES: Without clubbing, cyanosis, or edema        Assessment & Plan:   See Problem List for Assessment and Plan of chronic medical problems.

## 2016-12-06 NOTE — Assessment & Plan Note (Signed)
She thinks she is starting to get some mild dysuria No convincing symptoms, but we will check urinalysis and urine culture

## 2016-12-06 NOTE — Assessment & Plan Note (Signed)
Likely viral in nature She is convinced she needs an antibiotic, but advised her to start taking Flonase, continue Tylenol, ibuprofen and Mucinex along with plenty of rest and fluids for another couple of days-if symptoms worsen she can try an antibiotic, but I'm not convinced at this time that she needs it Prescription for amoxicillin given

## 2016-12-06 NOTE — Patient Instructions (Signed)
Give a urine sample.   We will call you with the results   Start using flonase.  Continue using mucinex, tylenol and ibuprofen.     A prescription for an antibiotic was given - use only if your symtpoms do not improve after the next 2 days.      Sinusitis, Adult Sinusitis is soreness and inflammation of your sinuses. Sinuses are hollow spaces in the bones around your face. Your sinuses are located:  Around your eyes.  In the middle of your forehead.  Behind your nose.  In your cheekbones.  Your sinuses and nasal passages are lined with a stringy fluid (mucus). Mucus normally drains out of your sinuses. When your nasal tissues become inflamed or swollen, the mucus can become trapped or blocked so air cannot flow through your sinuses. This allows bacteria, viruses, and funguses to grow, which leads to infection. Sinusitis can develop quickly and last for 7?10 days (acute) or for more than 12 weeks (chronic). Sinusitis often develops after a cold. What are the causes? This condition is caused by anything that creates swelling in the sinuses or stops mucus from draining, including:  Allergies.  Asthma.  Bacterial or viral infection.  Abnormally shaped bones between the nasal passages.  Nasal growths that contain mucus (nasal polyps).  Narrow sinus openings.  Pollutants, such as chemicals or irritants in the air.  A foreign object stuck in the nose.  A fungal infection. This is rare.  What increases the risk? The following factors may make you more likely to develop this condition:  Having allergies or asthma.  Having had a recent cold or respiratory tract infection.  Having structural deformities or blockages in your nose or sinuses.  Having a weak immune system.  Doing a lot of swimming or diving.  Overusing nasal sprays.  Smoking.  What are the signs or symptoms? The main symptoms of this condition are pain and a feeling of pressure around the  affected sinuses. Other symptoms include:  Upper toothache.  Earache.  Headache.  Bad breath.  Decreased sense of smell and taste.  A cough that may get worse at night.  Fatigue.  Fever.  Thick drainage from your nose. The drainage is often green and it may contain pus (purulent).  Stuffy nose or congestion.  Postnasal drip. This is when extra mucus collects in the throat or back of the nose.  Swelling and warmth over the affected sinuses.  Sore throat.  Sensitivity to light.  How is this diagnosed? This condition is diagnosed based on symptoms, a medical history, and a physical exam. To find out if your condition is acute or chronic, your health care provider may:  Look in your nose for signs of nasal polyps.  Tap over the affected sinus to check for signs of infection.  View the inside of your sinuses using an imaging device that has a light attached (endoscope).  If your health care provider suspects that you have chronic sinusitis, you may also:  Be tested for allergies.  Have a sample of mucus taken from your nose (nasal culture) and checked for bacteria.  Have a mucus sample examined to see if your sinusitis is related to an allergy.  If your sinusitis does not respond to treatment and it lasts longer than 8 weeks, you may have an MRI or CT scan to check your sinuses. These scans also help to determine how severe your infection is. In rare cases, a bone biopsy may be done to  rule out more serious types of fungal sinus disease. How is this treated? Treatment for sinusitis depends on the cause and whether your condition is chronic or acute. If a virus is causing your sinusitis, your symptoms will go away on their own within 10 days. You may be given medicines to relieve your symptoms, including:  Topical nasal decongestants. They shrink swollen nasal passages and let mucus drain from your sinuses.  Antihistamines. These drugs block inflammation that is  triggered by allergies. This can help to ease swelling in your nose and sinuses.  Topical nasal corticosteroids. These are nasal sprays that ease inflammation and swelling in your nose and sinuses.  Nasal saline washes. These rinses can help to get rid of thick mucus in your nose.  If your condition is caused by bacteria, you will be given an antibiotic medicine. If your condition is caused by a fungus, you will be given an antifungal medicine. Surgery may be needed to correct underlying conditions, such as narrow nasal passages. Surgery may also be needed to remove polyps. Follow these instructions at home: Medicines  Take, use, or apply over-the-counter and prescription medicines only as told by your health care provider. These may include nasal sprays.  If you were prescribed an antibiotic medicine, take it as told by your health care provider. Do not stop taking the antibiotic even if you start to feel better. Hydrate and Humidify  Drink enough water to keep your urine clear or pale yellow. Staying hydrated will help to thin your mucus.  Use a cool mist humidifier to keep the humidity level in your home above 50%.  Inhale steam for 10-15 minutes, 3-4 times a day or as told by your health care provider. You can do this in the bathroom while a hot shower is running.  Limit your exposure to cool or dry air. Rest  Rest as much as possible.  Sleep with your head raised (elevated).  Make sure to get enough sleep each night. General instructions  Apply a warm, moist washcloth to your face 3-4 times a day or as told by your health care provider. This will help with discomfort.  Wash your hands often with soap and water to reduce your exposure to viruses and other germs. If soap and water are not available, use hand sanitizer.  Do not smoke. Avoid being around people who are smoking (secondhand smoke).  Keep all follow-up visits as told by your health care provider. This is  important. Contact a health care provider if:  You have a fever.  Your symptoms get worse.  Your symptoms do not improve within 10 days. Get help right away if:  You have a severe headache.  You have persistent vomiting.  You have pain or swelling around your face or eyes.  You have vision problems.  You develop confusion.  Your neck is stiff.  You have trouble breathing. This information is not intended to replace advice given to you by your health care provider. Make sure you discuss any questions you have with your health care provider. Document Released: 05/27/2005 Document Revised: 01/21/2016 Document Reviewed: 03/22/2015 Elsevier Interactive Patient Education  2017 [employer].

## 2016-12-06 NOTE — Assessment & Plan Note (Signed)
Has seen the eye doctor Continue warm compresses and over-the-counter stye medication Follow-up with eye doctor if no continued improvement

## 2016-12-07 LAB — URINE CULTURE

## 2016-12-08 ENCOUNTER — Encounter: Payer: Self-pay | Admitting: Internal Medicine

## 2017-01-02 DIAGNOSIS — M79645 Pain in left finger(s): Secondary | ICD-10-CM | POA: Diagnosis not present

## 2017-01-02 DIAGNOSIS — Z4789 Encounter for other orthopedic aftercare: Secondary | ICD-10-CM | POA: Diagnosis not present

## 2017-01-30 DIAGNOSIS — M65312 Trigger thumb, left thumb: Secondary | ICD-10-CM | POA: Diagnosis not present

## 2017-01-30 DIAGNOSIS — M65332 Trigger finger, left middle finger: Secondary | ICD-10-CM | POA: Diagnosis not present

## 2017-01-30 DIAGNOSIS — M79645 Pain in left finger(s): Secondary | ICD-10-CM | POA: Diagnosis not present

## 2017-03-11 ENCOUNTER — Ambulatory Visit (INDEPENDENT_AMBULATORY_CARE_PROVIDER_SITE_OTHER): Payer: PPO

## 2017-03-11 DIAGNOSIS — Z23 Encounter for immunization: Secondary | ICD-10-CM

## 2017-03-17 DIAGNOSIS — M6530 Trigger finger, unspecified finger: Secondary | ICD-10-CM | POA: Diagnosis not present

## 2017-03-17 DIAGNOSIS — M79642 Pain in left hand: Secondary | ICD-10-CM | POA: Diagnosis not present

## 2017-03-17 DIAGNOSIS — M65312 Trigger thumb, left thumb: Secondary | ICD-10-CM | POA: Diagnosis not present

## 2017-03-31 DIAGNOSIS — M65312 Trigger thumb, left thumb: Secondary | ICD-10-CM | POA: Diagnosis not present

## 2017-04-14 DIAGNOSIS — M65312 Trigger thumb, left thumb: Secondary | ICD-10-CM | POA: Diagnosis not present

## 2017-04-24 DIAGNOSIS — E119 Type 2 diabetes mellitus without complications: Secondary | ICD-10-CM | POA: Diagnosis not present

## 2017-04-24 LAB — HM DIABETES EYE EXAM

## 2017-05-28 DIAGNOSIS — Z6824 Body mass index (BMI) 24.0-24.9, adult: Secondary | ICD-10-CM | POA: Diagnosis not present

## 2017-05-28 DIAGNOSIS — M8588 Other specified disorders of bone density and structure, other site: Secondary | ICD-10-CM | POA: Diagnosis not present

## 2017-05-28 DIAGNOSIS — Z1231 Encounter for screening mammogram for malignant neoplasm of breast: Secondary | ICD-10-CM | POA: Diagnosis not present

## 2017-05-28 DIAGNOSIS — Z01419 Encounter for gynecological examination (general) (routine) without abnormal findings: Secondary | ICD-10-CM | POA: Diagnosis not present

## 2017-05-28 DIAGNOSIS — N958 Other specified menopausal and perimenopausal disorders: Secondary | ICD-10-CM | POA: Diagnosis not present

## 2017-05-28 DIAGNOSIS — Z124 Encounter for screening for malignant neoplasm of cervix: Secondary | ICD-10-CM | POA: Diagnosis not present

## 2017-05-28 LAB — HM MAMMOGRAPHY: HM MAMMO: NORMAL (ref 0–4)

## 2017-08-20 ENCOUNTER — Encounter: Payer: Self-pay | Admitting: Internal Medicine

## 2017-08-25 ENCOUNTER — Encounter: Payer: Self-pay | Admitting: Internal Medicine

## 2017-08-25 ENCOUNTER — Ambulatory Visit (INDEPENDENT_AMBULATORY_CARE_PROVIDER_SITE_OTHER): Payer: PPO | Admitting: Internal Medicine

## 2017-08-25 VITALS — BP 130/70 | HR 79 | Temp 98.6°F | Resp 16 | Wt 159.0 lb

## 2017-08-25 DIAGNOSIS — J0100 Acute maxillary sinusitis, unspecified: Secondary | ICD-10-CM

## 2017-08-25 MED ORDER — AMOXICILLIN 500 MG PO CAPS: 500.0000 mg | ORAL_CAPSULE | Freq: Three times a day (TID) | ORAL | 0 refills | Status: DC

## 2017-08-25 NOTE — Assessment & Plan Note (Signed)
Likely bacterial  Start amoxicillin otc cold medications Rest, fluid Call if no improvement  

## 2017-08-25 NOTE — Patient Instructions (Signed)
Take the antibiotic as prescribed.  Continue the over the counter cold medications.    Call if no improvement    Sinusitis, Adult Sinusitis is soreness and inflammation of your sinuses. Sinuses are hollow spaces in the bones around your face. Your sinuses are located:  Around your eyes.  In the middle of your forehead.  Behind your nose.  In your cheekbones.  Your sinuses and nasal passages are lined with a stringy fluid (mucus). Mucus normally drains out of your sinuses. When your nasal tissues become inflamed or swollen, the mucus can become trapped or blocked so air cannot flow through your sinuses. This allows bacteria, viruses, and funguses to grow, which leads to infection. Sinusitis can develop quickly and last for 7?10 days (acute) or for more than 12 weeks (chronic). Sinusitis often develops after a cold. What are the causes? This condition is caused by anything that creates swelling in the sinuses or stops mucus from draining, including:  Allergies.  Asthma.  Bacterial or viral infection.  Abnormally shaped bones between the nasal passages.  Nasal growths that contain mucus (nasal polyps).  Narrow sinus openings.  Pollutants, such as chemicals or irritants in the air.  A foreign object stuck in the nose.  A fungal infection. This is rare.  What increases the risk? The following factors may make you more likely to develop this condition:  Having allergies or asthma.  Having had a recent cold or respiratory tract infection.  Having structural deformities or blockages in your nose or sinuses.  Having a weak immune system.  Doing a lot of swimming or diving.  Overusing nasal sprays.  Smoking.  What are the signs or symptoms? The main symptoms of this condition are pain and a feeling of pressure around the affected sinuses. Other symptoms include:  Upper toothache.  Earache.  Headache.  Bad breath.  Decreased sense of smell and taste.  A  cough that may get worse at night.  Fatigue.  Fever.  Thick drainage from your nose. The drainage is often green and it may contain pus (purulent).  Stuffy nose or congestion.  Postnasal drip. This is when extra mucus collects in the throat or back of the nose.  Swelling and warmth over the affected sinuses.  Sore throat.  Sensitivity to light.  How is this diagnosed? This condition is diagnosed based on symptoms, a medical history, and a physical exam. To find out if your condition is acute or chronic, your health care provider may:  Look in your nose for signs of nasal polyps.  Tap over the affected sinus to check for signs of infection.  View the inside of your sinuses using an imaging device that has a light attached (endoscope).  If your health care provider suspects that you have chronic sinusitis, you may also:  Be tested for allergies.  Have a sample of mucus taken from your nose (nasal culture) and checked for bacteria.  Have a mucus sample examined to see if your sinusitis is related to an allergy.  If your sinusitis does not respond to treatment and it lasts longer than 8 weeks, you may have an MRI or CT scan to check your sinuses. These scans also help to determine how severe your infection is. In rare cases, a bone biopsy may be done to rule out more serious types of fungal sinus disease. How is this treated? Treatment for sinusitis depends on the cause and whether your condition is chronic or acute. If a virus is  causing your sinusitis, your symptoms will go away on their own within 10 days. You may be given medicines to relieve your symptoms, including:  Topical nasal decongestants. They shrink swollen nasal passages and let mucus drain from your sinuses.  Antihistamines. These drugs block inflammation that is triggered by allergies. This can help to ease swelling in your nose and sinuses.  Topical nasal corticosteroids. These are nasal sprays that ease  inflammation and swelling in your nose and sinuses.  Nasal saline washes. These rinses can help to get rid of thick mucus in your nose.  If your condition is caused by bacteria, you will be given an antibiotic medicine. If your condition is caused by a fungus, you will be given an antifungal medicine. Surgery may be needed to correct underlying conditions, such as narrow nasal passages. Surgery may also be needed to remove polyps. Follow these instructions at home: Medicines  Take, use, or apply over-the-counter and prescription medicines only as told by your health care provider. These may include nasal sprays.  If you were prescribed an antibiotic medicine, take it as told by your health care provider. Do not stop taking the antibiotic even if you start to feel better. Hydrate and Humidify  Drink enough water to keep your urine clear or pale yellow. Staying hydrated will help to thin your mucus.  Use a cool mist humidifier to keep the humidity level in your home above 50%.  Inhale steam for 10-15 minutes, 3-4 times a day or as told by your health care provider. You can do this in the bathroom while a hot shower is running.  Limit your exposure to cool or dry air. Rest  Rest as much as possible.  Sleep with your head raised (elevated).  Make sure to get enough sleep each night. General instructions  Apply a warm, moist washcloth to your face 3-4 times a day or as told by your health care provider. This will help with discomfort.  Wash your hands often with soap and water to reduce your exposure to viruses and other germs. If soap and water are not available, use hand sanitizer.  Do not smoke. Avoid being around people who are smoking (secondhand smoke).  Keep all follow-up visits as told by your health care provider. This is important. Contact a health care provider if:  You have a fever.  Your symptoms get worse.  Your symptoms do not improve within 10 days. Get help  right away if:  You have a severe headache.  You have persistent vomiting.  You have pain or swelling around your face or eyes.  You have vision problems.  You develop confusion.  Your neck is stiff.  You have trouble breathing. This information is not intended to replace advice given to you by your health care provider. Make sure you discuss any questions you have with your health care provider. Document Released: 05/27/2005 Document Revised: 01/21/2016 Document Reviewed: 03/22/2015 Elsevier Interactive Patient Education  [employer].

## 2017-08-25 NOTE — Progress Notes (Signed)
Subjective:    Patient ID: Lisa Mcgee, female    DOB: 1946-03-15, 72 y.o.   MRN: [id number]  HPI She is here for an acute visit for cold symptoms.  Her symptoms started one week ago.  She is experiencing fever, chills, nasal congestion, ear pain and popping, postnasal drip, sinus pain, sneezing, sore throat, dry cough, body aches and headaches.  She denies any shortness of breath, wheezing, nausea, diarrhea lightheadedness.  She has taken tylenol, mucinex, vitamin c.  Medications and allergies reviewed with patient and updated if appropriate.  Patient Active Problem List   Diagnosis Date Noted  . Dysuria 12/06/2016  . Acute non-recurrent maxillary sinusitis 12/06/2016  . Hordeolum externum of right upper eyelid 12/06/2016  . Allergic rhinitis 03/14/2016  . Pes cavus 04/11/2014  . Vitamin D deficiency 08/04/2013  . Osteopenia 04/17/2013  . Rhinitis, non-allergic 05/26/2011  . SLEEP DISORDER 08/27/2010  . SYNCOPE 06/28/2008  . Palpitations 06/28/2008  . Hyperlipidemia 04/20/2008  . History of colonic polyps 04/20/2008  . Diabetes type 2, controlled ([place]) 05/28/2007    Current Outpatient Medications on File Prior to Visit  Medication Sig Dispense Refill  . Calcium Carbonate-Vitamin D (CALCIUM 600 + D PO) Take 1 tablet by mouth 2 (two) times daily.     . cholecalciferol (VITAMIN D) 1000 UNITS tablet Take 1,000 Units by mouth daily.    . Cholecalciferol (VITAMIN D3) 2000 UNITS TABS Take 1 tablet by mouth 2 (two) times daily.     . clobetasol cream (TEMOVATE) 6.96 % Apply 1 application topically as needed.    . Coenzyme Q10 (COQ10 PO) Take 300 mg by mouth daily.      . fish oil-omega-3 fatty acids 1000 MG capsule Take 2 g by mouth daily.      . Flaxseed, Linseed, (FLAX SEED OIL) 1000 MG CAPS Take 1 capsule by mouth 2 (two) times daily.     . fluticasone (FLONASE) 50 MCG/ACT nasal spray PLACE 2 SPRAYS IN EACH NOSTRIL DAILY    AS NEEDED 16 g 0  . Garlic 789 MG TABS Take  1,000 mg by mouth 2 (two) times daily.     Marland Kitchen glucose blood test strip 1 each by Other route 2 (two) times daily. Use to check blood sugars twice a day. Dx E11.9 100 each 3  . ibuprofen (ADVIL,MOTRIN) 200 MG tablet Take 200 mg by mouth. Take 3 pills prn    . ketoconazole (NIZORAL) 2 % cream Apply 1 application topically 2 (two) times daily as needed for irritation. 15 g 0  . Lancet Device MISC Test blood sugar before each meal and at bedtime. 1 each 11  . Lancets (FREESTYLE) lancets 1 each by Other route 2 (two) times daily. Use to check blood sugars twice a day Dx E11.9 60 each 11  . Multiple Vitamins-Minerals (HAIR/SKIN/NAILS PO) Take 1 tablet by mouth daily.     . mupirocin ointment (BACTROBAN) 2 % Apply 1 application topically as needed.    . NON FORMULARY [employer] MVI-Take 1 pill bid    . pyridOXINE (VITAMIN B-6) 100 MG tablet Take 100 mg by mouth 2 (two) times daily.     Marland Kitchen triamcinolone cream (KENALOG) 0.1 % Apply 1 application topically as needed.    . vitamin B-12 (CYANOCOBALAMIN) 1000 MCG tablet Take 1,000 mcg by mouth daily.       No current facility-administered medications on file prior to visit.     Past Medical History:  Diagnosis Date  .  Diabetes mellitus without complication (HCC)    diet controlled  . Environmental allergies   . Ganglion cyst of left foot 06/22/2013  . Hyperlipidemia    low HDL    Past Surgical History:  Procedure Laterality Date  . COLONOSCOPY  2007   Adenomatous polyp  . COLONOSCOPY  2014   neg  . GANGLION CYST EXCISION Left    left foot  . TONSILLECTOMY AND ADENOIDECTOMY  age 77  . TRIGGER FINGER RELEASE Left 08/15/2016    Social History   Socioeconomic History  . Marital status: Married    Spouse name: None  . Number of children: None  . Years of education: None  . Highest education level: None  Social Needs  . Financial resource strain: None  . Food insecurity - worry: None  . Food insecurity - inability: None  .  Transportation needs - medical: None  . Transportation needs - non-medical: None  Occupational History  . None  Tobacco Use  . Smoking status: Never Smoker  . Smokeless tobacco: Never Used  Substance and Sexual Activity  . Alcohol use: No  . Drug use: No  . Sexual activity: None  Other Topics Concern  . None  Social History Narrative   Exercise:  Water aerobics twice a week    Family History  Problem Relation Age of Onset  . Allergies Mother   . Diabetes Mother   . Heart attack Father        > 62  . Diabetes Sister   . Alzheimer's disease Brother   . Stroke Neg Hx   . Cancer Neg Hx     Review of Systems  Constitutional: Positive for chills and fever.  HENT: Positive for congestion, ear pain (ear popping, pressure), postnasal drip, sinus pain, sneezing and sore throat.        Teeth pain  Respiratory: Positive for cough (dry). Negative for shortness of breath and wheezing.   Gastrointestinal: Negative for diarrhea and nausea.  Musculoskeletal: Positive for myalgias and neck pain (heavy feling).  Neurological: Positive for headaches. Negative for dizziness and light-headedness.       Objective:   Vitals:   08/25/17 1541  BP: 130/70  Pulse: 79  Resp: 16  Temp: 98.6 F (37 C)  SpO2: 98%   Filed Weights   08/25/17 1541  Weight: 159 lb (72.1 kg)   Body mass index is 24.9 kg/m.  Wt Readings from Last 3 Encounters:  08/25/17 159 lb (72.1 kg)  12/06/16 160 lb (72.6 kg)  11/07/16 153 lb (69.4 kg)     Physical Exam GENERAL APPEARANCE: Appears stated age, well appearing, NAD EYES: conjunctiva clear, no icterus HEENT: bilateral tympanic membranes and ear canals normal, sinus tenderness with palpation, oropharynx with mild erythema, no thyromegaly, trachea midline, no cervical or supraclavicular lymphadenopathy LUNGS: Clear to auscultation without wheeze or crackles, unlabored breathing, good air entry bilaterally CARDIOVASCULAR: Normal S1,S2 without murmurs, no  edema SKIN: warm, dry      Assessment & Plan:   See Problem List for Assessment and Plan of chronic medical problems.

## 2017-08-28 ENCOUNTER — Encounter: Payer: Self-pay | Admitting: Internal Medicine

## 2017-11-09 NOTE — Patient Instructions (Addendum)
Test(s) ordered today. Your results will be released to [facility] (or called to you) after review, usually within 72hours after test completion. If any changes need to be made, you will be notified at that same time.  All other Health Maintenance issues reviewed.   All recommended immunizations and age-appropriate screenings are up-to-date or discussed.  No immunizations administered today.  Look into getting the shingles vaccine (shingrix).    Medications reviewed and updated.  No changes recommended at this time.   Please followup in 6 months   Health Maintenance, Female Adopting a healthy lifestyle and getting preventive care can go a long way to promote health and wellness. Talk with your health care provider about what schedule of regular examinations is right for you. This is a good chance for you to check in with your provider about disease prevention and staying healthy. In between checkups, there are plenty of things you can do on your own. Experts have done a lot of research about which lifestyle changes and preventive measures are most likely to keep you healthy. Ask your health care provider for more information. Weight and diet Eat a healthy diet  Be sure to include plenty of vegetables, fruits, low-fat dairy products, and lean protein.  Do not eat a lot of foods high in solid fats, added sugars, or salt.  Get regular exercise. This is one of the most important things you can do for your health. ? Most adults should exercise for at least 150 minutes each week. The exercise should increase your heart rate and make you sweat (moderate-intensity exercise). ? Most adults should also do strengthening exercises at least twice a week. This is in addition to the moderate-intensity exercise.  Maintain a healthy weight  Body mass index (BMI) is a measurement that can be used to identify possible weight problems. It estimates body fat based on height and weight. Your health care  provider can help determine your BMI and help you achieve or maintain a healthy weight.  For females 79 years of age and older: ? A BMI below 18.5 is considered underweight. ? A BMI of 18.5 to 24.9 is normal. ? A BMI of 25 to 29.9 is considered overweight. ? A BMI of 30 and above is considered obese.  Watch levels of cholesterol and blood lipids  You should start having your blood tested for lipids and cholesterol at 70 years of age, then have this test every 5 years.  You may need to have your cholesterol levels checked more often if: ? Your lipid or cholesterol levels are high. ? You are older than 70 years of age. ? You are at high risk for heart disease.  Cancer screening Lung Cancer  Lung cancer screening is recommended for adults 49-66 years old who are at high risk for lung cancer because of a history of smoking.  A yearly low-dose CT scan of the lungs is recommended for people who: ? Currently smoke. ? Have quit within the past 15 years. ? Have at least a 30-pack-year history of smoking. A pack year is smoking an average of one pack of cigarettes a day for 1 year.  Yearly screening should continue until it has been 15 years since you quit.  Yearly screening should stop if you develop a health problem that would prevent you from having lung cancer treatment.  Breast Cancer  Practice breast self-awareness. This means understanding how your breasts normally appear and feel.  It also means doing regular breast self-exams.  Let your health care provider know about any changes, no matter how small.  If you are in your 20s or 30s, you should have a clinical breast exam (CBE) by a health care provider every 1-3 years as part of a regular health exam.  If you are 73 or older, have a CBE every year. Also consider having a breast X-ray (mammogram) every year.  If you have a family history of breast cancer, talk to your health care provider about genetic screening.  If you are  at high risk for breast cancer, talk to your health care provider about having an MRI and a mammogram every year.  Breast cancer gene (BRCA) assessment is recommended for women who have family members with BRCA-related cancers. BRCA-related cancers include: ? Breast. ? Ovarian. ? Tubal. ? Peritoneal cancers.  Results of the assessment will determine the need for genetic counseling and BRCA1 and BRCA2 testing.  Cervical Cancer Your health care provider may recommend that you be screened regularly for cancer of the pelvic organs (ovaries, uterus, and vagina). This screening involves a pelvic examination, including checking for microscopic changes to the surface of your cervix (Pap test). You may be encouraged to have this screening done every 3 years, beginning at age 27.  For women ages 85-65, health care providers may recommend pelvic exams and Pap testing every 3 years, or they may recommend the Pap and pelvic exam, combined with testing for human papilloma virus (HPV), every 5 years. Some types of HPV increase your risk of cervical cancer. Testing for HPV may also be done on women of any age with unclear Pap test results.  Other health care providers may not recommend any screening for nonpregnant women who are considered low risk for pelvic cancer and who do not have symptoms. Ask your health care provider if a screening pelvic exam is right for you.  If you have had past treatment for cervical cancer or a condition that could lead to cancer, you need Pap tests and screening for cancer for at least 20 years after your treatment. If Pap tests have been discontinued, your risk factors (such as having a new sexual partner) need to be reassessed to determine if screening should resume. Some women have medical problems that increase the chance of getting cervical cancer. In these cases, your health care provider may recommend more frequent screening and Pap tests.  Colorectal Cancer  This type of  cancer can be detected and often prevented.  Routine colorectal cancer screening usually begins at 70 years of age and continues through 70 years of age.  Your health care provider may recommend screening at an earlier age if you have risk factors for colon cancer.  Your health care provider may also recommend using home test kits to check for hidden blood in the stool.  A small camera at the end of a tube can be used to examine your colon directly (sigmoidoscopy or colonoscopy). This is done to check for the earliest forms of colorectal cancer.  Routine screening usually begins at age 43.  Direct examination of the colon should be repeated every 5-10 years through 70 years of age. However, you may need to be screened more often if early forms of precancerous polyps or small growths are found.  Skin Cancer  Check your skin from head to toe regularly.  Tell your health care provider about any new moles or changes in moles, especially if there is a change in a mole's shape or color.  Also tell your health care provider if you have a mole that is larger than the size of a pencil eraser.  Always use sunscreen. Apply sunscreen liberally and repeatedly throughout the day.  Protect yourself by wearing long sleeves, pants, a wide-brimmed hat, and sunglasses whenever you are outside.  Heart disease, diabetes, and high blood pressure  High blood pressure causes heart disease and increases the risk of stroke. High blood pressure is more likely to develop in: ? People who have blood pressure in the high end of the normal range (130-139/85-89 mm Hg). ? People who are overweight or obese. ? People who are African American.  If you are 32-42 years of age, have your blood pressure checked every 3-5 years. If you are 20 years of age or older, have your blood pressure checked every year. You should have your blood pressure measured twice-once when you are at a hospital or clinic, and once when you are  not at a hospital or clinic. Record the average of the two measurements. To check your blood pressure when you are not at a hospital or clinic, you can use: ? An automated blood pressure machine at a pharmacy. ? A home blood pressure monitor.  If you are between 5 years and 26 years old, ask your health care provider if you should take aspirin to prevent strokes.  Have regular diabetes screenings. This involves taking a blood sample to check your fasting blood sugar level. ? If you are at a normal weight and have a low risk for diabetes, have this test once every three years after 69 years of age. ? If you are overweight and have a high risk for diabetes, consider being tested at a younger age or more often. Preventing infection Hepatitis B  If you have a higher risk for hepatitis B, you should be screened for this virus. You are considered at high risk for hepatitis B if: ? You were born in a country where hepatitis B is common. Ask your health care provider which countries are considered high risk. ? Your parents were born in a high-risk country, and you have not been immunized against hepatitis B (hepatitis B vaccine). ? You have HIV or AIDS. ? You use needles to inject street drugs. ? You live with someone who has hepatitis B. ? You have had sex with someone who has hepatitis B. ? You get hemodialysis treatment. ? You take certain medicines for conditions, including cancer, organ transplantation, and autoimmune conditions.  Hepatitis C  Blood testing is recommended for: ? Everyone born from [birth year] through 1963. ? Anyone with known risk factors for hepatitis C.  Sexually transmitted infections (STIs)  You should be screened for sexually transmitted infections (STIs) including gonorrhea and chlamydia if: ? You are sexually active and are younger than 70 years of age. ? You are older than 70 years of age and your health care provider tells you that you are at risk for this type of  infection. ? Your sexual activity has changed since you were last screened and you are at an increased risk for chlamydia or gonorrhea. Ask your health care provider if you are at risk.  If you do not have HIV, but are at risk, it may be recommended that you take a prescription medicine daily to prevent HIV infection. This is called pre-exposure prophylaxis (PrEP). You are considered at risk if: ? You are sexually active and do not regularly use condoms or know the HIV status of  your partner(s). ? You take drugs by injection. ? You are sexually active with a partner who has HIV.  Talk with your health care provider about whether you are at high risk of being infected with HIV. If you choose to begin PrEP, you should first be tested for HIV. You should then be tested every 3 months for as long as you are taking PrEP. Pregnancy  If you are premenopausal and you may become pregnant, ask your health care provider about preconception counseling.  If you may become pregnant, take 400 to 800 micrograms (mcg) of folic acid every day.  If you want to prevent pregnancy, talk to your health care provider about birth control (contraception). Osteoporosis and menopause  Osteoporosis is a disease in which the bones lose minerals and strength with aging. This can result in serious bone fractures. Your risk for osteoporosis can be identified using a bone density scan.  If you are 59 years of age or older, or if you are at risk for osteoporosis and fractures, ask your health care provider if you should be screened.  Ask your health care provider whether you should take a calcium or vitamin D supplement to lower your risk for osteoporosis.  Menopause may have certain physical symptoms and risks.  Hormone replacement therapy may reduce some of these symptoms and risks. Talk to your health care provider about whether hormone replacement therapy is right for you. Follow these instructions at home:  Schedule  regular health, dental, and eye exams.  Stay current with your immunizations.  Do not use any tobacco products including cigarettes, chewing tobacco, or electronic cigarettes.  If you are pregnant, do not drink alcohol.  If you are breastfeeding, limit how much and how often you drink alcohol.  Limit alcohol intake to no more than 1 drink per day for nonpregnant women. One drink equals 12 ounces of beer, 5 ounces of wine, or 1 ounces of hard liquor.  Do not use street drugs.  Do not share needles.  Ask your health care provider for help if you need support or information about quitting drugs.  Tell your health care provider if you often feel depressed.  Tell your health care provider if you have ever been abused or do not feel safe at home. This information is not intended to replace advice given to you by your health care provider. Make sure you discuss any questions you have with your health care provider. Document Released: 12/10/2010 Document Revised: 11/02/2015 Document Reviewed: 02/28/2015 Elsevier Interactive Patient Education  [employer].

## 2017-11-09 NOTE — Progress Notes (Signed)
Subjective:    Patient ID: Lisa Mcgee, female    DOB: 03-30-1946, 72 y.o.   MRN: [id number]  HPI She is here for a physical exam.   She is doing to the pool 4 times a week.   She feels good and denies any problems.    No changes in health since she was here last.   Medications and allergies reviewed with patient and updated if appropriate.  Patient Active Problem List   Diagnosis Date Noted  . Elevated blood pressure reading 11/11/2017  . Allergic rhinitis 03/14/2016  . Pes cavus 04/11/2014  . Vitamin D deficiency 08/04/2013  . Osteopenia 04/17/2013  . Rhinitis, non-allergic 05/26/2011  . SLEEP DISORDER 08/27/2010  . SYNCOPE 06/28/2008  . Palpitations 06/28/2008  . Hyperlipidemia 04/20/2008  . History of colonic polyps 04/20/2008  . Diabetes type 2, controlled ([place]) 05/28/2007    Current Outpatient Medications on File Prior to Visit  Medication Sig Dispense Refill  . Calcium Carbonate-Vitamin D (CALCIUM 600 + D PO) Take 1 tablet by mouth 2 (two) times daily.     . cholecalciferol (VITAMIN D) 1000 UNITS tablet Take 1,000 Units by mouth daily.    . Cholecalciferol (VITAMIN D3) 2000 UNITS TABS Take 1 tablet by mouth 2 (two) times daily.     . clobetasol cream (TEMOVATE) 5.17 % Apply 1 application topically as needed.    . Coenzyme Q10 (COQ10 PO) Take 300 mg by mouth daily.      . fish oil-omega-3 fatty acids 1000 MG capsule Take 2 g by mouth daily.      . Flaxseed, Linseed, (FLAX SEED OIL) 1000 MG CAPS Take 1 capsule by mouth 2 (two) times daily.     . fluticasone (FLONASE) 50 MCG/ACT nasal spray PLACE 2 SPRAYS IN EACH NOSTRIL DAILY    AS NEEDED 16 g 0  . Garlic 616 MG TABS Take 1,000 mg by mouth 2 (two) times daily.     Marland Kitchen glucose blood test strip 1 each by Other route 2 (two) times daily. Use to check blood sugars twice a day. Dx E11.9 100 each 3  . ibuprofen (ADVIL,MOTRIN) 200 MG tablet Take 200 mg by mouth. Take 3 pills prn    . ketoconazole (NIZORAL) 2 % cream Apply  1 application topically 2 (two) times daily as needed for irritation. 15 g 0  . Lancet Device MISC Test blood sugar before each meal and at bedtime. 1 each 11  . Lancets (FREESTYLE) lancets 1 each by Other route 2 (two) times daily. Use to check blood sugars twice a day Dx E11.9 60 each 11  . Multiple Vitamins-Minerals (HAIR/SKIN/NAILS PO) Take 1 tablet by mouth daily.     . mupirocin ointment (BACTROBAN) 2 % Apply 1 application topically as needed.    . NON FORMULARY [employer] MVI-Take 1 pill bid    . pyridOXINE (VITAMIN B-6) 100 MG tablet Take 100 mg by mouth 2 (two) times daily.     Marland Kitchen triamcinolone cream (KENALOG) 0.1 % Apply 1 application topically as needed.    . vitamin B-12 (CYANOCOBALAMIN) 1000 MCG tablet Take 1,000 mcg by mouth daily.       No current facility-administered medications on file prior to visit.     Past Medical History:  Diagnosis Date  . Diabetes mellitus without complication (HCC)    diet controlled  . Environmental allergies   . Ganglion cyst of left foot 06/22/2013  . Hyperlipidemia    low HDL  Past Surgical History:  Procedure Laterality Date  . COLONOSCOPY  2007   Adenomatous polyp  . COLONOSCOPY  2014   neg  . GANGLION CYST EXCISION Left    left foot  . TONSILLECTOMY AND ADENOIDECTOMY  age 57  . TRIGGER FINGER RELEASE Left 08/15/2016    Social History   Socioeconomic History  . Marital status: Married    Spouse name: Not on file  . Number of children: Not on file  . Years of education: Not on file  . Highest education level: Not on file  Occupational History  . Not on file  Social Needs  . Financial resource strain: Not on file  . Food insecurity:    Worry: Not on file    Inability: Not on file  . Transportation needs:    Medical: Not on file    Non-medical: Not on file  Tobacco Use  . Smoking status: Never Smoker  . Smokeless tobacco: Never Used  Substance and Sexual Activity  . Alcohol use: No  . Drug use: No  . Sexual  activity: Not on file  Lifestyle  . Physical activity:    Days per week: Not on file    Minutes per session: Not on file  . Stress: Not on file  Relationships  . Social connections:    Talks on phone: Not on file    Gets together: Not on file    Attends religious service: Not on file    Active member of club or organization: Not on file    Attends meetings of clubs or organizations: Not on file    Relationship status: Not on file  Other Topics Concern  . Not on file  Social History Narrative   Exercise:  Water aerobics twice a week    Family History  Problem Relation Age of Onset  . Allergies Mother   . Diabetes Mother   . Heart attack Father        > 40  . Diabetes Sister   . Alzheimer's disease Brother   . Colon cancer Maternal Grandfather   . Stroke Neg Hx   . Cancer Neg Hx     Review of Systems  Constitutional: Negative for chills and fever.  Eyes: Negative for visual disturbance.  Respiratory: Negative for cough, shortness of breath and wheezing.   Cardiovascular: Negative for chest pain, palpitations and leg swelling.  Gastrointestinal: Negative for abdominal pain, blood in stool, constipation, diarrhea and nausea.  Genitourinary: Negative for dysuria and hematuria.  Musculoskeletal: Positive for arthralgias (mild shoulder at times). Negative for back pain.  Skin: Negative for color change and rash.  Neurological: Negative for dizziness, light-headedness and headaches.  Psychiatric/Behavioral: Negative for dysphoric mood. The patient is not nervous/anxious.        Objective:   Vitals:   11/11/17 0803  BP: (!) 144/84  Pulse: 75  Resp: 16  Temp: 98 F (36.7 C)  SpO2: 97%   Filed Weights   11/11/17 0803  Weight: 151 lb (68.5 kg)   Body mass index is 23.65 kg/m.  Wt Readings from Last 3 Encounters:  11/11/17 151 lb (68.5 kg)  08/25/17 159 lb (72.1 kg)  12/06/16 160 lb (72.6 kg)     Physical Exam Constitutional: She appears well-developed and  well-nourished. No distress.  HENT:  Head: Normocephalic and atraumatic.  Right Ear: External ear normal. Normal ear canal and TM Left Ear: External ear normal.  Normal ear canal and TM Mouth/Throat: Oropharynx is clear and  moist.  Eyes: Conjunctivae and EOM are normal.  Neck: Neck supple. No tracheal deviation present. No thyromegaly present.  No carotid bruit  Cardiovascular: Normal rate, regular rhythm and normal heart sounds.   No murmur heard.  No edema. Pulmonary/Chest: Effort normal and breath sounds normal. No respiratory distress. She has no wheezes. She has no rales.  Breast: deferred to Gyn Abdominal: Soft. She exhibits no distension. There is no tenderness.  Lymphadenopathy: She has no cervical adenopathy.  Skin: Skin is warm and dry. She is not diaphoretic.  Psychiatric: She has a normal mood and affect. Her behavior is normal.     Diabetic Foot Exam - Simple   Simple Foot Form Diabetic Foot exam was performed with the following findings:  Yes 11/11/2017  8:26 AM  Visual Inspection No deformities, no ulcerations, no other skin breakdown bilaterally:  Yes Sensation Testing Intact to touch and monofilament testing bilaterally:  Yes Pulse Check Posterior Tibialis and Dorsalis pulse intact bilaterally:  Yes Comments         Assessment & Plan:   Physical exam: Screening blood work  ordered Immunizations   Discussed shingrix, others up to date Colonoscopy   Up to date - MGF had colon cancer - ? Due in 5 years which would be this year Mammogram   Up to date  Gyn   Up to date  Dexa    Up to date  Eye exams   Up to date  EKG   Done 10/2015 Exercise  Regular  -- pool exercises 4/ Weight    normal BMI Skin   No concerns Substance abuse   none  See Problem List for Assessment and Plan of chronic medical problems.   FU in 1 year

## 2017-11-11 ENCOUNTER — Ambulatory Visit (INDEPENDENT_AMBULATORY_CARE_PROVIDER_SITE_OTHER): Payer: PPO | Admitting: Internal Medicine

## 2017-11-11 ENCOUNTER — Encounter: Payer: Self-pay | Admitting: Internal Medicine

## 2017-11-11 ENCOUNTER — Other Ambulatory Visit (INDEPENDENT_AMBULATORY_CARE_PROVIDER_SITE_OTHER): Payer: PPO

## 2017-11-11 VITALS — BP 144/84 | HR 75 | Temp 98.0°F | Resp 16 | Ht 67.0 in | Wt 151.0 lb

## 2017-11-11 DIAGNOSIS — Z1211 Encounter for screening for malignant neoplasm of colon: Secondary | ICD-10-CM | POA: Diagnosis not present

## 2017-11-11 DIAGNOSIS — R030 Elevated blood-pressure reading, without diagnosis of hypertension: Secondary | ICD-10-CM

## 2017-11-11 DIAGNOSIS — Z0000 Encounter for general adult medical examination without abnormal findings: Secondary | ICD-10-CM

## 2017-11-11 DIAGNOSIS — E119 Type 2 diabetes mellitus without complications: Secondary | ICD-10-CM | POA: Diagnosis not present

## 2017-11-11 DIAGNOSIS — E782 Mixed hyperlipidemia: Secondary | ICD-10-CM | POA: Diagnosis not present

## 2017-11-11 DIAGNOSIS — M8580 Other specified disorders of bone density and structure, unspecified site: Secondary | ICD-10-CM

## 2017-11-11 DIAGNOSIS — Z0001 Encounter for general adult medical examination with abnormal findings: Secondary | ICD-10-CM | POA: Diagnosis not present

## 2017-11-11 LAB — LIPID PANEL
CHOL/HDL RATIO: 3
Cholesterol: 151 mg/dL (ref 0–200)
HDL: 52.8 mg/dL (ref >39.00)
LDL Cholesterol: 85 mg/dL (ref 0–99)
NonHDL: 97.97
TRIGLYCERIDES: 63 mg/dL (ref 0.0–149.0)
VLDL: 12.6 mg/dL (ref 0.0–40.0)

## 2017-11-11 LAB — COMPREHENSIVE METABOLIC PANEL
ALK PHOS: 68 U/L (ref 39–117)
ALT: 15 U/L (ref 0–35)
AST: 22 U/L (ref 0–37)
Albumin: 4.6 g/dL (ref 3.5–5.2)
BILIRUBIN TOTAL: 0.4 mg/dL (ref 0.2–1.2)
BUN: 11 mg/dL (ref 6–23)
CALCIUM: 9.8 mg/dL (ref 8.4–10.5)
CO2: 27 meq/L (ref 19–32)
CREATININE: 0.88 mg/dL (ref 0.40–1.20)
Chloride: 102 mEq/L (ref 96–112)
GFR: 67.59 mL/min (ref >60.00)
GLUCOSE: 146 mg/dL — AB (ref 70–99)
Potassium: 3.9 mEq/L (ref 3.5–5.1)
Sodium: 138 mEq/L (ref 135–145)
TOTAL PROTEIN: 7.6 g/dL (ref 6.0–8.3)

## 2017-11-11 LAB — CBC WITH DIFFERENTIAL/PLATELET
BASOS ABS: 0 10*3/uL (ref 0.0–0.1)
Basophils Relative: 0.5 % (ref 0.0–3.0)
EOS ABS: 0.1 10*3/uL (ref 0.0–0.7)
Eosinophils Relative: 1.6 % (ref 0.0–5.0)
HCT: 39.8 % (ref 36.0–46.0)
Hemoglobin: 13.8 g/dL (ref 12.0–15.0)
LYMPHS ABS: 2.5 10*3/uL (ref 0.7–4.0)
LYMPHS PCT: 32.5 % (ref 12.0–46.0)
MCHC: 34.7 g/dL (ref 30.0–36.0)
MCV: 87.2 fl (ref 78.0–100.0)
Monocytes Absolute: 0.5 10*3/uL (ref 0.1–1.0)
Monocytes Relative: 6.9 % (ref 3.0–12.0)
NEUTROS ABS: 4.4 10*3/uL (ref 1.4–7.7)
NEUTROS PCT: 58.5 % (ref 43.0–77.0)
PLATELETS: 210 10*3/uL (ref 150.0–400.0)
RBC: 4.56 Mil/uL (ref 3.87–5.11)
RDW: 13.7 % (ref 11.5–15.5)
WBC: 7.6 10*3/uL (ref 4.0–10.5)

## 2017-11-11 LAB — MICROALBUMIN / CREATININE URINE RATIO
CREATININE, U: 72.3 mg/dL
Microalb Creat Ratio: 1 mg/g (ref 0.0–30.0)
Microalb, Ur: <0.7 mg/dL (ref 0.0–1.9)

## 2017-11-11 LAB — HEMOGLOBIN A1C: HEMOGLOBIN A1C: 6.4 % (ref 4.6–6.5)

## 2017-11-11 LAB — TSH: TSH: 1.13 u[IU]/mL (ref 0.35–4.50)

## 2017-11-11 NOTE — Assessment & Plan Note (Signed)
H/o of presumed white coat htn Advised to check bp at home or gym Not on any medication Check cmp, tsh, cbc

## 2017-11-11 NOTE — Assessment & Plan Note (Signed)
Diet controlled Cmp, lipid, tsh Continue regular exercise

## 2017-11-11 NOTE — Assessment & Plan Note (Signed)
dexa up to date Exercising regularly - mostly pool - will start walking more Taking calcium, vitamin d

## 2017-11-11 NOTE — Assessment & Plan Note (Signed)
Exercising regularly, healthy diet Sugars have been well controlled with diet Check a1c, urine micro Ok to f/u in one year given good control and current lifestyle

## 2017-11-12 ENCOUNTER — Encounter: Payer: Self-pay | Admitting: Internal Medicine

## 2017-11-26 ENCOUNTER — Encounter: Payer: Self-pay | Admitting: Internal Medicine

## 2017-12-09 ENCOUNTER — Ambulatory Visit: Payer: PPO | Admitting: Nurse Practitioner

## 2017-12-26 ENCOUNTER — Encounter: Payer: Self-pay | Admitting: Nurse Practitioner

## 2017-12-26 ENCOUNTER — Ambulatory Visit (INDEPENDENT_AMBULATORY_CARE_PROVIDER_SITE_OTHER): Payer: PPO | Admitting: Nurse Practitioner

## 2017-12-26 VITALS — BP 150/60 | HR 74 | Ht 67.0 in | Wt 154.0 lb

## 2017-12-26 DIAGNOSIS — Z86010 Personal history of colonic polyps: Secondary | ICD-10-CM | POA: Diagnosis not present

## 2017-12-26 DIAGNOSIS — K5900 Constipation, unspecified: Secondary | ICD-10-CM

## 2017-12-26 MED ORDER — NA SULFATE-K SULFATE-MG SULF 17.5-3.13-1.6 GM/177ML PO SOLN: ORAL | 0 refills | Status: DC

## 2017-12-26 NOTE — Patient Instructions (Signed)
If you are age 70 or older, your body mass index should be between 23-30. Your Body mass index is 24.12 kg/m. If this is out of the aforementioned range listed, please consider follow up with your Primary Care Provider.  If you are age 48 or younger, your body mass index should be between 19-25. Your Body mass index is 24.12 kg/m. If this is out of the aformentioned range listed, please consider follow up with your Primary Care Provider.   You have been scheduled for a colonoscopy. Please follow written instructions given to you at your visit today.  Please pick up your prep supplies at the pharmacy within the next 1-3 days. If you use inhalers (even only as needed), please bring them with you on the day of your procedure. Your physician has requested that you go to www.startemmi.com and enter the access code given to you at your visit today. This web site gives a general overview about your procedure. However, you should still follow specific instructions given to you by our office regarding your preparation for the procedure.  We have sent the following medications to your pharmacy for you to pick up at your convenience: Suprep  Use Equate stool softener 100 mg for one week. Then, 200 mg if not helping. Then, if no improvement take 1/2 capful of Miralax daily.  Thank you for choosing me and [facility].   Tye Savoy, NP

## 2017-12-26 NOTE — Progress Notes (Signed)
Chief Complaint:  constipation  Referring Provider:  self    ASSESSMENT AND PLAN;   72 yo female with hx of adenomatous colon polyps in 2007, none on one in 2014 here with bowel changes. Developed unexplained constipation one year ago. No rectal bleeding. Last colonoscopy in 2014.  -Based on current colon cancer screening guidelines not due for colonoscopy. However, given bowel changes it is reasonable, and she wishes to proceed with diagnostic colonoscopy.  -The risks and benefits of colonoscopy with possible polypectomy were discussed and the patient agrees to proceed.  -in the interim she will start stool softeners and if no improving she will add daily Miralax 1/2 capful daily. We can titrate upwards based on need.    HPI:    Patient is a 72 year old female known previously to Dr. Sharlett Iles.  She had a 50mm ascending colon adenoma removed on colonoscopy in 2007. She has another colonoscopy 2014. The exam was complete with an excellent prep. No polyps found, exam was normal.   Patient is for evaluation of constipation which started about a year ago. She feels like frequency is the same but stools are harder and often consists of little ballls.  She takes Miralax after a hard stool and generally needs only one dose to correct things until it happens again about 3 days later. Drinks a lot of water. Eats a lot fruit and vegetables. No blood in stool. No abdominal pain. No weight loss. She doesn't take an presecription meds, just supplements. Her weight is stable. CBC, serum chemistries early June WNL.    Past Medical History:  Diagnosis Date  . Diabetes mellitus without complication (HCC)    diet controlled  . Environmental allergies   . Ganglion cyst of left foot 06/22/2013  . Hyperlipidemia    low HDL     Past Surgical History:  Procedure Laterality Date  . COLONOSCOPY  2007   Adenomatous polyp  . COLONOSCOPY  2014   neg  . GANGLION CYST EXCISION Left    left foot  .  TONSILLECTOMY AND ADENOIDECTOMY  age 83  . TRIGGER FINGER RELEASE Left 08/15/2016   Family History  Problem Relation Age of Onset  . Allergies Mother   . Diabetes Mother   . Heart attack Father        > 30  . Diabetes Sister   . Alzheimer's disease Brother   . Colon cancer Maternal Grandfather   . Stroke Neg Hx   . Cancer Neg Hx    Social History   Tobacco Use  . Smoking status: Never Smoker  . Smokeless tobacco: Never Used  Substance Use Topics  . Alcohol use: No  . Drug use: No   Current Outpatient Medications  Medication Sig Dispense Refill  . Calcium Carbonate-Vitamin D (CALCIUM 600 + D PO) Take 1 tablet by mouth 2 (two) times daily.     . cholecalciferol (VITAMIN D) 1000 UNITS tablet Take 1,000 Units by mouth daily.    . Cholecalciferol (VITAMIN D3) 2000 UNITS TABS Take 1 tablet by mouth 2 (two) times daily.     . clobetasol cream (TEMOVATE) 0.17 % Apply 1 application topically as needed.    . Coenzyme Q10 (COQ10 PO) Take 300 mg by mouth daily.      . fish oil-omega-3 fatty acids 1000 MG capsule Take 2 g by mouth daily.      . Flaxseed, Linseed, (FLAX SEED OIL) 1000 MG CAPS Take 1 capsule by mouth 2 (two)  times daily.     . fluticasone (FLONASE) 50 MCG/ACT nasal spray PLACE 2 SPRAYS IN EACH NOSTRIL DAILY    AS NEEDED 16 g 0  . Garlic 563 MG TABS Take 1,000 mg by mouth 2 (two) times daily.     Marland Kitchen glucose blood test strip 1 each by Other route 2 (two) times daily. Use to check blood sugars twice a day. Dx E11.9 100 each 3  . ibuprofen (ADVIL,MOTRIN) 200 MG tablet Take 200 mg by mouth. Take 3 pills prn    . ketoconazole (NIZORAL) 2 % cream Apply 1 application topically 2 (two) times daily as needed for irritation. 15 g 0  . Lancet Device MISC Test blood sugar before each meal and at bedtime. 1 each 11  . Lancets (FREESTYLE) lancets 1 each by Other route 2 (two) times daily. Use to check blood sugars twice a day Dx E11.9 60 each 11  . Multiple Vitamins-Minerals  (HAIR/SKIN/NAILS PO) Take 1 tablet by mouth daily.     . mupirocin ointment (BACTROBAN) 2 % Apply 1 application topically as needed.    . NON FORMULARY Enhanced Energy MVI-Take 1 pill bid    . pyridOXINE (VITAMIN B-6) 100 MG tablet Take 100 mg by mouth 2 (two) times daily.     Marland Kitchen triamcinolone cream (KENALOG) 0.1 % Apply 1 application topically as needed.    . vitamin B-12 (CYANOCOBALAMIN) 1000 MCG tablet Take 1,000 mcg by mouth daily.       No current facility-administered medications for this visit.    Allergies  Allergen Reactions  . Atropine   . Belladonna Alkaloids   . Prevnar [Pneumococcal 13-Val Conj Vacc] Other (See Comments)    Itching, redness, and rash  . Eggs Or Egg-Derived Products     Diarrhea,Gas     Review of Systems: All systems reviewed and negative except where noted in HPI.   Creatinine clearance cannot be calculated (Patient's most recent lab result is older than the maximum 21 days allowed.)   Physical Exam:    Wt Readings from Last 3 Encounters:  12/26/17 154 lb (69.9 kg)  11/11/17 151 lb (68.5 kg)  08/25/17 159 lb (72.1 kg)    BP (!) 150/60   Pulse 74   Ht 5\' 7"  (1.702 m)   Wt 154 lb (69.9 kg)   BMI 24.12 kg/m   Constitutional:  Pleasant female in no acute distress. Psychiatric: Normal mood and affect. Behavior is normal. EENT: Pupils normal.  Conjunctivae are normal. No scleral icterus. Neck supple.  Cardiovascular: Normal rate, regular rhythm. No edema Pulmonary/chest: Effort normal and breath sounds normal. No wheezing, rales or rhonchi. Abdominal: Soft, nondistended, nontender. Bowel sounds active throughout. There are no masses palpable. No hepatomegaly. Neurological: Alert and oriented to person place and time. Skin: Skin is warm and dry. No rashes noted.  Tye Savoy, NP  12/26/2017, 10:53 AM

## 2017-12-29 ENCOUNTER — Encounter: Payer: Self-pay | Admitting: Nurse Practitioner

## 2017-12-30 NOTE — Progress Notes (Signed)
Agree with a assessment and plan as outlined.

## 2018-02-11 ENCOUNTER — Encounter: Payer: Self-pay | Admitting: Gastroenterology

## 2018-02-25 ENCOUNTER — Ambulatory Visit (AMBULATORY_SURGERY_CENTER): Payer: PPO | Admitting: Gastroenterology

## 2018-02-25 ENCOUNTER — Encounter: Payer: Self-pay | Admitting: Gastroenterology

## 2018-02-25 VITALS — BP 119/65 | HR 68 | Temp 99.3°F | Resp 16 | Ht 67.0 in | Wt 154.0 lb

## 2018-02-25 DIAGNOSIS — D120 Benign neoplasm of cecum: Secondary | ICD-10-CM | POA: Diagnosis not present

## 2018-02-25 DIAGNOSIS — K5900 Constipation, unspecified: Secondary | ICD-10-CM | POA: Diagnosis not present

## 2018-02-25 DIAGNOSIS — E119 Type 2 diabetes mellitus without complications: Secondary | ICD-10-CM | POA: Diagnosis not present

## 2018-02-25 DIAGNOSIS — Z86010 Personal history of colonic polyps: Secondary | ICD-10-CM | POA: Diagnosis not present

## 2018-02-25 DIAGNOSIS — K635 Polyp of colon: Secondary | ICD-10-CM | POA: Diagnosis not present

## 2018-02-25 DIAGNOSIS — D122 Benign neoplasm of ascending colon: Secondary | ICD-10-CM | POA: Diagnosis not present

## 2018-02-25 MED ORDER — SODIUM CHLORIDE 0.9 % IV SOLN: 500.0000 mL | Freq: Once | INTRAVENOUS | Status: DC

## 2018-02-25 NOTE — Patient Instructions (Signed)
Continue present medications. Please read handout on Polyps.    YOU HAD AN ENDOSCOPIC PROCEDURE TODAY AT [facility]:   Refer to the procedure report that was given to you for any specific questions about what was found during the examination.  If the procedure report does not answer your questions, please call your gastroenterologist to clarify.  If you requested that your care partner not be given the details of your procedure findings, then the procedure report has been included in a sealed envelope for you to review at your convenience later.  YOU SHOULD EXPECT: Some feelings of bloating in the abdomen. Passage of more gas than usual.  Walking can help get rid of the air that was put into your GI tract during the procedure and reduce the bloating. If you had a lower endoscopy (such as a colonoscopy or flexible sigmoidoscopy) you may notice spotting of blood in your stool or on the toilet paper. If you underwent a bowel prep for your procedure, you may not have a normal bowel movement for a few days.  Please Note:  You might notice some irritation and congestion in your nose or some drainage.  This is from the oxygen used during your procedure.  There is no need for concern and it should clear up in a day or so.  SYMPTOMS TO REPORT IMMEDIATELY:   Following lower endoscopy (colonoscopy or flexible sigmoidoscopy):  Excessive amounts of blood in the stool  Significant tenderness or worsening of abdominal pains  Swelling of the abdomen that is new, acute  Fever of 100F or higher  For urgent or emergent issues, a gastroenterologist can be reached at any hour by calling [phone].   DIET:  We do recommend a small meal at first, but then you may proceed to your regular diet.  Drink plenty of fluids but you should avoid alcoholic beverages for 24 hours.  ACTIVITY:  You should plan to take it easy for the rest of today and you should NOT DRIVE or use heavy machinery until  tomorrow (because of the sedation medicines used during the test).    FOLLOW UP: Our staff will call the number listed on your records the next business day following your procedure to check on you and address any questions or concerns that you may have regarding the information given to you following your procedure. If we do not reach you, we will leave a message.  However, if you are feeling well and you are not experiencing any problems, there is no need to return our call.  We will assume that you have returned to your regular daily activities without incident.  If any biopsies were taken you will be contacted by phone or by letter within the next 1-3 weeks.  Please call us at [phone] if you have not heard about the biopsies in 3 weeks.    SIGNATURES/CONFIDENTIALITY: You and/or your care partner have signed paperwork which will be entered into your electronic medical record.  These signatures attest to the fact that that the information above on your After Visit Summary has been reviewed and is understood.  Full responsibility of the confidentiality of this discharge information lies with you and/or your care-partner.

## 2018-02-25 NOTE — Op Note (Signed)
[facility] Patient Name: Lisa Mcgee Procedure Date: 02/25/2018 10:03 AM MRN: [id number] Endoscopist: Remo Lipps P. Havery Moros , MD Age: 72 Referring MD:  Date of Birth: 12/25/1945 Gender: Female Account #: [id number] Procedure:                Colonoscopy Indications:              Change in bowel habits - improved with taking stool                            softener, history of colon polyps (remote history                            of an adenoma) Medicines:                Monitored Anesthesia Care Procedure:                Pre-Anesthesia Assessment:                           - Prior to the procedure, a History and Physical                            was performed, and patient medications and                            allergies were reviewed. The patient's tolerance of                            previous anesthesia was also reviewed. The risks                            and benefits of the procedure and the sedation                            options and risks were discussed with the patient.                            All questions were answered, and informed consent                            was obtained. Prior Anticoagulants: The patient has                            taken no previous anticoagulant or antiplatelet                            agents. ASA Grade Assessment: II - A patient with                            mild systemic disease. After reviewing the risks                            and benefits, the patient was deemed in  satisfactory condition to undergo the procedure.                           After obtaining informed consent, the colonoscope                            was passed under direct vision. Throughout the                            procedure, the patient's blood pressure, pulse, and                            oxygen saturations were monitored continuously. The                            Colonoscope was introduced through  the anus and                            advanced to the the terminal ileum, with                            identification of the appendiceal orifice and IC                            valve. The colonoscopy was performed without                            difficulty. The patient tolerated the procedure                            well. The quality of the bowel preparation was                            good. The terminal ileum, ileocecal valve,                            appendiceal orifice, and rectum were photographed. Scope In: 10:22:40 AM Scope Out: 10:49:34 AM Scope Withdrawal Time: 0 hours 18 minutes 27 seconds  Total Procedure Duration: 0 hours 26 minutes 54 seconds  Findings:                 The perianal and digital rectal examinations were                            normal.                           The terminal ileum appeared normal.                           A 6 mm polyp was found in the cecum. The polyp was                            sessile. The polyp was removed with a cold snare.  Resection and retrieval were complete.                           A 3 mm polyp was found in the ascending colon. The                            polyp was sessile. The polyp was removed with a                            cold snare. Resection and retrieval were complete.                           The left colon was extremely tortuous / angulated.                           The exam was otherwise without abnormality. Complications:            No immediate complications. Estimated blood loss:                            Minimal. Estimated Blood Loss:     Estimated blood loss was minimal. Impression:               - The examined portion of the ileum was normal.                           - One 6 mm polyp in the cecum, removed with a cold                            snare. Resected and retrieved.                           - One 3 mm polyp in the ascending colon, removed                             with a cold snare. Resected and retrieved.                           - Tortuous colon.                           - The examination was otherwise normal. Recommendation:           - Patient has a contact number available for                            emergencies. The signs and symptoms of potential                            delayed complications were discussed with the                            patient. Return to normal activities tomorrow.  Written discharge instructions were provided to the                            patient.                           - Resume previous diet.                           - Continue present medications.                           - Await pathology results.                           - Repeat colonoscopy for surveillance based on                            pathology results. Remo Lipps P. Eliasar Hlavaty, MD 02/25/2018 10:54:22 AM This report has been signed electronically.

## 2018-02-25 NOTE — Progress Notes (Signed)
Called to room to assist during endoscopic procedure.  Patient ID and intended procedure confirmed with present staff. Received instructions for my participation in the procedure from the performing physician.  

## 2018-02-26 ENCOUNTER — Ambulatory Visit (INDEPENDENT_AMBULATORY_CARE_PROVIDER_SITE_OTHER): Payer: PPO | Admitting: Internal Medicine

## 2018-02-26 ENCOUNTER — Telehealth: Payer: Self-pay | Admitting: *Deleted

## 2018-02-26 ENCOUNTER — Encounter: Payer: Self-pay | Admitting: Internal Medicine

## 2018-02-26 VITALS — BP 122/60 | HR 63 | Temp 98.2°F | Resp 16 | Ht 67.0 in | Wt 149.8 lb

## 2018-02-26 DIAGNOSIS — J0100 Acute maxillary sinusitis, unspecified: Secondary | ICD-10-CM | POA: Diagnosis not present

## 2018-02-26 MED ORDER — AMOXICILLIN 500 MG PO CAPS: 500.0000 mg | ORAL_CAPSULE | Freq: Three times a day (TID) | ORAL | 0 refills | Status: DC

## 2018-02-26 NOTE — Assessment & Plan Note (Signed)
Likely bacterial  Start amoxicillin Allergy meds otc cold medications Rest, fluid Call if no improvement

## 2018-02-26 NOTE — Telephone Encounter (Signed)
  Follow up Call-  Call back number 02/25/2018  Post procedure Call Back phone  # [phone] hm  Permission to leave phone message Yes  Some recent data might be hidden     Patient questions:  Do you have a fever, pain , or abdominal swelling? No. Pain Score  0 *  Have you tolerated food without any problems? Yes.    Have you been able to return to your normal activities? Yes.    Do you have any questions about your discharge instructions: Diet   No. Medications  No. Follow up visit  No.  Do you have questions or concerns about your Care? No.  Actions: * If pain score is 4 or above: No action needed, pain <4.

## 2018-02-26 NOTE — Progress Notes (Signed)
Subjective:    Patient ID: Lisa Mcgee, female    DOB: [birth year], 72 y.o.   MRN: [id number]  HPI She is here for an acute visit for cold symptoms.  Her symptoms started one week ago.  She is experiencing chills, nasal congestion, sinus pain/pressure, ear pain, sore throat, cough, mild SOB, body aches and headaches that are mild.  She denies fever and wheeze.  She was able to cough up some discolored sputum this morning.    She has taken zyrtec and mucinex and two amoxicillin she had left over.  Medications and allergies reviewed with patient and updated if appropriate.  Patient Active Problem List   Diagnosis Date Noted  . Elevated blood pressure reading 11/11/2017  . Allergic rhinitis 03/14/2016  . Pes cavus 04/11/2014  . Vitamin D deficiency 08/04/2013  . Osteopenia 04/17/2013  . Rhinitis, non-allergic 05/26/2011  . SLEEP DISORDER 08/27/2010  . SYNCOPE 06/28/2008  . Palpitations 06/28/2008  . Hyperlipidemia 04/20/2008  . History of colonic polyps 04/20/2008  . Diabetes type 2, controlled ([place]) 05/28/2007    Current Outpatient Medications on File Prior to Visit  Medication Sig Dispense Refill  . Calcium Carbonate-Vitamin D (CALCIUM 600 + D PO) Take 1 tablet by mouth 2 (two) times daily.     . cholecalciferol (VITAMIN D) 1000 UNITS tablet Take 1,000 Units by mouth daily.    . Cholecalciferol (VITAMIN D3) 2000 UNITS TABS Take 1 tablet by mouth 2 (two) times daily.     . clobetasol cream (TEMOVATE) 9.51 % Apply 1 application topically as needed.    . Coenzyme Q10 (COQ10 PO) Take 300 mg by mouth daily.      . fish oil-omega-3 fatty acids 1000 MG capsule Take 2 g by mouth daily.      . Flaxseed, Linseed, (FLAX SEED OIL) 1000 MG CAPS Take 1 capsule by mouth 2 (two) times daily.     . Garlic 884 MG TABS Take 1,000 mg by mouth 2 (two) times daily.     Marland Kitchen glucose blood test strip 1 each by Other route 2 (two) times daily. Use to check blood sugars twice a day. Dx E11.9 100  each 3  . ibuprofen (ADVIL,MOTRIN) 200 MG tablet Take 200 mg by mouth. Take 3 pills prn    . ketoconazole (NIZORAL) 2 % cream Apply 1 application topically 2 (two) times daily as needed for irritation. 15 g 0  . Lancet Device MISC Test blood sugar before each meal and at bedtime. 1 each 11  . Lancets (FREESTYLE) lancets 1 each by Other route 2 (two) times daily. Use to check blood sugars twice a day Dx E11.9 60 each 11  . Multiple Vitamins-Minerals (HAIR/SKIN/NAILS PO) Take 1 tablet by mouth daily.     . mupirocin ointment (BACTROBAN) 2 % Apply 1 application topically as needed.    . NON FORMULARY Enhanced Energy MVI-Take 1 pill bid    . pyridOXINE (VITAMIN B-6) 100 MG tablet Take 100 mg by mouth 2 (two) times daily.     Dwyane Dee SILVER GEL Apply topically.    . triamcinolone cream (KENALOG) 0.1 % Apply 1 application topically as needed.    . vitamin B-12 (CYANOCOBALAMIN) 1000 MCG tablet Take 1,000 mcg by mouth daily.       No current facility-administered medications on file prior to visit.     Past Medical History:  Diagnosis Date  . Allergy   . Arthritis    bil fingers  .  Cataract    bil cataracts  . Diabetes mellitus without complication (HCC)    diet controlled  . Environmental allergies   . Ganglion cyst of left foot 06/22/2013  . Hyperlipidemia    low HDL  . Osteopenia     Past Surgical History:  Procedure Laterality Date  . COLONOSCOPY  2007   Adenomatous polyp  . COLONOSCOPY  2014   neg  . GANGLION CYST EXCISION Left    left foot  . POLYPECTOMY    . TONSILLECTOMY AND ADENOIDECTOMY  age 87  . TRIGGER FINGER RELEASE Left 08/15/2016    Social History   Socioeconomic History  . Marital status: Married    Spouse name: Not on file  . Number of children: Not on file  . Years of education: Not on file  . Highest education level: Not on file  Occupational History  . Not on file  Social Needs  . Financial resource strain: Not on file  . Food insecurity:     Worry: Not on file    Inability: Not on file  . Transportation needs:    Medical: Not on file    Non-medical: Not on file  Tobacco Use  . Smoking status: Never Smoker  . Smokeless tobacco: Never Used  Substance and Sexual Activity  . Alcohol use: No  . Drug use: No  . Sexual activity: Not on file  Lifestyle  . Physical activity:    Days per week: Not on file    Minutes per session: Not on file  . Stress: Not on file  Relationships  . Social connections:    Talks on phone: Not on file    Gets together: Not on file    Attends religious service: Not on file    Active member of club or organization: Not on file    Attends meetings of clubs or organizations: Not on file    Relationship status: Not on file  Other Topics Concern  . Not on file  Social History Narrative   Exercise:  Water aerobics twice a week    Family History  Problem Relation Age of Onset  . Allergies Mother   . Diabetes Mother   . Heart attack Father        > 58  . Diabetes Sister   . Alzheimer's disease Brother   . Colon cancer Maternal Grandfather   . Colitis Maternal Grandfather        died at 6 years old  . Stroke Neg Hx   . Cancer Neg Hx   . Esophageal cancer Neg Hx   . Rectal cancer Neg Hx   . Stomach cancer Neg Hx     Review of Systems  Constitutional: Positive for chills. Negative for fever.  HENT: Positive for congestion, ear pain (intermittent), sinus pressure, sinus pain and sore throat.   Eyes: Positive for discharge (watering).  Respiratory: Positive for cough (green sputum up today) and shortness of breath (mild). Negative for wheezing.   Gastrointestinal: Negative for diarrhea and nausea.  Musculoskeletal: Positive for myalgias.  Neurological: Positive for headaches (mild at times). Negative for dizziness and light-headedness.       Objective:   Vitals:   02/26/18 0916  BP: 122/60  Pulse: 63  Resp: 16  Temp: 98.2 F (36.8 C)  SpO2: 98%   Filed Weights   02/26/18 0916   Weight: 149 lb 12.8 oz (67.9 kg)   Body mass index is 23.46 kg/m.  Wt Readings from Last  3 Encounters:  02/26/18 149 lb 12.8 oz (67.9 kg)  02/25/18 154 lb (69.9 kg)  12/26/17 154 lb (69.9 kg)     Physical Exam GENERAL APPEARANCE: Appears stated age, mildly ill appearing, NAD EYES: conjunctiva clear, no icterus HEENT: bilateral tympanic membranes and ear canals normal, oropharynx with mild erythema, nasal congestion, sinus tenderness, no thyromegaly, trachea midline, no cervical or supraclavicular lymphadenopathy LUNGS: Clear to auscultation without wheeze or crackles, unlabored breathing, good air entry bilaterally CARDIOVASCULAR: Normal S1,S2 without murmurs, no edema SKIN: warm, dry        Assessment & Plan:   See Problem List for Assessment and Plan of chronic medical problems.

## 2018-02-26 NOTE — Patient Instructions (Signed)
Take the entire course of antibiotic.  Continue the over the counter medications.     Call if no improvement      Sinusitis, Adult Sinusitis is soreness and inflammation of your sinuses. Sinuses are hollow spaces in the bones around your face. Your sinuses are located:  Around your eyes.  In the middle of your forehead.  Behind your nose.  In your cheekbones.  Your sinuses and nasal passages are lined with a stringy fluid (mucus). Mucus normally drains out of your sinuses. When your nasal tissues become inflamed or swollen, the mucus can become trapped or blocked so air cannot flow through your sinuses. This allows bacteria, viruses, and funguses to grow, which leads to infection. Sinusitis can develop quickly and last for 7?10 days (acute) or for more than 12 weeks (chronic). Sinusitis often develops after a cold. What are the causes? This condition is caused by anything that creates swelling in the sinuses or stops mucus from draining, including:  Allergies.  Asthma.  Bacterial or viral infection.  Abnormally shaped bones between the nasal passages.  Nasal growths that contain mucus (nasal polyps).  Narrow sinus openings.  Pollutants, such as chemicals or irritants in the air.  A foreign object stuck in the nose.  A fungal infection. This is rare.  What increases the risk? The following factors may make you more likely to develop this condition:  Having allergies or asthma.  Having had a recent cold or respiratory tract infection.  Having structural deformities or blockages in your nose or sinuses.  Having a weak immune system.  Doing a lot of swimming or diving.  Overusing nasal sprays.  Smoking.  What are the signs or symptoms? The main symptoms of this condition are pain and a feeling of pressure around the affected sinuses. Other symptoms include:  Upper toothache.  Earache.  Headache.  Bad breath.  Decreased sense of smell and taste.  A  cough that may get worse at night.  Fatigue.  Fever.  Thick drainage from your nose. The drainage is often green and it may contain pus (purulent).  Stuffy nose or congestion.  Postnasal drip. This is when extra mucus collects in the throat or back of the nose.  Swelling and warmth over the affected sinuses.  Sore throat.  Sensitivity to light.  How is this diagnosed? This condition is diagnosed based on symptoms, a medical history, and a physical exam. To find out if your condition is acute or chronic, your health care provider may:  Look in your nose for signs of nasal polyps.  Tap over the affected sinus to check for signs of infection.  View the inside of your sinuses using an imaging device that has a light attached (endoscope).  If your health care provider suspects that you have chronic sinusitis, you may also:  Be tested for allergies.  Have a sample of mucus taken from your nose (nasal culture) and checked for bacteria.  Have a mucus sample examined to see if your sinusitis is related to an allergy.  If your sinusitis does not respond to treatment and it lasts longer than 8 weeks, you may have an MRI or CT scan to check your sinuses. These scans also help to determine how severe your infection is. In rare cases, a bone biopsy may be done to rule out more serious types of fungal sinus disease. How is this treated? Treatment for sinusitis depends on the cause and whether your condition is chronic or acute. If  a virus is causing your sinusitis, your symptoms will go away on their own within 10 days. You may be given medicines to relieve your symptoms, including:  Topical nasal decongestants. They shrink swollen nasal passages and let mucus drain from your sinuses.  Antihistamines. These drugs block inflammation that is triggered by allergies. This can help to ease swelling in your nose and sinuses.  Topical nasal corticosteroids. These are nasal sprays that ease  inflammation and swelling in your nose and sinuses.  Nasal saline washes. These rinses can help to get rid of thick mucus in your nose.  If your condition is caused by bacteria, you will be given an antibiotic medicine. If your condition is caused by a fungus, you will be given an antifungal medicine. Surgery may be needed to correct underlying conditions, such as narrow nasal passages. Surgery may also be needed to remove polyps. Follow these instructions at home: Medicines  Take, use, or apply over-the-counter and prescription medicines only as told by your health care provider. These may include nasal sprays.  If you were prescribed an antibiotic medicine, take it as told by your health care provider. Do not stop taking the antibiotic even if you start to feel better. Hydrate and Humidify  Drink enough water to keep your urine clear or pale yellow. Staying hydrated will help to thin your mucus.  Use a cool mist humidifier to keep the humidity level in your home above 50%.  Inhale steam for 10-15 minutes, 3-4 times a day or as told by your health care provider. You can do this in the bathroom while a hot shower is running.  Limit your exposure to cool or dry air. Rest  Rest as much as possible.  Sleep with your head raised (elevated).  Make sure to get enough sleep each night. General instructions  Apply a warm, moist washcloth to your face 3-4 times a day or as told by your health care provider. This will help with discomfort.  Wash your hands often with soap and water to reduce your exposure to viruses and other germs. If soap and water are not available, use hand sanitizer.  Do not smoke. Avoid being around people who are smoking (secondhand smoke).  Keep all follow-up visits as told by your health care provider. This is important. Contact a health care provider if:  You have a fever.  Your symptoms get worse.  Your symptoms do not improve within 10 days. Get help  right away if:  You have a severe headache.  You have persistent vomiting.  You have pain or swelling around your face or eyes.  You have vision problems.  You develop confusion.  Your neck is stiff.  You have trouble breathing. This information is not intended to replace advice given to you by your health care provider. Make sure you discuss any questions you have with your health care provider. Document Released: 05/27/2005 Document Revised: 01/21/2016 Document Reviewed: 03/22/2015 Elsevier Interactive Patient Education  [employer].

## 2018-02-27 ENCOUNTER — Encounter: Payer: Self-pay | Admitting: Gastroenterology

## 2018-03-06 ENCOUNTER — Encounter: Payer: Self-pay | Admitting: Internal Medicine

## 2018-03-09 ENCOUNTER — Ambulatory Visit: Payer: PPO | Admitting: Internal Medicine

## 2018-03-09 MED ORDER — DOXYCYCLINE HYCLATE 100 MG PO TABS: 100.0000 mg | ORAL_TABLET | Freq: Two times a day (BID) | ORAL | 0 refills | Status: DC

## 2018-03-10 ENCOUNTER — Ambulatory Visit: Payer: PPO | Admitting: Internal Medicine

## 2018-03-11 ENCOUNTER — Telehealth: Payer: Self-pay | Admitting: *Deleted

## 2018-03-11 ENCOUNTER — Encounter: Payer: Self-pay | Admitting: *Deleted

## 2018-03-11 NOTE — Telephone Encounter (Signed)
This encounter was created in error - please disregard.

## 2018-03-11 NOTE — Telephone Encounter (Signed)
Pt seen 02/26/18 by Dr. Quay Burow; sinusitis. Pt placed on amoxicillin; reports antibiotic changed 9/30 to doxycycline. Pt states "I just feel so bad since I started that medication."  Pt started 03/09/18, ordered BID; took both doses yesterday and  morning dose today as well. Reports nausea, no emesis, no diarrhea. Poor appetite.  States "Foggy feeling" increased fatigue, and "Hot and cold at times."  States ears feel "clogged" but cough is better, no longer productive. Denies fever, tenderness at sinus area, sob, CP. "Head feels full". Also states heart beats fast at times, unsure of rate. Pt questioning if she should continue antibiotic, if alternate one will be called in. Pt made aware she may need to be seen. Please advise:  [phone]

## 2018-03-12 NOTE — Telephone Encounter (Signed)
Pt aware of response below. Will stop doxycycline and call tomorrow if needed.

## 2018-03-12 NOTE — Telephone Encounter (Signed)
Stop doxycycline.  Give her symptoms a day to see if she needs additional antibiotics-hopefully we can avoid.  Have her call us tomorrow with an update so that if we do need to start something we can do it before the weekend.  Continue sinus/cold medications-nasal spray such as Flonase or Sudafed will help best with the ears.

## 2018-03-12 NOTE — Addendum Note (Signed)
Addended by: Binnie Rail on: 03/12/2018 07:44 AM   Modules accepted: Orders

## 2018-03-13 MED ORDER — AZITHROMYCIN 250 MG PO TABS: ORAL_TABLET | ORAL | 0 refills | Status: DC

## 2018-03-13 NOTE — Telephone Encounter (Signed)
Pt called this morning to tell pcp that she is still having the same symptoms; throat, ears, headaches; pt is calling to have an abx called in for her; if she can be contacted to know when its ready that would be nice

## 2018-03-13 NOTE — Telephone Encounter (Signed)
Sent zpak.

## 2018-03-13 NOTE — Addendum Note (Signed)
Addended by: Binnie Rail on: 03/13/2018 09:56 AM   Modules accepted: Orders

## 2018-03-13 NOTE — Telephone Encounter (Signed)
Pt aware.

## 2018-03-24 ENCOUNTER — Telehealth: Payer: Self-pay | Admitting: Internal Medicine

## 2018-03-24 NOTE — Telephone Encounter (Signed)
Copied from [place] [id number]. Topic: Quick Communication - See Telephone Encounter >> Mar 24, 2018 11:04 AM Conception Chancy, NT wrote: CRM for notification. See Telephone encounter for: 03/24/18.  Patient is calling and states that she received the first shingle shot on 12/26/17 at [facility] and she was told to come back 6 months to get the 2nd one. She said she heard on TV this morning that she was supposed to get the 2nd one in 2 months. She would like to know which one is correct.

## 2018-03-24 NOTE — Telephone Encounter (Signed)
Spoke with pt and advised that 2nd shingles shot is 2-6 months after the first shot. Pt understood.

## 2018-04-21 DIAGNOSIS — H9312 Tinnitus, left ear: Secondary | ICD-10-CM | POA: Diagnosis not present

## 2018-04-21 DIAGNOSIS — IMO0001 Reserved for inherently not codable concepts without codable children: Secondary | ICD-10-CM | POA: Insufficient documentation

## 2018-04-21 DIAGNOSIS — H6121 Impacted cerumen, right ear: Secondary | ICD-10-CM | POA: Diagnosis not present

## 2018-04-21 DIAGNOSIS — H90A32 Mixed conductive and sensorineural hearing loss, unilateral, left ear with restricted hearing on the contralateral side: Secondary | ICD-10-CM | POA: Diagnosis not present

## 2018-04-21 DIAGNOSIS — H918X2 Other specified hearing loss, left ear: Secondary | ICD-10-CM | POA: Diagnosis not present

## 2018-04-21 DIAGNOSIS — H90A21 Sensorineural hearing loss, unilateral, right ear, with restricted hearing on the contralateral side: Secondary | ICD-10-CM | POA: Diagnosis not present

## 2018-04-27 DIAGNOSIS — D225 Melanocytic nevi of trunk: Secondary | ICD-10-CM | POA: Diagnosis not present

## 2018-04-27 DIAGNOSIS — L218 Other seborrheic dermatitis: Secondary | ICD-10-CM | POA: Diagnosis not present

## 2018-04-27 DIAGNOSIS — E119 Type 2 diabetes mellitus without complications: Secondary | ICD-10-CM | POA: Diagnosis not present

## 2018-04-27 DIAGNOSIS — Z7984 Long term (current) use of oral hypoglycemic drugs: Secondary | ICD-10-CM | POA: Diagnosis not present

## 2018-04-27 DIAGNOSIS — H43813 Vitreous degeneration, bilateral: Secondary | ICD-10-CM | POA: Diagnosis not present

## 2018-04-27 DIAGNOSIS — L570 Actinic keratosis: Secondary | ICD-10-CM | POA: Diagnosis not present

## 2018-04-27 DIAGNOSIS — L249 Irritant contact dermatitis, unspecified cause: Secondary | ICD-10-CM | POA: Diagnosis not present

## 2018-04-27 LAB — HM DIABETES EYE EXAM

## 2018-05-01 ENCOUNTER — Encounter: Payer: Self-pay | Admitting: Internal Medicine

## 2018-06-25 DIAGNOSIS — Z01419 Encounter for gynecological examination (general) (routine) without abnormal findings: Secondary | ICD-10-CM | POA: Diagnosis not present

## 2018-06-25 DIAGNOSIS — Z1231 Encounter for screening mammogram for malignant neoplasm of breast: Secondary | ICD-10-CM | POA: Diagnosis not present

## 2018-06-25 DIAGNOSIS — Z6824 Body mass index (BMI) 24.0-24.9, adult: Secondary | ICD-10-CM | POA: Diagnosis not present

## 2018-07-03 LAB — HM MAMMOGRAPHY

## 2018-11-10 ENCOUNTER — Encounter: Payer: Self-pay | Admitting: Internal Medicine

## 2018-11-16 NOTE — Progress Notes (Signed)
Subjective:    Patient ID: Lisa Mcgee, female    DOB: 03/13/1946, 73 y.o.   MRN: [id number]  HPI She is here for a physical exam.   She denies changes in her health since she was here.  She was not exercising for a while, but has been walking regularly for the past three weeks.    Medications and allergies reviewed with patient and updated if appropriate.  Patient Active Problem List   Diagnosis Date Noted  . Cataract 11/17/2018  . Allergic rhinitis 03/14/2016  . Pes cavus 04/11/2014  . Vitamin D deficiency 08/04/2013  . Osteopenia 04/17/2013  . Rhinitis, non-allergic 05/26/2011  . SYNCOPE 06/28/2008  . Palpitations 06/28/2008  . Hyperlipidemia 04/20/2008  . History of colonic polyps 04/20/2008  . Diabetes type 2, controlled ([place]) 05/28/2007    Current Outpatient Medications on File Prior to Visit  Medication Sig Dispense Refill  . Calcium Carbonate-Vitamin D (CALCIUM 600 + D PO) Take 1 tablet by mouth 2 (two) times daily.     . Cholecalciferol (VITAMIN D3) 2000 UNITS TABS Take 1 tablet by mouth 2 (two) times daily.     . clobetasol cream (TEMOVATE) 6.27 % Apply 1 application topically as needed.    . COD LIVER OIL PO Take by mouth.    . Coenzyme Q10 (COQ10 PO) Take 300 mg by mouth daily.      . fish oil-omega-3 fatty acids 1000 MG capsule Take 2 g by mouth daily.      . Flaxseed, Linseed, (FLAX SEED OIL) 1000 MG CAPS Take 1 capsule by mouth 2 (two) times daily.     . Garlic 035 MG TABS Take 1,000 mg by mouth 2 (two) times daily.     Marland Kitchen glucose blood test strip 1 each by Other route 2 (two) times daily. Use to check blood sugars twice a day. Dx E11.9 100 each 3  . ibuprofen (ADVIL,MOTRIN) 200 MG tablet Take 200 mg by mouth. Take 3 pills prn    . ketoconazole (NIZORAL) 2 % cream Apply 1 application topically 2 (two) times daily as needed for irritation. 15 g 0  . Lancet Device MISC Test blood sugar before each meal and at bedtime. 1 each 11  . Lancets (FREESTYLE) lancets  1 each by Other route 2 (two) times daily. Use to check blood sugars twice a day Dx E11.9 60 each 11  . Multiple Vitamins-Minerals (HAIR/SKIN/NAILS PO) Take 1 tablet by mouth daily.     . mupirocin ointment (BACTROBAN) 2 % Apply 1 application topically as needed.    . NON FORMULARY [employer] MVI-Take 1 pill bid    . pyridOXINE (VITAMIN B-6) 100 MG tablet Take 100 mg by mouth 2 (two) times daily.     Dwyane Dee SILVER GEL Apply topically.    . triamcinolone cream (KENALOG) 0.1 % Apply 1 application topically as needed.    . vitamin B-12 (CYANOCOBALAMIN) 1000 MCG tablet Take 1,000 mcg by mouth daily.       No current facility-administered medications on file prior to visit.     Past Medical History:  Diagnosis Date  . Allergy   . Arthritis    bil fingers  . Cataract    bil cataracts  . Diabetes mellitus without complication (H[facility])    diet controlled  . Environmental allergies   . Ganglion cyst of left foot 06/22/2013  . Hyperlipidemia    low HDL  . Osteopenia     Past Surgical  History:  Procedure Laterality Date  . COLONOSCOPY  2007   Adenomatous polyp  . COLONOSCOPY  2014   neg  . GANGLION CYST EXCISION Left    left foot  . POLYPECTOMY    . TONSILLECTOMY AND ADENOIDECTOMY  age 52  . TRIGGER FINGER RELEASE Left 08/15/2016    Social History   Socioeconomic History  . Marital status: Married    Spouse name: Not on file  . Number of children: Not on file  . Years of education: Not on file  . Highest education level: Not on file  Occupational History  . Not on file  Social Needs  . Financial resource strain: Not on file  . Food insecurity:    Worry: Not on file    Inability: Not on file  . Transportation needs:    Medical: Not on file    Non-medical: Not on file  Tobacco Use  . Smoking status: Never Smoker  . Smokeless tobacco: Never Used  Substance and Sexual Activity  . Alcohol use: No  . Drug use: No  . Sexual activity: Not on file  Lifestyle  .  Physical activity:    Days per week: Not on file    Minutes per session: Not on file  . Stress: Not on file  Relationships  . Social connections:    Talks on phone: Not on file    Gets together: Not on file    Attends religious service: Not on file    Active member of club or organization: Not on file    Attends meetings of clubs or organizations: Not on file    Relationship status: Not on file  Other Topics Concern  . Not on file  Social History Narrative   Exercise:  Water aerobics twice a week    Family History  Problem Relation Age of Onset  . Allergies Mother   . Diabetes Mother   . Heart attack Father        > 70  . Diabetes Sister   . Alzheimer's disease Brother   . Colon cancer Maternal Grandfather   . Colitis Maternal Grandfather        died at 50 years old  . Stroke Neg Hx   . Cancer Neg Hx   . Esophageal cancer Neg Hx   . Rectal cancer Neg Hx   . Stomach cancer Neg Hx     Review of Systems  Constitutional: Negative for fever.  Eyes: Negative for visual disturbance.  Respiratory: Negative for cough, shortness of breath and wheezing.   Cardiovascular: Negative for chest pain, palpitations and leg swelling.  Gastrointestinal: Negative for abdominal pain, blood in stool, constipation, diarrhea and nausea.       No gerd  Genitourinary: Negative for dysuria and hematuria.  Musculoskeletal: Negative for arthralgias and back pain.  Skin: Negative for color change and rash.  Neurological: Negative for light-headedness and headaches.  Psychiatric/Behavioral: Negative for dysphoric mood. The patient is not nervous/anxious.        Objective:   Vitals:   11/17/18 0810  BP: 132/64  Pulse: 77  Resp: 16  Temp: 98.6 F (37 C)  SpO2: 97%   Filed Weights   11/17/18 0810  Weight: 157 lb 12.8 oz (71.6 kg)   Body mass index is 24.71 kg/m.  BP Readings from Last 3 Encounters:  11/17/18 132/64  02/26/18 122/60  02/25/18 119/65    Wt Readings from Last 3  Encounters:  11/17/18 157 lb 12.8 oz (  71.6 kg)  02/26/18 149 lb 12.8 oz (67.9 kg)  02/25/18 154 lb (69.9 kg)     Physical Exam Constitutional: She appears well-developed and well-nourished. No distress.  HENT:  Head: Normocephalic and atraumatic.  Right Ear: External ear normal. Normal ear canal and TM Left Ear: External ear normal.  Normal ear canal and TM Mouth/Throat: Oropharynx is clear and moist.  Eyes: Conjunctivae and EOM are normal.  Neck: Neck supple. No tracheal deviation present. No thyromegaly present.  No carotid bruit  Cardiovascular: Normal rate, regular rhythm and normal heart sounds.   No murmur heard.  No edema. Pulmonary/Chest: Effort normal and breath sounds normal. No respiratory distress. She has no wheezes. She has no rales.  Breast: deferred   Abdominal: Soft. She exhibits no distension. There is no tenderness.  Lymphadenopathy: She has no cervical adenopathy.  Skin: Skin is warm and dry. She is not diaphoretic.  Psychiatric: She has a normal mood and affect. Her behavior is normal.        Assessment & Plan:   Physical exam: Screening blood work ordered Immunizations   Discussed tdap, others up to date Colonoscopy   Up to date  Mammogram   Up to date  Gyn    Up to date - Dr Matthew Saras Dexa   Due - done via Dr Matthew Saras Eye exams   Up to date  Exercise   Walking for the past three weeks 6 days a week  Weight   Normal BMI Skin     No concerns Substance abuse   none  See Problem List for Assessment and Plan of chronic medical problems.   FU in one year

## 2018-11-16 NOTE — Patient Instructions (Addendum)
Tests ordered today. Your results will be released to MyChart (or called to you) after review, usually within 72hours after test completion. If any changes need to be made, you will be notified at that same time.  All other Health Maintenance issues reviewed.   All recommended immunizations and age-appropriate screenings are up-to-date or discussed.  No immunizations administered today.   Medications reviewed and updated.  Changes include :  none    Please followup in one year   Health Maintenance, Female Adopting a healthy lifestyle and getting preventive care can go a long way to promote health and wellness. Talk with your health care provider about what schedule of regular examinations is right for you. This is a good chance for you to check in with your provider about disease prevention and staying healthy. In between checkups, there are plenty of things you can do on your own. Experts have done a lot of research about which lifestyle changes and preventive measures are most likely to keep you healthy. Ask your health care provider for more information. Weight and diet Eat a healthy diet  Be sure to include plenty of vegetables, fruits, low-fat dairy products, and lean protein.  Do not eat a lot of foods high in solid fats, added sugars, or salt.  Get regular exercise. This is one of the most important things you can do for your health. ? Most adults should exercise for at least 150 minutes each week. The exercise should increase your heart rate and make you sweat (moderate-intensity exercise). ? Most adults should also do strengthening exercises at least twice a week. This is in addition to the moderate-intensity exercise. Maintain a healthy weight  Body mass index (BMI) is a measurement that can be used to identify possible weight problems. It estimates body fat based on height and weight. Your health care provider can help determine your BMI and help you achieve or maintain a  healthy weight.  For females 20 years of age and older: ? A BMI below 18.5 is considered underweight. ? A BMI of 18.5 to 24.9 is normal. ? A BMI of 25 to 29.9 is considered overweight. ? A BMI of 30 and above is considered obese. Watch levels of cholesterol and blood lipids  You should start having your blood tested for lipids and cholesterol at 71 years of age, then have this test every 5 years.  You may need to have your cholesterol levels checked more often if: ? Your lipid or cholesterol levels are high. ? You are older than 71 years of age. ? You are at high risk for heart disease. Cancer screening Lung Cancer  Lung cancer screening is recommended for adults 55-80 years old who are at high risk for lung cancer because of a history of smoking.  A yearly low-dose CT scan of the lungs is recommended for people who: ? Currently smoke. ? Have quit within the past 15 years. ? Have at least a 30-pack-year history of smoking. A pack year is smoking an average of one pack of cigarettes a day for 1 year.  Yearly screening should continue until it has been 15 years since you quit.  Yearly screening should stop if you develop a health problem that would prevent you from having lung cancer treatment. Breast Cancer  Practice breast self-awareness. This means understanding how your breasts normally appear and feel.  It also means doing regular breast self-exams. Let your health care provider know about any changes, no matter how small.    small.  If you are in your 20s or 30s, you should have a clinical breast exam (CBE) by a health care provider every 1-3 years as part of a regular health exam.  If you are 40 or older, have a CBE every year. Also consider having a breast X-ray (mammogram) every year.  If you have a family history of breast cancer, talk to your health care provider about genetic screening.  If you are at high risk for breast cancer, talk to your health care provider about having  an MRI and a mammogram every year.  Breast cancer gene (BRCA) assessment is recommended for women who have family members with BRCA-related cancers. BRCA-related cancers include: ? Breast. ? Ovarian. ? Tubal. ? Peritoneal cancers.  Results of the assessment will determine the need for genetic counseling and BRCA1 and BRCA2 testing. Cervical Cancer Your health care provider may recommend that you be screened regularly for cancer of the pelvic organs (ovaries, uterus, and vagina). This screening involves a pelvic examination, including checking for microscopic changes to the surface of your cervix (Pap test). You may be encouraged to have this screening done every 3 years, beginning at age 67.  For women ages 65-65, health care providers may recommend pelvic exams and Pap testing every 3 years, or they may recommend the Pap and pelvic exam, combined with testing for human papilloma virus (HPV), every 5 years. Some types of HPV increase your risk of cervical cancer. Testing for HPV may also be done on women of any age with unclear Pap test results.  Other health care providers may not recommend any screening for nonpregnant women who are considered low risk for pelvic cancer and who do not have symptoms. Ask your health care provider if a screening pelvic exam is right for you.  If you have had past treatment for cervical cancer or a condition that could lead to cancer, you need Pap tests and screening for cancer for at least 20 years after your treatment. If Pap tests have been discontinued, your risk factors (such as having a new sexual partner) need to be reassessed to determine if screening should resume. Some women have medical problems that increase the chance of getting cervical cancer. In these cases, your health care provider may recommend more frequent screening and Pap tests. Colorectal Cancer  This type of cancer can be detected and often prevented.  Routine colorectal cancer screening  usually begins at 71 years of age and continues through 71 years of age.  Your health care provider may recommend screening at an earlier age if you have risk factors for colon cancer.  Your health care provider may also recommend using home test kits to check for hidden blood in the stool.  A small camera at the end of a tube can be used to examine your colon directly (sigmoidoscopy or colonoscopy). This is done to check for the earliest forms of colorectal cancer.  Routine screening usually begins at age 32.  Direct examination of the colon should be repeated every 5-10 years through 71 years of age. However, you may need to be screened more often if early forms of precancerous polyps or small growths are found. Skin Cancer  Check your skin from head to toe regularly.  Tell your health care provider about any new moles or changes in moles, especially if there is a change in a mole's shape or color.  Also tell your health care provider if you have a mole that is larger  than the size of a pencil eraser.  Always use sunscreen. Apply sunscreen liberally and repeatedly throughout the day.  Protect yourself by wearing long sleeves, pants, a wide-brimmed hat, and sunglasses whenever you are outside. Heart disease, diabetes, and high blood pressure  High blood pressure causes heart disease and increases the risk of stroke. High blood pressure is more likely to develop in: ? People who have blood pressure in the high end of the normal range (130-139/85-89 mm Hg). ? People who are overweight or obese. ? People who are African American.  If you are 5-63 years of age, have your blood pressure checked every 3-5 years. If you are 86 years of age or older, have your blood pressure checked every year. You should have your blood pressure measured twice-once when you are at a hospital or clinic, and once when you are not at a hospital or clinic. Record the average of the two measurements. To check your  blood pressure when you are not at a hospital or clinic, you can use: ? An automated blood pressure machine at a pharmacy. ? A home blood pressure monitor.  If you are between 39 years and 57 years old, ask your health care provider if you should take aspirin to prevent strokes.  Have regular diabetes screenings. This involves taking a blood sample to check your fasting blood sugar level. ? If you are at a normal weight and have a low risk for diabetes, have this test once every three years after 71 years of age. ? If you are overweight and have a high risk for diabetes, consider being tested at a younger age or more often. Preventing infection Hepatitis B  If you have a higher risk for hepatitis B, you should be screened for this virus. You are considered at high risk for hepatitis B if: ? You were born in a country where hepatitis B is common. Ask your health care provider which countries are considered high risk. ? Your parents were born in a high-risk country, and you have not been immunized against hepatitis B (hepatitis B vaccine). ? You have HIV or AIDS. ? You use needles to inject street drugs. ? You live with someone who has hepatitis B. ? You have had sex with someone who has hepatitis B. ? You get hemodialysis treatment. ? You take certain medicines for conditions, including cancer, organ transplantation, and autoimmune conditions. Hepatitis C  Blood testing is recommended for: ? Everyone born from [birth year] through 1963. ? Anyone with known risk factors for hepatitis C. Sexually transmitted infections (STIs)  You should be screened for sexually transmitted infections (STIs) including gonorrhea and chlamydia if: ? You are sexually active and are younger than 71 years of age. ? You are older than 71 years of age and your health care provider tells you that you are at risk for this type of infection. ? Your sexual activity has changed since you were last screened and you are at an  increased risk for chlamydia or gonorrhea. Ask your health care provider if you are at risk.  If you do not have HIV, but are at risk, it may be recommended that you take a prescription medicine daily to prevent HIV infection. This is called pre-exposure prophylaxis (PrEP). You are considered at risk if: ? You are sexually active and do not regularly use condoms or know the HIV status of your partner(s). ? You take drugs by injection. ? You are sexually active with a partner who  has HIV. Talk with your health care provider about whether you are at high risk of being infected with HIV. If you choose to begin PrEP, you should first be tested for HIV. You should then be tested every 3 months for as long as you are taking PrEP. Pregnancy  If you are premenopausal and you may become pregnant, ask your health care provider about preconception counseling.  If you may become pregnant, take 400 to 800 micrograms (mcg) of folic acid every day.  If you want to prevent pregnancy, talk to your health care provider about birth control (contraception). Osteoporosis and menopause  Osteoporosis is a disease in which the bones lose minerals and strength with aging. This can result in serious bone fractures. Your risk for osteoporosis can be identified using a bone density scan.  If you are 24 years of age or older, or if you are at risk for osteoporosis and fractures, ask your health care provider if you should be screened.  Ask your health care provider whether you should take a calcium or vitamin D supplement to lower your risk for osteoporosis.  Menopause may have certain physical symptoms and risks.  Hormone replacement therapy may reduce some of these symptoms and risks. Talk to your health care provider about whether hormone replacement therapy is right for you. Follow these instructions at home:  Schedule regular health, dental, and eye exams.  Stay current with your immunizations.  Do not use  any tobacco products including cigarettes, chewing tobacco, or electronic cigarettes.  If you are pregnant, do not drink alcohol.  If you are breastfeeding, limit how much and how often you drink alcohol.  Limit alcohol intake to no more than 1 drink per day for nonpregnant women. One drink equals 12 ounces of beer, 5 ounces of wine, or 1 ounces of hard liquor.  Do not use street drugs.  Do not share needles.  Ask your health care provider for help if you need support or information about quitting drugs.  Tell your health care provider if you often feel depressed.  Tell your health care provider if you have ever been abused or do not feel safe at home. This information is not intended to replace advice given to you by your health care provider. Make sure you discuss any questions you have with your health care provider. Document Released: 12/10/2010 Document Revised: 11/02/2015 Document Reviewed: 02/28/2015 Elsevier Interactive Patient Education  2019 [employer].

## 2018-11-16 NOTE — Assessment & Plan Note (Addendum)
Diet controlled A1c, urine micro Continue healthy diet and regular exercise

## 2018-11-17 ENCOUNTER — Other Ambulatory Visit: Payer: Self-pay

## 2018-11-17 ENCOUNTER — Ambulatory Visit (INDEPENDENT_AMBULATORY_CARE_PROVIDER_SITE_OTHER): Payer: PPO | Admitting: Internal Medicine

## 2018-11-17 ENCOUNTER — Encounter: Payer: Self-pay | Admitting: Internal Medicine

## 2018-11-17 ENCOUNTER — Other Ambulatory Visit (INDEPENDENT_AMBULATORY_CARE_PROVIDER_SITE_OTHER): Payer: PPO

## 2018-11-17 VITALS — BP 132/64 | HR 77 | Temp 98.6°F | Resp 16 | Ht 67.0 in | Wt 157.8 lb

## 2018-11-17 DIAGNOSIS — E782 Mixed hyperlipidemia: Secondary | ICD-10-CM | POA: Diagnosis not present

## 2018-11-17 DIAGNOSIS — E119 Type 2 diabetes mellitus without complications: Secondary | ICD-10-CM

## 2018-11-17 DIAGNOSIS — Z0000 Encounter for general adult medical examination without abnormal findings: Secondary | ICD-10-CM | POA: Diagnosis not present

## 2018-11-17 DIAGNOSIS — H269 Unspecified cataract: Secondary | ICD-10-CM | POA: Insufficient documentation

## 2018-11-17 DIAGNOSIS — M8580 Other specified disorders of bone density and structure, unspecified site: Secondary | ICD-10-CM

## 2018-11-17 LAB — LIPID PANEL
Cholesterol: 159 mg/dL (ref 0–200)
HDL: 55.2 mg/dL (ref >39.00)
LDL Cholesterol: 85 mg/dL (ref 0–99)
NonHDL: 104.15
Total CHOL/HDL Ratio: 3
Triglycerides: 95 mg/dL (ref 0.0–149.0)
VLDL: 19 mg/dL (ref 0.0–40.0)

## 2018-11-17 LAB — COMPREHENSIVE METABOLIC PANEL
ALT: 18 U/L (ref 0–35)
AST: 23 U/L (ref 0–37)
Albumin: 4.6 g/dL (ref 3.5–5.2)
Alkaline Phosphatase: 67 U/L (ref 39–117)
BUN: 13 mg/dL (ref 6–23)
CO2: 28 mEq/L (ref 19–32)
Calcium: 10 mg/dL (ref 8.4–10.5)
Chloride: 101 mEq/L (ref 96–112)
Creatinine, Ser: 0.88 mg/dL (ref 0.40–1.20)
GFR: 63.4 mL/min (ref >60.00)
Glucose, Bld: 150 mg/dL — ABNORMAL HIGH (ref 70–99)
Potassium: 4.1 mEq/L (ref 3.5–5.1)
Sodium: 137 mEq/L (ref 135–145)
Total Bilirubin: 0.5 mg/dL (ref 0.2–1.2)
Total Protein: 7.5 g/dL (ref 6.0–8.3)

## 2018-11-17 LAB — TSH: TSH: 0.84 u[IU]/mL (ref 0.35–4.50)

## 2018-11-17 LAB — MICROALBUMIN / CREATININE URINE RATIO
Creatinine,U: 103.3 mg/dL
Microalb Creat Ratio: 0.7 mg/g (ref 0.0–30.0)
Microalb, Ur: 0.7 mg/dL (ref 0.0–1.9)

## 2018-11-17 LAB — CBC WITH DIFFERENTIAL/PLATELET
Basophils Absolute: 0 10*3/uL (ref 0.0–0.1)
Basophils Relative: 0.5 % (ref 0.0–3.0)
Eosinophils Absolute: 0.1 10*3/uL (ref 0.0–0.7)
Eosinophils Relative: 1.3 % (ref 0.0–5.0)
HCT: 40.3 % (ref 36.0–46.0)
Hemoglobin: 13.8 g/dL (ref 12.0–15.0)
Lymphocytes Relative: 33.2 % (ref 12.0–46.0)
Lymphs Abs: 2.7 10*3/uL (ref 0.7–4.0)
MCHC: 34.1 g/dL (ref 30.0–36.0)
MCV: 87.4 fl (ref 78.0–100.0)
Monocytes Absolute: 0.5 10*3/uL (ref 0.1–1.0)
Monocytes Relative: 6.2 % (ref 3.0–12.0)
Neutro Abs: 4.8 10*3/uL (ref 1.4–7.7)
Neutrophils Relative %: 58.8 % (ref 43.0–77.0)
Platelets: 196 10*3/uL (ref 150.0–400.0)
RBC: 4.61 Mil/uL (ref 3.87–5.11)
RDW: 13.6 % (ref 11.5–15.5)
WBC: 8.2 10*3/uL (ref 4.0–10.5)

## 2018-11-17 LAB — HEMOGLOBIN A1C: Hgb A1c MFr Bld: 7 % — ABNORMAL HIGH (ref 4.6–6.5)

## 2018-11-17 NOTE — Assessment & Plan Note (Signed)
dexa via gyn Calcium and vitamin d Walking for exercise

## 2018-11-17 NOTE — Assessment & Plan Note (Signed)
Diet controlled Check lipids, tsh, cmp

## 2018-11-18 ENCOUNTER — Other Ambulatory Visit: Payer: Self-pay | Admitting: Internal Medicine

## 2018-11-18 ENCOUNTER — Encounter: Payer: Self-pay | Admitting: Internal Medicine

## 2018-11-18 DIAGNOSIS — E119 Type 2 diabetes mellitus without complications: Secondary | ICD-10-CM

## 2018-12-13 DIAGNOSIS — H1045 Other chronic allergic conjunctivitis: Secondary | ICD-10-CM | POA: Diagnosis not present

## 2018-12-22 ENCOUNTER — Encounter: Payer: Self-pay | Admitting: Internal Medicine

## 2018-12-23 ENCOUNTER — Encounter: Payer: Self-pay | Admitting: Internal Medicine

## 2018-12-24 DIAGNOSIS — L819 Disorder of pigmentation, unspecified: Secondary | ICD-10-CM | POA: Diagnosis not present

## 2018-12-24 DIAGNOSIS — L570 Actinic keratosis: Secondary | ICD-10-CM | POA: Diagnosis not present

## 2018-12-31 ENCOUNTER — Encounter: Payer: Self-pay | Admitting: Internal Medicine

## 2019-03-04 ENCOUNTER — Other Ambulatory Visit: Payer: Self-pay

## 2019-03-04 ENCOUNTER — Ambulatory Visit (INDEPENDENT_AMBULATORY_CARE_PROVIDER_SITE_OTHER): Payer: PPO

## 2019-03-04 DIAGNOSIS — Z23 Encounter for immunization: Secondary | ICD-10-CM

## 2019-03-08 ENCOUNTER — Encounter: Payer: Self-pay | Admitting: Internal Medicine

## 2019-03-08 ENCOUNTER — Other Ambulatory Visit (INDEPENDENT_AMBULATORY_CARE_PROVIDER_SITE_OTHER): Payer: PPO

## 2019-03-08 DIAGNOSIS — E119 Type 2 diabetes mellitus without complications: Secondary | ICD-10-CM

## 2019-03-08 LAB — HEMOGLOBIN A1C: Hgb A1c MFr Bld: 6 % (ref 4.6–6.5)

## 2019-04-28 ENCOUNTER — Telehealth: Payer: Self-pay

## 2019-04-28 NOTE — Telephone Encounter (Signed)
Set up a facetime tomorrow at 11:15. FYI

## 2019-04-28 NOTE — Progress Notes (Signed)
Virtual Visit via Video Note  I connected with Lisa Mcgee on 04/29/19 at 11:15 AM EST by a video enabled telemedicine application and verified that I am speaking with the correct person using two identifiers.   I discussed the limitations of evaluation and management by telemedicine and the availability of in person appointments. The patient expressed understanding and agreed to proceed.  Present for the visit:  Myself, Dr Billey Gosling, [facility].  The patient is currently at home and I am in the office.    No referring provider.    History of Present Illness: She is here for an acute visit for cold symptoms.   Her symptoms started over a week ago.    She is experiencing subjective fevers, chills, ear pressure/clogged feeling, nasal congestion, runny nose, sinus pain, sinus headaches, and sore throat.  She denies cough, wheeze, SOB and GI symptoms.  She denies risk of having COVID - this is typical of her sinus infections.   She has tried taking  Mucinex, tylenol, advil.      Review of Systems  Constitutional: Positive for chills and fever (subjective).  HENT: Positive for congestion, ear pain (pressure in ears), sinus pain and sore throat.        Sneezing, rhinorrhea  Respiratory: Negative for cough, shortness of breath and wheezing.   Gastrointestinal: Negative for diarrhea and nausea.  Neurological: Positive for headaches.     Social History   Socioeconomic History  . Marital status: Married    Spouse name: Not on file  . Number of children: Not on file  . Years of education: Not on file  . Highest education level: Not on file  Occupational History  . Not on file  Social Needs  . Financial resource strain: Not on file  . Food insecurity    Worry: Not on file    Inability: Not on file  . Transportation needs    Medical: Not on file    Non-medical: Not on file  Tobacco Use  . Smoking status: Never Smoker  . Smokeless tobacco: Never Used  Substance and Sexual  Activity  . Alcohol use: No  . Drug use: No  . Sexual activity: Not on file  Lifestyle  . Physical activity    Days per week: Not on file    Minutes per session: Not on file  . Stress: Not on file  Relationships  . Social Herbalist on phone: Not on file    Gets together: Not on file    Attends religious service: Not on file    Active member of club or organization: Not on file    Attends meetings of clubs or organizations: Not on file    Relationship status: Not on file  Other Topics Concern  . Not on file  Social History Narrative   Exercise:  Water aerobics twice a week     Observations/Objective: Appears well in NAD Breathing normally Skin is warm and dry  Assessment and Plan:  See Problem List for Assessment and Plan of chronic medical problems.   Follow Up Instructions:    I discussed the assessment and treatment plan with the patient. The patient was provided an opportunity to ask questions and all were answered. The patient agreed with the plan and demonstrated an understanding of the instructions.   The patient was advised to call back or seek an in-person evaluation if the symptoms worsen or if the condition fails to improve as anticipated.  Binnie Rail, MD

## 2019-04-28 NOTE — Telephone Encounter (Signed)
Copied from [place] [id number]. Topic: General - Inquiry >> Apr 28, 2019 11:32 AM Mathis Bud wrote: Reason for CRM: Patient states she is having seasonal allergies, and is requesting PCP to call her in something for allergies. Patient states she cannot have :Doxycycline hyclate 100mg  Call back [facility] ([place]), [place] - [phone] [phone] (Phone) [phone] (Fax)

## 2019-04-29 ENCOUNTER — Ambulatory Visit (INDEPENDENT_AMBULATORY_CARE_PROVIDER_SITE_OTHER): Payer: PPO | Admitting: Internal Medicine

## 2019-04-29 ENCOUNTER — Encounter: Payer: Self-pay | Admitting: Internal Medicine

## 2019-04-29 ENCOUNTER — Other Ambulatory Visit: Payer: Self-pay

## 2019-04-29 DIAGNOSIS — J0100 Acute maxillary sinusitis, unspecified: Secondary | ICD-10-CM

## 2019-04-29 MED ORDER — AMOXICILLIN 500 MG PO CAPS: 500.0000 mg | ORAL_CAPSULE | Freq: Three times a day (TID) | ORAL | 0 refills | Status: DC

## 2019-04-29 NOTE — Assessment & Plan Note (Signed)
Likely bacterial  Start amoxicillin Continue allergy medications, otc cold medications Rest, fluid Call if no improvement

## 2019-06-17 DIAGNOSIS — E119 Type 2 diabetes mellitus without complications: Secondary | ICD-10-CM | POA: Diagnosis not present

## 2019-06-17 LAB — HM DIABETES EYE EXAM

## 2019-06-23 ENCOUNTER — Encounter: Payer: Self-pay | Admitting: Internal Medicine

## 2019-07-02 ENCOUNTER — Ambulatory Visit: Payer: PPO | Attending: Internal Medicine

## 2019-07-02 DIAGNOSIS — Z20822 Contact with and (suspected) exposure to covid-19: Secondary | ICD-10-CM | POA: Diagnosis not present

## 2019-07-04 LAB — NOVEL CORONAVIRUS, NAA: SARS-CoV-2, NAA: NOT DETECTED

## 2019-07-05 DIAGNOSIS — Z01419 Encounter for gynecological examination (general) (routine) without abnormal findings: Secondary | ICD-10-CM | POA: Diagnosis not present

## 2019-07-05 DIAGNOSIS — Z6822 Body mass index (BMI) 22.0-22.9, adult: Secondary | ICD-10-CM | POA: Diagnosis not present

## 2019-07-05 DIAGNOSIS — N958 Other specified menopausal and perimenopausal disorders: Secondary | ICD-10-CM | POA: Diagnosis not present

## 2019-07-05 DIAGNOSIS — Z1231 Encounter for screening mammogram for malignant neoplasm of breast: Secondary | ICD-10-CM | POA: Diagnosis not present

## 2019-07-05 DIAGNOSIS — M8588 Other specified disorders of bone density and structure, other site: Secondary | ICD-10-CM | POA: Diagnosis not present

## 2019-07-05 LAB — HM DEXA SCAN

## 2019-11-17 NOTE — Patient Instructions (Addendum)
Blood work was ordered.    All other Health Maintenance issues reviewed.   All recommended immunizations and age-appropriate screenings are up-to-date or discussed.  No immunization administered today.   Medications reviewed and updated.  Changes include :   none     Please followup in 1 year    Health Maintenance, Female Adopting a healthy lifestyle and getting preventive care are important in promoting health and wellness. Ask your health care provider about:  The right schedule for you to have regular tests and exams.  Things you can do on your own to prevent diseases and keep yourself healthy. What should I know about diet, weight, and exercise? Eat a healthy diet   Eat a diet that includes plenty of vegetables, fruits, low-fat dairy products, and lean protein.  Do not eat a lot of foods that are high in solid fats, added sugars, or sodium. Maintain a healthy weight Body mass index (BMI) is used to identify weight problems. It estimates body fat based on height and weight. Your health care provider can help determine your BMI and help you achieve or maintain a healthy weight. Get regular exercise Get regular exercise. This is one of the most important things you can do for your health. Most adults should:  Exercise for at least 150 minutes each week. The exercise should increase your heart rate and make you sweat (moderate-intensity exercise).  Do strengthening exercises at least twice a week. This is in addition to the moderate-intensity exercise.  Spend less time sitting. Even light physical activity can be beneficial. Watch cholesterol and blood lipids Have your blood tested for lipids and cholesterol at 72 years of age, then have this test every 5 years. Have your cholesterol levels checked more often if:  Your lipid or cholesterol levels are high.  You are older than 72 years of age.  You are at high risk for heart disease. What should I know about cancer  screening? Depending on your health history and family history, you may need to have cancer screening at various ages. This may include screening for:  Breast cancer.  Cervical cancer.  Colorectal cancer.  Skin cancer.  Lung cancer. What should I know about heart disease, diabetes, and high blood pressure? Blood pressure and heart disease  High blood pressure causes heart disease and increases the risk of stroke. This is more likely to develop in people who have high blood pressure readings, are of African descent, or are overweight.  Have your blood pressure checked: ? Every 3-5 years if you are 18-39 years of age. ? Every year if you are 40 years old or older. Diabetes Have regular diabetes screenings. This checks your fasting blood sugar level. Have the screening done:  Once every three years after age 40 if you are at a normal weight and have a low risk for diabetes.  More often and at a younger age if you are overweight or have a high risk for diabetes. What should I know about preventing infection? Hepatitis B If you have a higher risk for hepatitis B, you should be screened for this virus. Talk with your health care provider to find out if you are at risk for hepatitis B infection. Hepatitis C Testing is recommended for:  Everyone born from 1945 through 1965.  Anyone with known risk factors for hepatitis C. Sexually transmitted infections (STIs)  Get screened for STIs, including gonorrhea and chlamydia, if: ? You are sexually active and are younger than 72 years   of age. ? You are older than 72 years of age and your health care provider tells you that you are at risk for this type of infection. ? Your sexual activity has changed since you were last screened, and you are at increased risk for chlamydia or gonorrhea. Ask your health care provider if you are at risk.  Ask your health care provider about whether you are at high risk for HIV. Your health care provider may  recommend a prescription medicine to help prevent HIV infection. If you choose to take medicine to prevent HIV, you should first get tested for HIV. You should then be tested every 3 months for as long as you are taking the medicine. Pregnancy  If you are about to stop having your period (premenopausal) and you may become pregnant, seek counseling before you get pregnant.  Take 400 to 800 micrograms (mcg) of folic acid every day if you become pregnant.  Ask for birth control (contraception) if you want to prevent pregnancy. Osteoporosis and menopause Osteoporosis is a disease in which the bones lose minerals and strength with aging. This can result in bone fractures. If you are 65 years old or older, or if you are at risk for osteoporosis and fractures, ask your health care provider if you should:  Be screened for bone loss.  Take a calcium or vitamin D supplement to lower your risk of fractures.  Be given hormone replacement therapy (HRT) to treat symptoms of menopause. Follow these instructions at home: Lifestyle  Do not use any products that contain nicotine or tobacco, such as cigarettes, e-cigarettes, and chewing tobacco. If you need help quitting, ask your health care provider.  Do not use street drugs.  Do not share needles.  Ask your health care provider for help if you need support or information about quitting drugs. Alcohol use  Do not drink alcohol if: ? Your health care provider tells you not to drink. ? You are pregnant, may be pregnant, or are planning to become pregnant.  If you drink alcohol: ? Limit how much you use to 0-1 drink a day. ? Limit intake if you are breastfeeding.  Be aware of how much alcohol is in your drink. In the [place] one drink equals one 12 oz bottle of beer (355 mL), one 5 oz glass of wine (148 mL), or one 1 oz glass of hard liquor (44 mL). General instructions  Schedule regular health, dental, and eye exams.  Stay current with your  vaccines.  Tell your health care provider if: ? You often feel depressed. ? You have ever been abused or do not feel safe at home. Summary  Adopting a healthy lifestyle and getting preventive care are important in promoting health and wellness.  Follow your health care provider's instructions about healthy diet, exercising, and getting tested or screened for diseases.  Follow your health care provider's instructions on monitoring your cholesterol and blood pressure. This information is not intended to replace advice given to you by your health care provider. Make sure you discuss any questions you have with your health care provider. Document Revised: 05/20/2018 Document Reviewed: 05/20/2018 Elsevier Patient Education  2020 [employer]  

## 2019-11-17 NOTE — Progress Notes (Signed)
Subjective:    Patient ID: Lisa Mcgee, female    DOB: [birth year], 74 y.o.   MRN: [id number]  HPI She is here for a physical exam.   She is exercising regularly.  She is eating well.  She feels well.    Medications and allergies reviewed with patient and updated if appropriate.  Patient Active Problem List   Diagnosis Date Noted  . Cataract 11/17/2018  . Asymmetrical hearing loss of left ear 04/21/2018  . Tinnitus of left ear 04/21/2018  . Allergic rhinitis 03/14/2016  . Pes cavus 04/11/2014  . Vitamin D deficiency 08/04/2013  . Osteopenia 04/17/2013  . Rhinitis, non-allergic 05/26/2011  . SYNCOPE 06/28/2008  . Palpitations 06/28/2008  . Hyperlipidemia 04/20/2008  . History of colonic polyps 04/20/2008  . Diabetes type 2, controlled ([place]) 05/28/2007    Current Outpatient Medications on File Prior to Visit  Medication Sig Dispense Refill  . Calcium Carbonate-Vitamin D (CALCIUM 600 + D PO) Take 1 tablet by mouth 2 (two) times daily.     . Cholecalciferol (VITAMIN D3) 2000 UNITS TABS Take 1 tablet by mouth 2 (two) times daily.     . clobetasol cream (TEMOVATE) 4.09 % Apply 1 application topically as needed.    . COD LIVER OIL PO Take by mouth.    . Coenzyme Q10 (COQ10 PO) Take 300 mg by mouth daily.      . fish oil-omega-3 fatty acids 1000 MG capsule Take 2 g by mouth daily.      . Flaxseed, Linseed, (FLAX SEED OIL) 1000 MG CAPS Take 1 capsule by mouth 2 (two) times daily.     . Garlic 811 MG TABS Take 1,000 mg by mouth 2 (two) times daily.     Marland Kitchen ibuprofen (ADVIL,MOTRIN) 200 MG tablet Take 200 mg by mouth. Take 3 pills prn    . ketoconazole (NIZORAL) 2 % cream Apply 1 application topically 2 (two) times daily as needed for irritation. 15 g 0  . Multiple Vitamins-Minerals (HAIR/SKIN/NAILS PO) Take 1 tablet by mouth daily.     . mupirocin ointment (BACTROBAN) 2 % Apply 1 application topically as needed.    . NON FORMULARY Enhanced Energy MVI-Take 1 pill bid    .  pyridOXINE (VITAMIN B-6) 100 MG tablet Take 100 mg by mouth 2 (two) times daily.     Dwyane Dee SILVER GEL Apply topically.    . triamcinolone cream (KENALOG) 0.1 % Apply 1 application topically as needed.    . vitamin B-12 (CYANOCOBALAMIN) 1000 MCG tablet Take 1,000 mcg by mouth daily.      Marland Kitchen zinc gluconate 50 MG tablet Take 50 mg by mouth 2 (two) times daily.     No current facility-administered medications on file prior to visit.    Past Medical History:  Diagnosis Date  . Allergy   . Arthritis    bil fingers  . Cataract    bil cataracts  . Diabetes mellitus without complication (HCC)    diet controlled  . Environmental allergies   . Ganglion cyst of left foot 06/22/2013  . Hyperlipidemia    low HDL  . Osteopenia     Past Surgical History:  Procedure Laterality Date  . COLONOSCOPY  2007   Adenomatous polyp  . COLONOSCOPY  2014   neg  . GANGLION CYST EXCISION Left    left foot  . POLYPECTOMY    . TONSILLECTOMY AND ADENOIDECTOMY  age 74  . TRIGGER FINGER RELEASE Left 08/15/2016  Social History   Socioeconomic History  . Marital status: Married    Spouse name: Not on file  . Number of children: Not on file  . Years of education: Not on file  . Highest education level: Not on file  Occupational History  . Not on file  Tobacco Use  . Smoking status: Never Smoker  . Smokeless tobacco: Never Used  Vaping Use  . Vaping Use: Never used  Substance and Sexual Activity  . Alcohol use: No  . Drug use: No  . Sexual activity: Not on file  Other Topics Concern  . Not on file  Social History Narrative   Exercise:  Water aerobics twice a week   Social Determinants of Health   Financial Resource Strain:   . Difficulty of Paying Living Expenses:   Food Insecurity:   . Worried About Charity fundraiser in the Last Year:   . Arboriculturist in the Last Year:   Transportation Needs:   . Film/video editor (Medical):   Marland Kitchen Lack of Transportation (Non-Medical):     Physical Activity:   . Days of Exercise per Week:   . Minutes of Exercise per Session:   Stress:   . Feeling of Stress :   Social Connections:   . Frequency of Communication with Friends and Family:   . Frequency of Social Gatherings with Friends and Family:   . Attends Religious Services:   . Active Member of Clubs or Organizations:   . Attends Archivist Meetings:   Marland Kitchen Marital Status:     Family History  Problem Relation Age of Onset  . Allergies Mother   . Diabetes Mother   . Heart attack Father        > 52  . Diabetes Sister   . Alzheimer's disease Brother   . Colon cancer Maternal Grandfather   . Colitis Maternal Grandfather        died at 34 years old  . Stroke Neg Hx   . Cancer Neg Hx   . Esophageal cancer Neg Hx   . Rectal cancer Neg Hx   . Stomach cancer Neg Hx     Review of Systems  Constitutional: Negative for chills and fever.  Eyes: Negative for visual disturbance.  Respiratory: Negative for cough, shortness of breath and wheezing.   Cardiovascular: Negative for chest pain, palpitations and leg swelling.  Gastrointestinal: Negative for abdominal pain, blood in stool, constipation, diarrhea and nausea.  Genitourinary: Negative for dysuria and hematuria.  Musculoskeletal: Negative for arthralgias and back pain.  Skin: Negative for color change and rash.  Neurological: Positive for headaches (rare). Negative for light-headedness.  Psychiatric/Behavioral: Negative for dysphoric mood. The patient is not nervous/anxious.        Objective:   Vitals:   11/18/19 0753  BP: 128/74  Pulse: 80  Temp: 98 F (36.7 C)  SpO2: 99%   Filed Weights   11/18/19 0753  Weight: 141 lb 12.8 oz (64.3 kg)   Body mass index is 22.21 kg/m.  BP Readings from Last 3 Encounters:  11/18/19 128/74  11/17/18 132/64  02/26/18 122/60    Wt Readings from Last 3 Encounters:  11/18/19 141 lb 12.8 oz (64.3 kg)  11/17/18 157 lb 12.8 oz (71.6 kg)  02/26/18 149 lb  12.8 oz (67.9 kg)     Physical Exam Constitutional: She appears well-developed and well-nourished. No distress.  HENT:  Head: Normocephalic and atraumatic.  Right Ear: External ear normal.  Normal ear canal and TM Left Ear: External ear normal.  Normal ear canal and TM Mouth/Throat: Oropharynx is clear and moist.  Eyes: Conjunctivae and EOM are normal.  Neck: Neck supple. No tracheal deviation present. No thyromegaly present.  No carotid bruit  Cardiovascular: Normal rate, regular rhythm and normal heart sounds.   No murmur heard.  No edema. Pulmonary/Chest: Effort normal and breath sounds normal. No respiratory distress. She has no wheezes. She has no rales.  Breast: deferred   Abdominal: Soft. She exhibits no distension. There is no tenderness.  Lymphadenopathy: She has no cervical adenopathy.  Skin: Skin is warm and dry. She is not diaphoretic.  Psychiatric: She has a normal mood and affect. Her behavior is normal.        Assessment & Plan:   Physical exam: Screening blood work    ordered Immunizations  deferred Covid, others up to date Colonoscopy  Up to date  Mammogram  Up to date  Gyn  Up to date  - Dr Matthew Saras Dexa  Up to date - gyn manages Eye exams  Up to date  Exercise   Walking 4.5-5 miles a day, 6 days a week Weight  Normal BMI Substance abuse   None Sees derm regularly  See Problem List for Assessment and Plan of chronic medical problems.     This visit occurred during the SARS-CoV-2 public health emergency.  Safety protocols were in place, including screening questions prior to the visit, additional usage of staff PPE, and extensive cleaning of exam room while observing appropriate contact time as indicated for disinfecting solutions.

## 2019-11-18 ENCOUNTER — Other Ambulatory Visit: Payer: Self-pay

## 2019-11-18 ENCOUNTER — Encounter: Payer: Self-pay | Admitting: Internal Medicine

## 2019-11-18 ENCOUNTER — Ambulatory Visit (INDEPENDENT_AMBULATORY_CARE_PROVIDER_SITE_OTHER): Payer: PPO | Admitting: Internal Medicine

## 2019-11-18 VITALS — BP 128/74 | HR 80 | Temp 98.0°F | Ht 67.0 in | Wt 141.8 lb

## 2019-11-18 DIAGNOSIS — Z0000 Encounter for general adult medical examination without abnormal findings: Secondary | ICD-10-CM

## 2019-11-18 DIAGNOSIS — E782 Mixed hyperlipidemia: Secondary | ICD-10-CM | POA: Diagnosis not present

## 2019-11-18 DIAGNOSIS — M8580 Other specified disorders of bone density and structure, unspecified site: Secondary | ICD-10-CM | POA: Diagnosis not present

## 2019-11-18 DIAGNOSIS — E119 Type 2 diabetes mellitus without complications: Secondary | ICD-10-CM

## 2019-11-18 LAB — CBC WITH DIFFERENTIAL/PLATELET
Basophils Absolute: 0 10*3/uL (ref 0.0–0.1)
Basophils Relative: 0.6 % (ref 0.0–3.0)
Eosinophils Absolute: 0.1 10*3/uL (ref 0.0–0.7)
Eosinophils Relative: 1.4 % (ref 0.0–5.0)
HCT: 38.4 % (ref 36.0–46.0)
Hemoglobin: 13.5 g/dL (ref 12.0–15.0)
Lymphocytes Relative: 35.8 % (ref 12.0–46.0)
Lymphs Abs: 2 10*3/uL (ref 0.7–4.0)
MCHC: 35.2 g/dL (ref 30.0–36.0)
MCV: 87.4 fl (ref 78.0–100.0)
Monocytes Absolute: 0.5 10*3/uL (ref 0.1–1.0)
Monocytes Relative: 9.5 % (ref 3.0–12.0)
Neutro Abs: 2.9 10*3/uL (ref 1.4–7.7)
Neutrophils Relative %: 52.7 % (ref 43.0–77.0)
Platelets: 181 10*3/uL (ref 150.0–400.0)
RBC: 4.4 Mil/uL (ref 3.87–5.11)
RDW: 13.2 % (ref 11.5–15.5)
WBC: 5.5 10*3/uL (ref 4.0–10.5)

## 2019-11-18 LAB — MICROALBUMIN / CREATININE URINE RATIO
Creatinine,U: 65.5 mg/dL
Microalb Creat Ratio: 1.1 mg/g (ref 0.0–30.0)
Microalb, Ur: <0.7 mg/dL (ref 0.0–1.9)

## 2019-11-18 LAB — LIPID PANEL
Cholesterol: 143 mg/dL (ref 0–200)
HDL: 61.9 mg/dL (ref >39.00)
LDL Cholesterol: 72 mg/dL (ref 0–99)
NonHDL: 81.31
Total CHOL/HDL Ratio: 2
Triglycerides: 45 mg/dL (ref 0.0–149.0)
VLDL: 9 mg/dL (ref 0.0–40.0)

## 2019-11-18 LAB — COMPREHENSIVE METABOLIC PANEL
ALT: 16 U/L (ref 0–35)
AST: 27 U/L (ref 0–37)
Albumin: 4.5 g/dL (ref 3.5–5.2)
Alkaline Phosphatase: 64 U/L (ref 39–117)
BUN: 14 mg/dL (ref 6–23)
CO2: 28 mEq/L (ref 19–32)
Calcium: 9.4 mg/dL (ref 8.4–10.5)
Chloride: 99 mEq/L (ref 96–112)
Creatinine, Ser: 0.83 mg/dL (ref 0.40–1.20)
GFR: 67.64 mL/min (ref >60.00)
Glucose, Bld: 130 mg/dL — ABNORMAL HIGH (ref 70–99)
Potassium: 4.5 mEq/L (ref 3.5–5.1)
Sodium: 133 mEq/L — ABNORMAL LOW (ref 135–145)
Total Bilirubin: 0.5 mg/dL (ref 0.2–1.2)
Total Protein: 7.4 g/dL (ref 6.0–8.3)

## 2019-11-18 LAB — TSH: TSH: 0.79 u[IU]/mL (ref 0.35–4.50)

## 2019-11-18 NOTE — Assessment & Plan Note (Signed)
Chronic dexa up to date Walks regularly Taking calcium and vitamin d

## 2019-11-18 NOTE — Assessment & Plan Note (Signed)
Chronic Diet controlled A1c, urine micro Continue diabetic diet, regular exercise

## 2019-11-18 NOTE — Assessment & Plan Note (Signed)
Diet controlled and sugars in prediabetic range except for once No need for statin Lipids, cmp

## 2019-11-19 LAB — HEMOGLOBIN A1C: Hgb A1c MFr Bld: 6 % (ref 4.6–6.5)

## 2019-12-02 ENCOUNTER — Encounter: Payer: Self-pay | Admitting: Internal Medicine

## 2019-12-02 NOTE — Progress Notes (Signed)
Outside notes received. Information abstracted. Notes sent to scan.  

## 2019-12-24 DIAGNOSIS — L570 Actinic keratosis: Secondary | ICD-10-CM | POA: Diagnosis not present

## 2020-01-21 DIAGNOSIS — R69 Illness, unspecified: Secondary | ICD-10-CM | POA: Diagnosis not present

## 2020-01-27 DIAGNOSIS — Z20828 Contact with and (suspected) exposure to other viral communicable diseases: Secondary | ICD-10-CM | POA: Diagnosis not present

## 2020-01-30 ENCOUNTER — Encounter: Payer: Self-pay | Admitting: Internal Medicine

## 2020-02-10 DIAGNOSIS — L249 Irritant contact dermatitis, unspecified cause: Secondary | ICD-10-CM | POA: Diagnosis not present

## 2020-02-14 ENCOUNTER — Encounter: Payer: Self-pay | Admitting: Internal Medicine

## 2020-02-15 MED ORDER — TRAZODONE HCL 50 MG PO TABS: 25.0000 mg | ORAL_TABLET | Freq: Every evening | ORAL | 3 refills | Status: DC | PRN

## 2020-02-16 ENCOUNTER — Encounter: Payer: Self-pay | Admitting: Internal Medicine

## 2020-02-17 MED ORDER — LORAZEPAM 0.5 MG PO TABS: 0.2500 mg | ORAL_TABLET | Freq: Every evening | ORAL | 1 refills | Status: DC | PRN

## 2020-04-05 ENCOUNTER — Other Ambulatory Visit: Payer: Self-pay

## 2020-04-05 ENCOUNTER — Ambulatory Visit (INDEPENDENT_AMBULATORY_CARE_PROVIDER_SITE_OTHER): Payer: PPO

## 2020-04-05 DIAGNOSIS — Z23 Encounter for immunization: Secondary | ICD-10-CM

## 2020-04-05 NOTE — Progress Notes (Signed)
High Dose Flu Vaccine given to pt w/o any complications.

## 2020-06-21 ENCOUNTER — Encounter: Payer: Self-pay | Admitting: Internal Medicine

## 2020-06-29 DIAGNOSIS — E119 Type 2 diabetes mellitus without complications: Secondary | ICD-10-CM | POA: Diagnosis not present

## 2020-06-29 LAB — HM DIABETES EYE EXAM

## 2020-07-11 ENCOUNTER — Encounter: Payer: Self-pay | Admitting: Internal Medicine

## 2020-07-11 NOTE — Progress Notes (Signed)
Outside notes received. Information abstracted. Notes sent to scan.  

## 2020-08-02 DIAGNOSIS — Z1231 Encounter for screening mammogram for malignant neoplasm of breast: Secondary | ICD-10-CM | POA: Diagnosis not present

## 2020-08-02 DIAGNOSIS — Z01419 Encounter for gynecological examination (general) (routine) without abnormal findings: Secondary | ICD-10-CM | POA: Diagnosis not present

## 2020-08-02 DIAGNOSIS — Z6822 Body mass index (BMI) 22.0-22.9, adult: Secondary | ICD-10-CM | POA: Diagnosis not present

## 2020-08-10 ENCOUNTER — Telehealth: Payer: Self-pay | Admitting: Internal Medicine

## 2020-08-10 MED ORDER — LORAZEPAM 0.5 MG PO TABS: 0.2500 mg | ORAL_TABLET | Freq: Every evening | ORAL | 1 refills | Status: DC | PRN

## 2020-08-10 NOTE — Telephone Encounter (Signed)
LORazepam (ATIVAN) 0.5 MG tablet [id number] - [place], [place] [address] Phone:  [phone]  Fax:  [phone]     Last seen- 06.10.21 Next apt- 06.13.22

## 2020-09-13 ENCOUNTER — Telehealth: Payer: Self-pay | Admitting: Internal Medicine

## 2020-09-13 NOTE — Progress Notes (Signed)
  Chronic Care Management   Note  09/13/2020 Name: Lisa Mcgee MRN: [id number] DOB: 07-04-1945  Lisa Mcgee is a 75 y.o. year old female who is a primary care patient of Burns, Claudina Lick, MD. I reached out to [place] by phone today in response to a referral sent by Lisa Mcgee's PCP, Binnie Rail, MD.   Lisa Mcgee was given information about Chronic Care Management services today including:  1. CCM service includes personalized support from designated clinical staff supervised by her physician, including individualized plan of care and coordination with other care providers 2. 24/7 contact phone numbers for assistance for urgent and routine care needs. 3. Service will only be billed when office clinical staff spend 20 minutes or more in a month to coordinate care. 4. Only one practitioner may furnish and bill the service in a calendar month. 5. The patient may stop CCM services at any time (effective at the end of the month) by phone call to the office staff.   Patient did not agree to enrollment in care management services and does not wish to consider at this time.  Follow up plan:   Lisa Mcgee [employer] Scheduler

## 2020-10-01 ENCOUNTER — Encounter: Payer: Self-pay | Admitting: Internal Medicine

## 2020-10-02 ENCOUNTER — Encounter: Payer: Self-pay | Admitting: Internal Medicine

## 2020-10-02 ENCOUNTER — Telehealth: Payer: Self-pay | Admitting: Internal Medicine

## 2020-10-02 ENCOUNTER — Telehealth (INDEPENDENT_AMBULATORY_CARE_PROVIDER_SITE_OTHER): Payer: PPO | Admitting: Internal Medicine

## 2020-10-02 DIAGNOSIS — R110 Nausea: Secondary | ICD-10-CM | POA: Insufficient documentation

## 2020-10-02 DIAGNOSIS — J0100 Acute maxillary sinusitis, unspecified: Secondary | ICD-10-CM

## 2020-10-02 MED ORDER — ONDANSETRON 4 MG PO TBDP: 4.0000 mg | ORAL_TABLET | Freq: Three times a day (TID) | ORAL | 0 refills | Status: DC | PRN

## 2020-10-02 MED ORDER — AMOXICILLIN 500 MG PO CAPS: 500.0000 mg | ORAL_CAPSULE | Freq: Three times a day (TID) | ORAL | 0 refills | Status: AC

## 2020-10-02 MED ORDER — AMOXICILLIN 500 MG PO CAPS: 500.0000 mg | ORAL_CAPSULE | Freq: Three times a day (TID) | ORAL | 0 refills | Status: DC

## 2020-10-02 NOTE — Telephone Encounter (Signed)
Prescriptions resent

## 2020-10-02 NOTE — Progress Notes (Signed)
Virtual Visit via telephone note, due to failed audio on the video visit  I connected with Lisa Mcgee on 10/02/20 at  8:50 AM EDT by telephone and verified that I am speaking with the correct person using two identifiers.   I discussed the limitations of evaluation and management by telemedicine and the availability of in person appointments. The patient expressed understanding and agreed to proceed.  Present for the visit:  Myself, Dr Billey Gosling, [facility].  The patient is currently at home and I am in the office.    No referring provider.    History of Present Illness: She is here for an acute visit for cold symptoms.   Her symptoms started last week  She is experiencing subjective fevers, chills, fatigue, nasal congestion, ear pain and pressure, sinus pain, headaches, productive cough at times, nausea and some vomiting.  She denies any shortness of breath, wheezing or body aches.  She has tried taking over-the-counter cough medication  She has been drinking a good amount of fluids, but not eating much because of the nausea.  She did vomit once.  She has a similar illness every spring and fall and feels it is related to exposure to pollen that continues to worsen after that.  This is typical for her.  Review of Systems  Constitutional: Positive for chills (feels hot/cold). Negative for fever.  HENT: Positive for congestion, ear pain (pain and pressure) and sinus pain. Negative for sore throat.   Respiratory: Positive for cough and sputum production. Negative for shortness of breath and wheezing.   Gastrointestinal: Positive for nausea and vomiting. Negative for diarrhea.  Musculoskeletal: Negative for myalgias.  Neurological: Positive for headaches. Negative for dizziness.      Social History   Socioeconomic History  . Marital status: Widowed    Spouse name: Not on file  . Number of children: Not on file  . Years of education: Not on file  . Highest education level:  Not on file  Occupational History  . Not on file  Tobacco Use  . Smoking status: Never Smoker  . Smokeless tobacco: Never Used  Vaping Use  . Vaping Use: Never used  Substance and Sexual Activity  . Alcohol use: No  . Drug use: No  . Sexual activity: Not on file  Other Topics Concern  . Not on file  Social History Narrative   Exercise:  Water aerobics twice a week   Social Determinants of Health   Financial Resource Strain: Not on file  Food Insecurity: Not on file  Transportation Needs: Not on file  Physical Activity: Not on file  Stress: Not on file  Social Connections: Not on file     Observations/Objective: I was able to see her briefly on video before we had to turn the visit into a telephone visit Appears well in NAD Breathing normally  Assessment and Plan:  See Problem List for Assessment and Plan of chronic medical problems.   Follow Up Instructions:    I discussed the assessment and treatment plan with the patient. The patient was provided an opportunity to ask questions and all were answered. The patient agreed with the plan and demonstrated an understanding of the instructions.   The patient was advised to call back or seek an in-person evaluation if the symptoms worsen or if the condition fails to improve as anticipated.  Time spent on telephone: 9 minutes  Binnie Rail, MD

## 2020-10-02 NOTE — Telephone Encounter (Signed)
Patient called and said that pharmacy did not receive amoxicillin (AMOXIL) 500 MG capsule and ondansetron (ZOFRAN ODT) 4 MG disintegrating tablet. She was wondering if they could be re sent to [facility], [address] AT [address]. Please advise

## 2020-10-02 NOTE — Assessment & Plan Note (Signed)
Acute Possibly related to sinus drainage Zofran 4 mg ODT every 8 hours as needed She is drinking a good amount of fluids Bland diet, small portions-increase as tolerated

## 2020-10-02 NOTE — Telephone Encounter (Signed)
[facility] FYI  Caller states coughing up yellow phelgm and nasal congestion. Taking otc Zyrtec and Mucinex. Sxs started yesterday out in the pollen Wed and Thursday. Has not taken temperature.Caller states she had called earlier and was given home care advice. She states her symptoms have worsened. States "I feel rotten". She is starting to have a headache and she has pressure behind her eyes. States she thinks she has a sinus infection as she gets them twice a year and feels it is due to the pollen. States she has a fever but states she doesn't check it with a thermometer but states she has been hot and cold. States she cannot take doxycycline.  Advised to go to ED now. Patient understood but preferred home care. Patient is scheduled to for OV with PCP.

## 2020-10-02 NOTE — Assessment & Plan Note (Signed)
Acute Start amoxicillin 500 mg 3 times daily  x 10 day otc cold medications, cough suppressants Rest, fluid Call if no improvement

## 2020-10-09 ENCOUNTER — Encounter: Payer: Self-pay | Admitting: Internal Medicine

## 2020-10-09 ENCOUNTER — Telehealth: Payer: Self-pay | Admitting: Internal Medicine

## 2020-10-09 NOTE — Telephone Encounter (Signed)
Called pt to schedule AWV with [facility]. Patient asked if I could call her next week to schedule.

## 2020-10-14 ENCOUNTER — Encounter: Payer: Self-pay | Admitting: Internal Medicine

## 2020-10-16 ENCOUNTER — Ambulatory Visit (INDEPENDENT_AMBULATORY_CARE_PROVIDER_SITE_OTHER): Payer: PPO | Admitting: Internal Medicine

## 2020-10-16 ENCOUNTER — Encounter: Payer: Self-pay | Admitting: Internal Medicine

## 2020-10-16 ENCOUNTER — Other Ambulatory Visit: Payer: Self-pay

## 2020-10-16 VITALS — BP 146/68 | HR 73 | Temp 98.2°F | Ht 67.0 in

## 2020-10-16 DIAGNOSIS — E559 Vitamin D deficiency, unspecified: Secondary | ICD-10-CM | POA: Diagnosis not present

## 2020-10-16 DIAGNOSIS — K5900 Constipation, unspecified: Secondary | ICD-10-CM | POA: Insufficient documentation

## 2020-10-16 DIAGNOSIS — R000 Tachycardia, unspecified: Secondary | ICD-10-CM

## 2020-10-16 DIAGNOSIS — R5383 Other fatigue: Secondary | ICD-10-CM | POA: Insufficient documentation

## 2020-10-16 DIAGNOSIS — D649 Anemia, unspecified: Secondary | ICD-10-CM

## 2020-10-16 DIAGNOSIS — E119 Type 2 diabetes mellitus without complications: Secondary | ICD-10-CM

## 2020-10-16 DIAGNOSIS — R030 Elevated blood-pressure reading, without diagnosis of hypertension: Secondary | ICD-10-CM

## 2020-10-16 LAB — COMPREHENSIVE METABOLIC PANEL
ALT: 14 U/L (ref 0–35)
AST: 20 U/L (ref 0–37)
Albumin: 4.4 g/dL (ref 3.5–5.2)
Alkaline Phosphatase: 76 U/L (ref 39–117)
BUN: 15 mg/dL (ref 6–23)
CO2: 23 mEq/L (ref 19–32)
Calcium: 9.5 mg/dL (ref 8.4–10.5)
Chloride: 91 mEq/L — ABNORMAL LOW (ref 96–112)
Creatinine, Ser: 0.82 mg/dL (ref 0.40–1.20)
GFR: 71.38 mL/min (ref >60.00)
Glucose, Bld: 123 mg/dL — ABNORMAL HIGH (ref 70–99)
Potassium: 4 mEq/L (ref 3.5–5.1)
Sodium: 126 mEq/L — ABNORMAL LOW (ref 135–145)
Total Bilirubin: 0.5 mg/dL (ref 0.2–1.2)
Total Protein: 7.2 g/dL (ref 6.0–8.3)

## 2020-10-16 LAB — CBC WITH DIFFERENTIAL/PLATELET
Basophils Absolute: 0 10*3/uL (ref 0.0–0.1)
Basophils Relative: 0.2 % (ref 0.0–3.0)
Eosinophils Absolute: 0 10*3/uL (ref 0.0–0.7)
Eosinophils Relative: 0.4 % (ref 0.0–5.0)
HCT: 37.5 % (ref 36.0–46.0)
Hemoglobin: 13.1 g/dL (ref 12.0–15.0)
Lymphocytes Relative: 14.6 % (ref 12.0–46.0)
Lymphs Abs: 1.5 10*3/uL (ref 0.7–4.0)
MCHC: 34.9 g/dL (ref 30.0–36.0)
MCV: 86.1 fl (ref 78.0–100.0)
Monocytes Absolute: 0.6 10*3/uL (ref 0.1–1.0)
Monocytes Relative: 5.4 % (ref 3.0–12.0)
Neutro Abs: 8.3 10*3/uL — ABNORMAL HIGH (ref 1.4–7.7)
Neutrophils Relative %: 79.4 % — ABNORMAL HIGH (ref 43.0–77.0)
Platelets: 247 10*3/uL (ref 150.0–400.0)
RBC: 4.36 Mil/uL (ref 3.87–5.11)
RDW: 12.9 % (ref 11.5–15.5)
WBC: 10.5 10*3/uL (ref 4.0–10.5)

## 2020-10-16 LAB — VITAMIN D 25 HYDROXY (VIT D DEFICIENCY, FRACTURES): VITD: 54.74 ng/mL (ref 30.00–100.00)

## 2020-10-16 LAB — HEMOGLOBIN A1C: Hgb A1c MFr Bld: 6.1 % (ref 4.6–6.5)

## 2020-10-16 LAB — TSH: TSH: 0.39 u[IU]/mL (ref 0.35–4.50)

## 2020-10-16 MED ORDER — LORAZEPAM 0.5 MG PO TABS: 0.2500 mg | ORAL_TABLET | Freq: Every evening | ORAL | 1 refills | Status: DC | PRN

## 2020-10-16 NOTE — Patient Instructions (Signed)
  Blood work was ordered.     Medications changes include :   Hold iron for now.  Start miralax twice day for now.   Continue stool softener 2 tabs daily.   You can add metamucil.  Continue increased water intake.

## 2020-10-16 NOTE — Progress Notes (Signed)
Subjective:    Patient ID: Lisa Mcgee, female    DOB: Jan 27, 1946, 75 y.o.   MRN: [id number]  HPI The patient is here for an acute visit.  I saw her virtually on 4/25 for a sinus infection.  She completed the antibiotics.  She feels those are gone.     Fatigue since early February.  She has feels cold on occasion, today she had chills.  Her eyes feel tired.  She does not understand why she is so tired.  She has had intermittent heart racing.  When asked about chest pain she says maybe, but she is not sure.  Chronic constipation - worse in past few months.   Taking stool softener.  She did see her gynecologist not long ago and her hemoglobin was slightly low at 11.8.  She did bring her hemoglobin result from her gynecologist.  She was concerned about this causing her fatigue.  She is not sleeping well, but this has been intermittent since her husband died last fall.  She has been taking the lorazepam as needed and that does help her get 6 hours.    Medications and allergies reviewed with patient and updated if appropriate.  Patient Active Problem List   Diagnosis Date Noted  . Nausea 10/02/2020  . Cataract 11/17/2018  . Asymmetrical hearing loss of left ear 04/21/2018  . Tinnitus of left ear 04/21/2018  . Acute sinus infection 12/06/2016  . Allergic rhinitis 03/14/2016  . Pes cavus 04/11/2014  . Vitamin D deficiency 08/04/2013  . Osteopenia 04/17/2013  . Rhinitis, non-allergic 05/26/2011  . SYNCOPE 06/28/2008  . Hyperlipidemia 04/20/2008  . History of colonic polyps 04/20/2008  . Diabetes type 2, controlled ([place]) 05/28/2007    Current Outpatient Medications on File Prior to Visit  Medication Sig Dispense Refill  . Calcium Carbonate-Vitamin D (CALCIUM 600 + D PO) Take 1 tablet by mouth 2 (two) times daily.    . Cholecalciferol (VITAMIN D3) 2000 UNITS TABS Take 1 tablet by mouth 2 (two) times daily.    . clobetasol cream (TEMOVATE) 9.24 % Apply 1 application  topically as needed.    . COD LIVER OIL PO Take by mouth.    . Coenzyme Q10 (COQ10 PO) Take 300 mg by mouth daily.    . fish oil-omega-3 fatty acids 1000 MG capsule Take 2 g by mouth daily.    . Flaxseed, Linseed, (FLAX SEED OIL) 1000 MG CAPS Take 1 capsule by mouth 2 (two) times daily.    . Garlic 268 MG TABS Take 1,000 mg by mouth 2 (two) times daily.    Marland Kitchen ibuprofen (ADVIL,MOTRIN) 200 MG tablet Take 200 mg by mouth. Take 3 pills prn    . IRON, FERROUS SULFATE, PO Take by mouth.    Marland Kitchen ketoconazole (NIZORAL) 2 % cream Apply 1 application topically 2 (two) times daily as needed for irritation. 15 g 0  . LORazepam (ATIVAN) 0.5 MG tablet Take 0.5-1 tablets (0.25-0.5 mg total) by mouth at bedtime as needed for anxiety or sleep. 30 tablet 1  . Multiple Vitamins-Minerals (CENTRUM SILVER 50+WOMEN PO) Take by mouth.    . Multiple Vitamins-Minerals (HAIR/SKIN/NAILS PO) Take 1 tablet by mouth daily.     . mupirocin ointment (BACTROBAN) 2 % Apply 1 application topically as needed.    . NON FORMULARY Enhanced Energy MVI-Take 1 pill bid    . ondansetron (ZOFRAN ODT) 4 MG disintegrating tablet Take 1 tablet (4 mg total) by mouth every 8 (eight) hours  as needed for nausea or vomiting. 20 tablet 0  . pyridOXINE (VITAMIN B-6) 100 MG tablet Take 100 mg by mouth 2 (two) times daily.    Dwyane Dee SILVER GEL Apply topically.    . triamcinolone (KENALOG) 0.025 % cream Apply 1 application topically 2 (two) times daily.    Marland Kitchen triamcinolone cream (KENALOG) 0.1 % Apply 1 application topically as needed.    . vitamin B-12 (CYANOCOBALAMIN) 1000 MCG tablet Take 1,000 mcg by mouth daily.    Marland Kitchen zinc gluconate 50 MG tablet Take 50 mg by mouth 2 (two) times daily.    Marland Kitchen ivermectin (STROMECTOL) 3 MG TABS tablet ivermectin 3 mg tablet  TAKE 4 AND 1/2 TABLETS BY MOUTH EVERY DAY UNTIL WELL (Patient not taking: Reported on 10/16/2020)     No current facility-administered medications on file prior to visit.    Past Medical History:   Diagnosis Date  . Allergy   . Arthritis    bil fingers  . Cataract    bil cataracts  . Diabetes mellitus without complication (HCC)    diet controlled  . Environmental allergies   . Ganglion cyst of left foot 06/22/2013  . Hyperlipidemia    low HDL  . Osteopenia     Past Surgical History:  Procedure Laterality Date  . COLONOSCOPY  2007   Adenomatous polyp  . COLONOSCOPY  2014   neg  . GANGLION CYST EXCISION Left    left foot  . POLYPECTOMY    . TONSILLECTOMY AND ADENOIDECTOMY  age 27  . TRIGGER FINGER RELEASE Left 08/15/2016    Social History   Socioeconomic History  . Marital status: Widowed    Spouse name: Not on file  . Number of children: Not on file  . Years of education: Not on file  . Highest education level: Not on file  Occupational History  . Not on file  Tobacco Use  . Smoking status: Never Smoker  . Smokeless tobacco: Never Used  Vaping Use  . Vaping Use: Never used  Substance and Sexual Activity  . Alcohol use: No  . Drug use: No  . Sexual activity: Not on file  Other Topics Concern  . Not on file  Social History Narrative   Exercise:  Water aerobics twice a week   Social Determinants of Health   Financial Resource Strain: Not on file  Food Insecurity: Not on file  Transportation Needs: Not on file  Physical Activity: Not on file  Stress: Not on file  Social Connections: Not on file    Family History  Problem Relation Age of Onset  . Allergies Mother   . Diabetes Mother   . Heart attack Father        > 31  . Diabetes Sister   . Alzheimer's disease Brother   . Colon cancer Maternal Grandfather   . Colitis Maternal Grandfather        died at 49 years old  . Stroke Neg Hx   . Cancer Neg Hx   . Esophageal cancer Neg Hx   . Rectal cancer Neg Hx   . Stomach cancer Neg Hx     Review of Systems  Constitutional: Positive for chills and fatigue. Negative for appetite change and fever.  Respiratory: Negative for cough, shortness of  breath and wheezing.   Cardiovascular: Positive for chest pain (maybe she is not sure) and palpitations (intermittent, fast HR). Negative for leg swelling.  Gastrointestinal: Positive for constipation and nausea (intermittent, randome). Negative  for abdominal pain, blood in stool and diarrhea.  Endocrine: Positive for cold intolerance.  Genitourinary: Negative for dysuria, frequency and hematuria.  Musculoskeletal: Positive for back pain (new). Negative for arthralgias.  Neurological: Negative for light-headedness and headaches.  Psychiatric/Behavioral: Positive for sleep disturbance (6 hrs w/o medication).       Objective:   Vitals:   10/16/20 1341  BP: (!) 146/68  Pulse: 73  Temp: 98.2 F (36.8 C)  SpO2: 98%   BP Readings from Last 3 Encounters:  10/16/20 (!) 146/68  11/18/19 128/74  11/17/18 132/64   Wt Readings from Last 3 Encounters:  11/18/19 141 lb 12.8 oz (64.3 kg)  11/17/18 157 lb 12.8 oz (71.6 kg)  02/26/18 149 lb 12.8 oz (67.9 kg)   Body mass index is 22.21 kg/m.   Physical Exam Constitutional:      General: She is not in acute distress.    Appearance: Normal appearance. She is not ill-appearing.  HENT:     Head: Normocephalic and atraumatic.  Eyes:     Conjunctiva/sclera: Conjunctivae normal.  Neck:     Vascular: No carotid bruit.  Cardiovascular:     Rate and Rhythm: Normal rate and regular rhythm.     Heart sounds: No murmur heard.   Pulmonary:     Effort: Pulmonary effort is normal. No respiratory distress.     Breath sounds: No wheezing or rales.  Abdominal:     General: There is no distension.     Palpations: Abdomen is soft.     Tenderness: There is abdominal tenderness (Mild tenderness across lower abdomen). There is no guarding or rebound.  Musculoskeletal:     Cervical back: Neck supple. No tenderness.     Right lower leg: No edema.     Left lower leg: No edema.  Lymphadenopathy:     Cervical: No cervical adenopathy.  Skin:     General: Skin is warm and dry.  Neurological:     Mental Status: She is alert.  Psychiatric:        Mood and Affect: Mood normal.            Assessment & Plan:    I spent 30 minutes dedicated to the care of this patient on the date of this encounter including review of recent labs,obtaining history, discussing possible causes of her symptoms, communicating with the patient, tests, and documenting clinical information in the EHR   See Problem List for Assessment and Plan of chronic medical problems.    This visit occurred during the SARS-CoV-2 public health emergency.  Safety protocols were in place, including screening questions prior to the visit, additional usage of staff PPE, and extensive cleaning of exam room while observing appropriate contact time as indicated for disinfecting solutions.

## 2020-10-16 NOTE — Assessment & Plan Note (Signed)
Chronic Sugars have been controlled with diet Will check A1c

## 2020-10-16 NOTE — Assessment & Plan Note (Signed)
Chronic Taking vitamin D daily Will check vitamin D level 

## 2020-10-16 NOTE — Assessment & Plan Note (Signed)
Acute on chronic Has chronic constipation, but has been worse in the past couple of months or so Continue stool softener-sounds like Colace-200 mg daily Continue MiraLAX daily-discussed that she can take this twice daily until she feels like her stools are back to normal Start Metamucil daily Discussed eating foods high in fiber Continue to drink plenty of water Check TSH to rule out thyroid issue

## 2020-10-16 NOTE — Assessment & Plan Note (Signed)
Acute Blood pressure elevated here today, but typically is well controlled We both agree this is likely related to her current symptoms Will monitor-no medication needed at this time

## 2020-10-16 NOTE — Assessment & Plan Note (Signed)
Subacute Ongoing since February sometimes No obvious cause Has had some worsening of her constipation, cold intolerance, heart racing Check CBC, iron panel, CMP and TSH

## 2020-10-17 LAB — IRON,TIBC AND FERRITIN PANEL
%SAT: 17 % (calc) (ref 16–45)
Ferritin: 74 ng/mL (ref 16–288)
Iron: 57 ug/dL (ref 45–160)
TIBC: 337 mcg/dL (calc) (ref 250–450)

## 2020-10-18 ENCOUNTER — Telehealth: Payer: Self-pay | Admitting: Internal Medicine

## 2020-10-18 NOTE — Telephone Encounter (Signed)
Patient called and is requesting a call back in regards to recent lab results. She can be reached at [phone]. Please advise

## 2020-10-18 NOTE — Telephone Encounter (Signed)
Your blood counts and iron levels are normal.  Your kidney function, thyroid function and liver tests are normal.  Your vitamin D level is good. Your sugars are in the prediabetic range. The one thing that is off that can cause some symptoms is your sodium level is low. Try decreasing your water intake. Consider drinking something like IVhydration packets in water, which have some added sodium. Work on your constipation and update me with how you are feeling. We need to repeat your blood work - we can do that at your June appt or sooner if you do not start to feel better.

## 2020-10-19 ENCOUNTER — Encounter: Payer: Self-pay | Admitting: Internal Medicine

## 2020-10-19 NOTE — Telephone Encounter (Signed)
Spoke with patient today and she received results already from my-chart.

## 2020-10-20 ENCOUNTER — Encounter: Payer: Self-pay | Admitting: Internal Medicine

## 2020-10-25 ENCOUNTER — Encounter: Payer: Self-pay | Admitting: Internal Medicine

## 2020-10-25 DIAGNOSIS — E871 Hypo-osmolality and hyponatremia: Secondary | ICD-10-CM

## 2020-11-19 ENCOUNTER — Encounter: Payer: Self-pay | Admitting: Internal Medicine

## 2020-11-19 NOTE — Progress Notes (Signed)
Subjective:    Patient ID: Lisa Mcgee, female    DOB: 1945/10/02, 75 y.o.   MRN: [id number]   This visit occurred during the SARS-CoV-2 public health emergency.  Safety protocols were in place, including screening questions prior to the visit, additional usage of staff PPE, and extensive cleaning of exam room while observing appropriate contact time as indicated for disinfecting solutions.    HPI She is here for a physical exam.   She has no concerns.  She feels great.  She stopped the lorazepam and that was causing all her symptoms.   Medications and allergies reviewed with patient and updated if appropriate.  Patient Active Problem List   Diagnosis Date Noted  . Hyponatremia 11/20/2020  . Constipation 10/16/2020  . Fatigue 10/16/2020  . Nausea 10/02/2020  . Cataract 11/17/2018  . Asymmetrical hearing loss of left ear 04/21/2018  . Tinnitus of left ear 04/21/2018  . Allergic rhinitis 03/14/2016  . Pes cavus 04/11/2014  . Vitamin D deficiency 08/04/2013  . Osteopenia 04/17/2013  . Rhinitis, non-allergic 05/26/2011  . SYNCOPE 06/28/2008  . Hyperlipidemia 04/20/2008  . History of colonic polyps 04/20/2008  . Diabetes type 2, controlled ([place]) 05/28/2007    Current Outpatient Medications on File Prior to Visit  Medication Sig Dispense Refill  . Calcium Carbonate-Vitamin D (CALCIUM 600 + D PO) Take 1 tablet by mouth 2 (two) times daily.    . Cholecalciferol (VITAMIN D3) 2000 UNITS TABS Take 1 tablet by mouth 2 (two) times daily.    . clobetasol cream (TEMOVATE) 8.56 % Apply 1 application topically as needed.    . COD LIVER OIL PO Take by mouth.    . Coenzyme Q10 (COQ10 PO) Take 300 mg by mouth daily.    . fish oil-omega-3 fatty acids 1000 MG capsule Take 2 g by mouth daily.    . Flaxseed, Linseed, (FLAX SEED OIL) 1000 MG CAPS Take 1 capsule by mouth 2 (two) times daily.    . Garlic 314 MG TABS Take 1,000 mg by mouth 2 (two) times daily.    Marland Kitchen ibuprofen (ADVIL,MOTRIN)  200 MG tablet Take 200 mg by mouth. Take 3 pills prn    . ketoconazole (NIZORAL) 2 % cream Apply 1 application topically 2 (two) times daily as needed for irritation. 15 g 0  . ketoconazole (NIZORAL) 2 % shampoo Apply topically.    . Multiple Vitamins-Minerals (CENTRUM SILVER 50+WOMEN PO) Take by mouth.    . Multiple Vitamins-Minerals (HAIR/SKIN/NAILS PO) Take 1 tablet by mouth daily.     . mupirocin ointment (BACTROBAN) 2 % Apply 1 application topically as needed.    . NON FORMULARY Enhanced Energy MVI-Take 1 pill bid    . ondansetron (ZOFRAN ODT) 4 MG disintegrating tablet Take 1 tablet (4 mg total) by mouth every 8 (eight) hours as needed for nausea or vomiting. 20 tablet 0  . pyridOXINE (VITAMIN B-6) 100 MG tablet Take 100 mg by mouth 2 (two) times daily.    Dwyane Dee SILVER GEL Apply topically.    . triamcinolone (KENALOG) 0.025 % cream Apply 1 application topically 2 (two) times daily.    Marland Kitchen triamcinolone cream (KENALOG) 0.1 % Apply 1 application topically as needed.    . vitamin B-12 (CYANOCOBALAMIN) 1000 MCG tablet Take 1,000 mcg by mouth daily.    Marland Kitchen zinc gluconate 50 MG tablet Take 50 mg by mouth 2 (two) times daily.     No current facility-administered medications on file prior to visit.  Past Medical History:  Diagnosis Date  . Allergy   . Arthritis    bil fingers  . Cataract    bil cataracts  . Diabetes mellitus without complication (HCC)    diet controlled  . Environmental allergies   . Ganglion cyst of left foot 06/22/2013  . Hyperlipidemia    low HDL  . Osteopenia     Past Surgical History:  Procedure Laterality Date  . COLONOSCOPY  2007   Adenomatous polyp  . COLONOSCOPY  2014   neg  . GANGLION CYST EXCISION Left    left foot  . POLYPECTOMY    . TONSILLECTOMY AND ADENOIDECTOMY  age 82  . TRIGGER FINGER RELEASE Left 08/15/2016    Social History   Socioeconomic History  . Marital status: Widowed    Spouse name: Not on file  . Number of children: Not on  file  . Years of education: Not on file  . Highest education level: Not on file  Occupational History  . Not on file  Tobacco Use  . Smoking status: Never  . Smokeless tobacco: Never  Vaping Use  . Vaping Use: Never used  Substance and Sexual Activity  . Alcohol use: No  . Drug use: No  . Sexual activity: Not on file  Other Topics Concern  . Not on file  Social History Narrative   Exercise:  Water aerobics twice a week   Social Determinants of Health   Financial Resource Strain: Not on file  Food Insecurity: Not on file  Transportation Needs: Not on file  Physical Activity: Not on file  Stress: Not on file  Social Connections: Not on file    Family History  Problem Relation Age of Onset  . Allergies Mother   . Diabetes Mother   . Heart attack Father        > 25  . Diabetes Sister   . Alzheimer's disease Brother   . Colon cancer Maternal Grandfather   . Colitis Maternal Grandfather        died at 65 years old  . Stroke Neg Hx   . Cancer Neg Hx   . Esophageal cancer Neg Hx   . Rectal cancer Neg Hx   . Stomach cancer Neg Hx     Review of Systems  Constitutional:  Negative for chills and fever.  Eyes:  Negative for visual disturbance.  Respiratory:  Negative for cough, shortness of breath and wheezing.   Cardiovascular:  Negative for chest pain, palpitations and leg swelling.  Gastrointestinal:  Positive for constipation (occ). Negative for abdominal pain, blood in stool, diarrhea and nausea.       No gerd  Genitourinary:  Negative for dysuria and hematuria.  Musculoskeletal:  Negative for arthralgias and back pain.  Skin:  Negative for color change and rash.  Neurological:  Negative for dizziness, light-headedness and headaches.  Psychiatric/Behavioral:  Negative for dysphoric mood. The patient is not nervous/anxious.       Objective:   Vitals:   11/20/20 0749  BP: 130/74  Pulse: 72  Temp: 98 F (36.7 C)  SpO2: 99%   Filed Weights   11/20/20 0749   Weight: 128 lb (58.1 kg)   Body mass index is 20.05 kg/m.  BP Readings from Last 3 Encounters:  11/20/20 130/74  10/16/20 (!) 146/68  11/18/19 128/74    Wt Readings from Last 3 Encounters:  11/20/20 128 lb (58.1 kg)  11/18/19 141 lb 12.8 oz (64.3 kg)  11/17/18 157 lb  12.8 oz (71.6 kg)    Depression screen [facility] 2/9 11/20/2020 11/18/2019 11/17/2018 11/11/2017 11/07/2016  Decreased Interest 0 0 0 0 0  Down, Depressed, Hopeless 0 0 0 0 0  PHQ - 2 Score 0 0 0 0 0  Altered sleeping 0 - - - -  Tired, decreased energy 0 - - - -  Change in appetite 0 - - - -  Feeling bad or failure about yourself  0 - - - -  Trouble concentrating 0 - - - -  Moving slowly or fidgety/restless 0 - - - -  Suicidal thoughts 0 - - - -  PHQ-9 Score 0 - - - -    GAD 7 : Generalized Anxiety Score 11/20/2020  Nervous, Anxious, on Edge 0  Control/stop worrying 0  Worry too much - different things 0  Trouble relaxing 0  Restless 0  Easily annoyed or irritable 0  Afraid - awful might happen 0  Total GAD 7 Score 0        Physical Exam Constitutional: She appears well-developed and well-nourished. No distress.  HENT:  Head: Normocephalic and atraumatic.  Right Ear: External ear normal. Normal ear canal and TM Left Ear: External ear normal.  Normal ear canal and TM Mouth/Throat: Oropharynx is clear and moist.  Eyes: Conjunctivae and EOM are normal.  Neck: Neck supple. No tracheal deviation present. No thyromegaly present.  No carotid bruit  Cardiovascular: Normal rate, regular rhythm and normal heart sounds.   No murmur heard.  No edema. Pulmonary/Chest: Effort normal and breath sounds normal. No respiratory distress. She has no wheezes. She has no rales.  Breast: deferred   Abdominal: Soft. She exhibits no distension. There is no tenderness.  Lymphadenopathy: She has no cervical adenopathy.  Skin: Skin is warm and dry. She is not diaphoretic.  Psychiatric: She has a normal mood and affect. Her behavior  is normal.   Diabetic Foot Exam - Simple   Simple Foot Form Diabetic Foot exam was performed with the following findings: Yes 11/20/2020  8:26 AM  Visual Inspection No deformities, no ulcerations, no other skin breakdown bilaterally: Yes Sensation Testing Intact to touch and monofilament testing bilaterally: Yes Pulse Check Posterior Tibialis and Dorsalis pulse intact bilaterally: Yes Comments         Assessment & Plan:   Physical exam: Screening blood work    ordered Immunizations  up to date, except for covid Colonoscopy  Up to date  Mammogram  [facility] to date  - [facility] Gyn  up to date - [facility] Dexa   Up to date  Eye exams  Up to date  Exercise  yard work, very active Weight  normal  Substance abuse  none   Screened for depression using the PHQ 9 scale.  No evidence of depression.    Screened for anxiety using GAD7 Scale.  No evidence of anxiety.     See Problem List for Assessment and Plan of chronic medical problems.

## 2020-11-19 NOTE — Assessment & Plan Note (Addendum)
Chronic Lab Results  Component Value Date   HGBA1C 6.1 10/16/2020   Diet controlled-well-controlled Check urine microalbumin

## 2020-11-19 NOTE — Patient Instructions (Addendum)
  Blood work was ordered.      Medications changes include :   none     Please followup in 1 year

## 2020-11-19 NOTE — Assessment & Plan Note (Addendum)
Chronic dexa up to date Exercising regularly Continue calcium and vitamin d daily Vitamin D level very good

## 2020-11-20 ENCOUNTER — Ambulatory Visit (INDEPENDENT_AMBULATORY_CARE_PROVIDER_SITE_OTHER): Payer: PPO | Admitting: Internal Medicine

## 2020-11-20 ENCOUNTER — Other Ambulatory Visit: Payer: Self-pay

## 2020-11-20 VITALS — BP 130/74 | HR 72 | Temp 98.0°F | Ht 67.0 in | Wt 128.0 lb

## 2020-11-20 DIAGNOSIS — M8580 Other specified disorders of bone density and structure, unspecified site: Secondary | ICD-10-CM

## 2020-11-20 DIAGNOSIS — Z0000 Encounter for general adult medical examination without abnormal findings: Secondary | ICD-10-CM

## 2020-11-20 DIAGNOSIS — E119 Type 2 diabetes mellitus without complications: Secondary | ICD-10-CM

## 2020-11-20 DIAGNOSIS — E871 Hypo-osmolality and hyponatremia: Secondary | ICD-10-CM | POA: Diagnosis not present

## 2020-11-20 DIAGNOSIS — R110 Nausea: Secondary | ICD-10-CM | POA: Diagnosis not present

## 2020-11-20 LAB — LIPID PANEL
Cholesterol: 137 mg/dL (ref 0–200)
HDL: 63.6 mg/dL (ref >39.00)
LDL Cholesterol: 65 mg/dL (ref 0–99)
NonHDL: 73.18
Total CHOL/HDL Ratio: 2
Triglycerides: 41 mg/dL (ref 0.0–149.0)
VLDL: 8.2 mg/dL (ref 0.0–40.0)

## 2020-11-20 LAB — BASIC METABOLIC PANEL
BUN: 11 mg/dL (ref 6–23)
CO2: 28 mEq/L (ref 19–32)
Calcium: 9.5 mg/dL (ref 8.4–10.5)
Chloride: 104 mEq/L (ref 96–112)
Creatinine, Ser: 0.82 mg/dL (ref 0.40–1.20)
GFR: 71.33 mL/min (ref >60.00)
Glucose, Bld: 117 mg/dL — ABNORMAL HIGH (ref 70–99)
Potassium: 4.4 mEq/L (ref 3.5–5.1)
Sodium: 139 mEq/L (ref 135–145)

## 2020-11-20 LAB — MICROALBUMIN / CREATININE URINE RATIO
Creatinine,U: 102.7 mg/dL
Microalb Creat Ratio: 0.9 mg/g (ref 0.0–30.0)
Microalb, Ur: 0.9 mg/dL (ref 0.0–1.9)

## 2020-11-20 NOTE — Addendum Note (Signed)
Addended by: Boris Lown B on: 11/20/2020 08:31 AM   Modules accepted: Orders

## 2020-11-20 NOTE — Assessment & Plan Note (Signed)
Recent blood work showed hyponatremia BMP today

## 2020-11-22 LAB — OSMOLALITY: Osmolality: 306 mOsm/kg — ABNORMAL HIGH (ref 278–305)

## 2020-12-22 DIAGNOSIS — M79672 Pain in left foot: Secondary | ICD-10-CM | POA: Diagnosis not present

## 2020-12-22 DIAGNOSIS — M25572 Pain in left ankle and joints of left foot: Secondary | ICD-10-CM | POA: Diagnosis not present

## 2021-01-17 DIAGNOSIS — M79672 Pain in left foot: Secondary | ICD-10-CM | POA: Diagnosis not present

## 2021-01-17 DIAGNOSIS — M25572 Pain in left ankle and joints of left foot: Secondary | ICD-10-CM | POA: Diagnosis not present

## 2021-01-17 DIAGNOSIS — S93492A Sprain of other ligament of left ankle, initial encounter: Secondary | ICD-10-CM | POA: Diagnosis not present

## 2021-01-19 DIAGNOSIS — S93402A Sprain of unspecified ligament of left ankle, initial encounter: Secondary | ICD-10-CM | POA: Diagnosis not present

## 2021-05-15 DIAGNOSIS — J40 Bronchitis, not specified as acute or chronic: Secondary | ICD-10-CM | POA: Diagnosis not present

## 2021-05-15 DIAGNOSIS — J111 Influenza due to unidentified influenza virus with other respiratory manifestations: Secondary | ICD-10-CM | POA: Diagnosis not present

## 2021-05-15 DIAGNOSIS — J329 Chronic sinusitis, unspecified: Secondary | ICD-10-CM | POA: Diagnosis not present

## 2021-05-28 ENCOUNTER — Ambulatory Visit (INDEPENDENT_AMBULATORY_CARE_PROVIDER_SITE_OTHER): Payer: PPO

## 2021-05-28 ENCOUNTER — Other Ambulatory Visit: Payer: Self-pay

## 2021-05-28 DIAGNOSIS — Z23 Encounter for immunization: Secondary | ICD-10-CM | POA: Diagnosis not present

## 2021-05-28 NOTE — Progress Notes (Signed)
Pt given HD flu vacc w/o any complications.

## 2021-07-11 DIAGNOSIS — L408 Other psoriasis: Secondary | ICD-10-CM | POA: Diagnosis not present

## 2021-07-11 DIAGNOSIS — L400 Psoriasis vulgaris: Secondary | ICD-10-CM | POA: Diagnosis not present

## 2021-07-11 DIAGNOSIS — L718 Other rosacea: Secondary | ICD-10-CM | POA: Diagnosis not present

## 2021-07-25 DIAGNOSIS — E119 Type 2 diabetes mellitus without complications: Secondary | ICD-10-CM | POA: Diagnosis not present

## 2021-07-25 LAB — HM DIABETES EYE EXAM

## 2021-07-26 ENCOUNTER — Encounter: Payer: Self-pay | Admitting: Internal Medicine

## 2021-07-26 NOTE — Progress Notes (Signed)
Subjective:    Patient ID: Lisa Mcgee, female    DOB: 03-24-1946, 76 y.o.   MRN: [id number]  This visit occurred during the SARS-CoV-2 public health emergency.  Safety protocols were in place, including screening questions prior to the visit, additional usage of staff PPE, and extensive cleaning of exam room while observing appropriate contact time as indicated for disinfecting solutions.     HPI The patient is here for follow up from her eye doctor.  She saw the eye doctor Wednesday for a routine visit.  She was told she had a retinal branch vein occlusion.  She denies any loss of vision and was unsure when this happened-she sees the eye doctor on a annual basis.  She has no history of smoking.  She denies any blurry vision, double vision.  She does state palpitations that she has at least on a weekly basis.  She often feels them at night.   Medications and allergies reviewed with patient and updated if appropriate.  Patient Active Problem List   Diagnosis Date Noted   Branch retinal artery occlusion, left eye 07/27/2021   Hyponatremia 11/20/2020   Constipation 10/16/2020   Fatigue 10/16/2020   Nausea 10/02/2020   Cataract 11/17/2018   Asymmetrical hearing loss of left ear 04/21/2018   Tinnitus of left ear 04/21/2018   Allergic rhinitis 03/14/2016   Pes cavus 04/11/2014   Vitamin D deficiency 08/04/2013   Osteopenia 04/17/2013   Rhinitis, non-allergic 05/26/2011   SYNCOPE 06/28/2008   Palpitations 06/28/2008   Hyperlipidemia 04/20/2008   History of colonic polyps 04/20/2008   Diabetes type 2, controlled ([place]) 05/28/2007    Current Outpatient Medications on File Prior to Visit  Medication Sig Dispense Refill   Calcium Carbonate-Vitamin D (CALCIUM 600 + D PO) Take 1 tablet by mouth 2 (two) times daily.     Cholecalciferol (VITAMIN D3) 2000 UNITS TABS Take 1 tablet by mouth 2 (two) times daily.     clobetasol cream (TEMOVATE) 0.94 % Apply 1 application topically  as needed.     COD LIVER OIL PO Take by mouth.     Coenzyme Q10 (COQ10 PO) Take 300 mg by mouth daily.     fish oil-omega-3 fatty acids 1000 MG capsule Take 2 g by mouth daily.     Flaxseed, Linseed, (FLAX SEED OIL) 1000 MG CAPS Take 1 capsule by mouth 2 (two) times daily.     Garlic 709 MG TABS Take 1,000 mg by mouth 2 (two) times daily.     ibuprofen (ADVIL,MOTRIN) 200 MG tablet Take 200 mg by mouth. Take 3 pills prn     ketoconazole (NIZORAL) 2 % cream Apply 1 application topically 2 (two) times daily as needed for irritation. 15 g 0   ketoconazole (NIZORAL) 2 % shampoo Apply topically.     Multiple Vitamins-Minerals (CENTRUM SILVER 50+WOMEN PO) Take by mouth.     Multiple Vitamins-Minerals (HAIR/SKIN/NAILS PO) Take 1 tablet by mouth daily.      mupirocin ointment (BACTROBAN) 2 % Apply 1 application topically as needed.     NON FORMULARY Enhanced Energy MVI-Take 1 pill bid     pyridOXINE (VITAMIN B-6) 100 MG tablet Take 100 mg by mouth 2 (two) times daily.     RESTA SILVER GEL Apply topically.     triamcinolone (KENALOG) 0.025 % cream Apply 1 application topically 2 (two) times daily.     triamcinolone cream (KENALOG) 0.1 % Apply 1 application topically as needed.  vitamin B-12 (CYANOCOBALAMIN) 1000 MCG tablet Take 1,000 mcg by mouth daily.     zinc gluconate 50 MG tablet Take 50 mg by mouth 2 (two) times daily.     No current facility-administered medications on file prior to visit.    Past Medical History:  Diagnosis Date   Allergy    Arthritis    bil fingers   Cataract    bil cataracts   Diabetes mellitus without complication ([place])    diet controlled   Environmental allergies    Ganglion cyst of left foot 06/22/2013   Hyperlipidemia    low HDL   Osteopenia     Past Surgical History:  Procedure Laterality Date   COLONOSCOPY  2007   Adenomatous polyp   COLONOSCOPY  2014   neg   GANGLION CYST EXCISION Left    left foot   POLYPECTOMY     TONSILLECTOMY AND  ADENOIDECTOMY  age 24   TRIGGER FINGER RELEASE Left 08/15/2016    Social History   Socioeconomic History   Marital status: Widowed    Spouse name: Not on file   Number of children: Not on file   Years of education: Not on file   Highest education level: Not on file  Occupational History   Not on file  Tobacco Use   Smoking status: Never   Smokeless tobacco: Never  Vaping Use   Vaping Use: Never used  Substance and Sexual Activity   Alcohol use: No   Drug use: No   Sexual activity: Not on file  Other Topics Concern   Not on file  Social History Narrative   Exercise:  Water aerobics twice a week   Social Determinants of Health   Financial Resource Strain: Not on file  Food Insecurity: Not on file  Transportation Needs: Not on file  Physical Activity: Not on file  Stress: Not on file  Social Connections: Not on file    Family History  Problem Relation Age of Onset   Allergies Mother    Diabetes Mother    Heart attack Father        > 77   Diabetes Sister    Alzheimer's disease Brother    Colon cancer Maternal Grandfather    Colitis Maternal Grandfather        died at 42 years old   Stroke Neg Hx    Cancer Neg Hx    Esophageal cancer Neg Hx    Rectal cancer Neg Hx    Stomach cancer Neg Hx     Review of Systems  Constitutional:  Negative for fever.  Eyes:  Negative for pain and visual disturbance.  Respiratory:  Negative for cough, shortness of breath and wheezing.   Cardiovascular:  Positive for palpitations (frequent). Negative for chest pain.  Neurological:  Positive for headaches (occ, not new). Negative for dizziness and light-headedness.       No change in balance      Objective:   Vitals:   07/27/21 0952  BP: 122/60  Pulse: 90  Temp: 98.1 F (36.7 C)  SpO2: 98%   BP Readings from Last 3 Encounters:  07/27/21 122/60  11/20/20 130/74  10/16/20 (!) 146/68   Wt Readings from Last 3 Encounters:  07/27/21 140 lb (63.5 kg)  11/20/20 128 lb  (58.1 kg)  11/18/19 141 lb 12.8 oz (64.3 kg)   Body mass index is 21.93 kg/m.   Physical Exam Constitutional:      General: She is not in  acute distress.    Appearance: Normal appearance.  HENT:     Head: Normocephalic and atraumatic.  Eyes:     Conjunctiva/sclera: Conjunctivae normal.  Cardiovascular:     Rate and Rhythm: Normal rate and regular rhythm.     Heart sounds: Normal heart sounds. No murmur heard. Pulmonary:     Effort: Pulmonary effort is normal. No respiratory distress.     Breath sounds: Normal breath sounds. No wheezing.  Musculoskeletal:     Cervical back: Neck supple.     Right lower leg: No edema.     Left lower leg: No edema.  Lymphadenopathy:     Cervical: No cervical adenopathy.  Skin:    Findings: No rash.  Neurological:     Mental Status: She is alert. Mental status is at baseline.  Psychiatric:        Mood and Affect: Mood normal.        Behavior: Behavior normal.          Assessment & Plan:    See Problem List for Assessment and Plan of chronic medical problems.

## 2021-07-27 ENCOUNTER — Other Ambulatory Visit: Payer: Self-pay

## 2021-07-27 ENCOUNTER — Ambulatory Visit (INDEPENDENT_AMBULATORY_CARE_PROVIDER_SITE_OTHER): Payer: PPO

## 2021-07-27 ENCOUNTER — Encounter: Payer: Self-pay | Admitting: Internal Medicine

## 2021-07-27 ENCOUNTER — Ambulatory Visit (INDEPENDENT_AMBULATORY_CARE_PROVIDER_SITE_OTHER): Payer: PPO | Admitting: Internal Medicine

## 2021-07-27 VITALS — BP 122/60 | HR 90 | Temp 98.1°F | Ht 67.0 in | Wt 140.0 lb

## 2021-07-27 DIAGNOSIS — R002 Palpitations: Secondary | ICD-10-CM | POA: Diagnosis not present

## 2021-07-27 DIAGNOSIS — H34232 Retinal artery branch occlusion, left eye: Secondary | ICD-10-CM

## 2021-07-27 MED ORDER — ASPIRIN 81 MG PO TBEC: 81.0000 mg | DELAYED_RELEASE_TABLET | Freq: Every day | ORAL | 12 refills | Status: DC

## 2021-07-27 NOTE — Assessment & Plan Note (Signed)
New She states palpitations frequently-at least every week-often feels limited night Given recent retinal branch artery occlusion will get Holter monitor for 7 days, echocardiogram and refer to cardiac

## 2021-07-27 NOTE — Progress Notes (Unsigned)
Enrolled pt for 7 day Zio XT to be mailed to home address.

## 2021-07-27 NOTE — Assessment & Plan Note (Signed)
Acute Saw her eye doctor 2 days ago and was diagnosed with a retinal artery occlusion in the left eye-no loss of vision Start aspirin 81 mg daily Ultrasound of carotid arteries Echocardiogram Holter for 7 days given palpitations Will refer to cardiology Has follow-up with retinal specialist

## 2021-07-27 NOTE — Patient Instructions (Addendum)
° °  ° ° °  Medications changes include :   start aspirin 81 mg daily    A carotid artery [place] was ordered.  An Echo was ordered.  A holter monitor was ordered.     A referral was ordered for cardiology.     Someone from that office will call you to schedule an appointment.

## 2021-07-31 DIAGNOSIS — H34232 Retinal artery branch occlusion, left eye: Secondary | ICD-10-CM | POA: Diagnosis not present

## 2021-07-31 DIAGNOSIS — R002 Palpitations: Secondary | ICD-10-CM | POA: Diagnosis not present

## 2021-08-01 DIAGNOSIS — H35363 Drusen (degenerative) of macula, bilateral: Secondary | ICD-10-CM | POA: Diagnosis not present

## 2021-08-01 DIAGNOSIS — H3562 Retinal hemorrhage, left eye: Secondary | ICD-10-CM | POA: Diagnosis not present

## 2021-08-01 DIAGNOSIS — H348322 Tributary (branch) retinal vein occlusion, left eye, stable: Secondary | ICD-10-CM | POA: Diagnosis not present

## 2021-08-01 DIAGNOSIS — H43813 Vitreous degeneration, bilateral: Secondary | ICD-10-CM | POA: Diagnosis not present

## 2021-08-08 ENCOUNTER — Encounter: Payer: Self-pay | Admitting: Internal Medicine

## 2021-08-08 DIAGNOSIS — Z1231 Encounter for screening mammogram for malignant neoplasm of breast: Secondary | ICD-10-CM | POA: Diagnosis not present

## 2021-08-08 DIAGNOSIS — M8588 Other specified disorders of bone density and structure, other site: Secondary | ICD-10-CM | POA: Diagnosis not present

## 2021-08-08 DIAGNOSIS — N958 Other specified menopausal and perimenopausal disorders: Secondary | ICD-10-CM | POA: Diagnosis not present

## 2021-08-08 DIAGNOSIS — R29890 Loss of height: Secondary | ICD-10-CM | POA: Diagnosis not present

## 2021-08-08 NOTE — Progress Notes (Signed)
Outside notes received. Information abstracted. Notes sent to scan.  

## 2021-08-13 DIAGNOSIS — Z6821 Body mass index (BMI) 21.0-21.9, adult: Secondary | ICD-10-CM | POA: Diagnosis not present

## 2021-08-13 DIAGNOSIS — Z01419 Encounter for gynecological examination (general) (routine) without abnormal findings: Secondary | ICD-10-CM | POA: Diagnosis not present

## 2021-08-14 ENCOUNTER — Other Ambulatory Visit: Payer: Self-pay

## 2021-08-14 ENCOUNTER — Ambulatory Visit (HOSPITAL_COMMUNITY)
Admission: RE | Admit: 2021-08-14 | Discharge: 2021-08-14 | Disposition: A | Discharge status: Home / Self Care | Payer: PPO | Source: Ambulatory Visit | Attending: Cardiology | Admitting: Cardiology

## 2021-08-14 DIAGNOSIS — H34232 Retinal artery branch occlusion, left eye: Secondary | ICD-10-CM | POA: Insufficient documentation

## 2021-08-15 ENCOUNTER — Encounter: Payer: Self-pay | Admitting: Internal Medicine

## 2021-08-15 DIAGNOSIS — I6529 Occlusion and stenosis of unspecified carotid artery: Secondary | ICD-10-CM | POA: Insufficient documentation

## 2021-08-24 ENCOUNTER — Encounter: Payer: Self-pay | Admitting: Cardiology

## 2021-08-24 ENCOUNTER — Ambulatory Visit: Payer: PPO | Admitting: Cardiology

## 2021-08-24 ENCOUNTER — Other Ambulatory Visit: Payer: Self-pay

## 2021-08-24 DIAGNOSIS — H34232 Retinal artery branch occlusion, left eye: Secondary | ICD-10-CM | POA: Diagnosis not present

## 2021-08-24 DIAGNOSIS — I6523 Occlusion and stenosis of bilateral carotid arteries: Secondary | ICD-10-CM | POA: Diagnosis not present

## 2021-08-24 DIAGNOSIS — R002 Palpitations: Secondary | ICD-10-CM | POA: Diagnosis not present

## 2021-08-24 NOTE — Assessment & Plan Note (Signed)
Seen by ophthalmologist.  Asymptomatic.  Continue with aspirin, plaque stabilization.  Recommend Crestor 10 mg once a day. ?

## 2021-08-24 NOTE — Patient Instructions (Signed)
Medication Instructions:  ?The current medical regimen is effective;  continue present plan and medications. ? ?*If you need a refill on your cardiac medications before your next appointment, please call your pharmacy* ? ?Follow-Up: ?At [facility], you and your health needs are our priority.  As part of our continuing mission to provide you with exceptional heart care, we have created designated Provider Care Teams.  These Care Teams include your primary Cardiologist (physician) and Advanced Practice Providers (APPs -  Physician Assistants and Nurse Practitioners) who all work together to provide you with the care you need, when you need it. ? ?We recommend signing up for the patient portal called "MyChart".  Sign up information is provided on this After Visit Summary.  MyChart is used to connect with patients for Virtual Visits (Telemedicine).  Patients are able to view lab/test results, encounter notes, upcoming appointments, etc.  Non-urgent messages can be sent to your provider as well.   ?To learn more about what you can do with MyChart, go to [email or web address].   ? ?Your next appointment:   ?Follow up as needed with Dr Marlou Porch. ? ?Thank you for choosing [facility]!! ? ? ? ?

## 2021-08-24 NOTE — Assessment & Plan Note (Addendum)
Mild plaque bilaterally.  Likely has no relation to retinal artery occlusion.  Nonetheless, recommend aspirin 81 mg.  Recommend Crestor, would start 10 mg once a day for plaque stabilization.  Expressed the importance. ?I also recommended a coronary calcium score to see if there was any coronary plaque.  $99 test.  She did not wish to proceed.  There was hesitancy. ?

## 2021-08-24 NOTE — Progress Notes (Signed)
?Cardiology Office Note:   ? ?Date:  08/24/2021  ? ?ID:  Lisa Mcgee, DOB 10-25-1945, MRN [id number] ? ?PCP:  Binnie Rail, MD ?  ?[facility] Providers ?Cardiologist:  None    ? ?Referring MD: Binnie Rail, MD  ? ? ?History of Present Illness:   ? ?Lisa Mcgee is a 76 y.o. female here for the evaluation of of central retinal artery occlusion at the request of Dr. Billey Gosling.  She was told she had a retinal branch vein occlusion from ophthalmologist.  She denies any loss of vision and was unsure when this happened.  No blurry vision no double vision. ? ?She has palpitations on a weekly basis often feels them at night. ? ?A ZIO monitor was ordered on 07/27/2021.  I do not see results. ? ?Carotid Doppler 08/14/2021: ? ?Right Carotid: There is no evidence of stenosis in the right ICA. The  ?               extracranial vessels were near-normal with only minimal  ?wall  ?               thickening or plaque.  ? ?Left Carotid: Velocities in the left ICA are consistent with a 1-39%  ?stenosis.  ?              Minimal plaque.  ? ?Vertebrals:  Bilateral vertebral arteries demonstrate antegrade flow.  ?Subclavians: Normal flow hemodynamics were seen in bilateral subclavian  ?             arteries.  ? ?Overall still doing well.  Exercises quite vigorously in the garden for instance.  No chest pain.  No early family history of CAD. ? ? ? ?Past Medical History:  ?Diagnosis Date  ? Allergy   ? Arthritis   ? bil fingers  ? Cataract   ? bil cataracts  ? Diabetes mellitus without complication ([place])   ? diet controlled  ? Environmental allergies   ? Ganglion cyst of left foot 06/22/2013  ? Hyperlipidemia   ? low HDL  ? Osteopenia   ? ? ?Past Surgical History:  ?Procedure Laterality Date  ? COLONOSCOPY  2007  ? Adenomatous polyp  ? COLONOSCOPY  2014  ? neg  ? GANGLION CYST EXCISION Left   ? left foot  ? POLYPECTOMY    ? TONSILLECTOMY AND ADENOIDECTOMY  age 37  ? TRIGGER FINGER RELEASE Left 08/15/2016  ? ? ?Current  Medications: ?Current Meds  ?Medication Sig  ? Calcium Carbonate-Vitamin D (CALCIUM 600 + D PO) Take 1 tablet by mouth 2 (two) times daily.  ? Cholecalciferol (VITAMIN D3) 2000 UNITS TABS Take 1 tablet by mouth 2 (two) times daily.  ? clobetasol cream (TEMOVATE) 6.96 % Apply 1 application topically as needed.  ? COD LIVER OIL PO Take by mouth.  ? Coenzyme Q10 (COQ10 PO) Take 300 mg by mouth daily.  ? fish oil-omega-3 fatty acids 1000 MG capsule Take 2 g by mouth daily.  ? Flaxseed, Linseed, (FLAX SEED OIL) 1000 MG CAPS Take 1 capsule by mouth 2 (two) times daily.  ? Garlic 789 MG TABS Take 1,000 mg by mouth 2 (two) times daily.  ? ibuprofen (ADVIL,MOTRIN) 200 MG tablet Take 200 mg by mouth. Take 3 pills prn  ? Multiple Vitamins-Minerals (HAIR/SKIN/NAILS PO) Take 1 tablet by mouth daily.   ? mupirocin ointment (BACTROBAN) 2 % Apply 1 application topically as needed.  ? NON FORMULARY [employer] MVI-Take 1  pill bid  ? pyridOXINE (VITAMIN B-6) 100 MG tablet Take 100 mg by mouth 2 (two) times daily.  ? triamcinolone (KENALOG) 0.025 % cream Apply 1 application topically 2 (two) times daily.  ? triamcinolone cream (KENALOG) 0.1 % Apply 1 application topically as needed.  ? vitamin B-12 (CYANOCOBALAMIN) 1000 MCG tablet Take 1,000 mcg by mouth daily.  ? zinc gluconate 50 MG tablet Take 50 mg by mouth 2 (two) times daily.  ?  ? ?Allergies:   Atropine, Belladonna alkaloids, Doxycycline, Prevnar [pneumococcal 13-val conj vacc], Eggs or egg-derived products, Lorazepam, and Other  ? ?Social History  ? ?Socioeconomic History  ? Marital status: Widowed  ?  Spouse name: Not on file  ? Number of children: Not on file  ? Years of education: Not on file  ? Highest education level: Not on file  ?Occupational History  ? Not on file  ?Tobacco Use  ? Smoking status: Never  ? Smokeless tobacco: Never  ?Vaping Use  ? Vaping Use: Never used  ?Substance and Sexual Activity  ? Alcohol use: No  ? Drug use: No  ? Sexual activity: Not on file   ?Other Topics Concern  ? Not on file  ?Social History Narrative  ? Exercise:  Water aerobics twice a week  ? ?Social Determinants of Health  ? ?Financial Resource Strain: Not on file  ?Food Insecurity: Not on file  ?Transportation Needs: Not on file  ?Physical Activity: Not on file  ?Stress: Not on file  ?Social Connections: Not on file  ?  ? ?Family History: ?The patient's family history includes Allergies in her mother; Alzheimer's disease in her brother; Colitis in her maternal grandfather; Colon cancer in her maternal grandfather; Diabetes in her mother and sister; Heart attack in her father. There is no history of Stroke, Cancer, Esophageal cancer, Rectal cancer, or Stomach cancer. ? ?ROS:   ?Please see the history of present illness.    ? All other systems reviewed and are negative. ? ?EKGs/Labs/Other Studies Reviewed:   ? ?The following studies were reviewed today: ?Carotid Dopplers-mild plaque bilaterally ? ?LDL currently 65.  Creatinine 0.82. ? ?EKG:  EKG is  ordered today.  The ekg ordered today demonstrates sinus rhythm 68 no other abnormalities. ? ?Recent Labs: ?10/16/2020: ALT 14; Hemoglobin 13.1; Platelets 247.0; TSH 0.39 ?11/20/2020: BUN 11; Creatinine, Ser 0.82; Potassium 4.4; Sodium 139  ?Recent Lipid Panel ?   ?Component Value Date/Time  ? CHOL 137 11/20/2020 0831  ? TRIG 41.0 11/20/2020 0831  ? TRIG 45 04/22/2006 0910  ? HDL 63.60 11/20/2020 0831  ? CHOLHDL 2 11/20/2020 0831  ? VLDL 8.2 11/20/2020 0831  ? [place] 65 11/20/2020 0831  ? ? ? ?Risk Assessment/Calculations:   ? ? ?    ? ?   ? ?Physical Exam:   ? ?VS:  BP 140/70   Pulse 68   Ht '5\' 6"'$  (1.676 m)   Wt 139 lb 12.8 oz (63.4 kg)   SpO2 98%   BMI 22.56 kg/m?    ? ?Wt Readings from Last 3 Encounters:  ?08/24/21 139 lb 12.8 oz (63.4 kg)  ?07/27/21 140 lb (63.5 kg)  ?11/20/20 128 lb (58.1 kg)  ?  ? ?GEN:  Well nourished, well developed in no acute distress ?HEENT: Normal ?NECK: No JVD; No carotid bruits ?LYMPHATICS: No  lymphadenopathy ?CARDIAC: RRR, no murmurs, no rubs, gallops ?RESPIRATORY:  Clear to auscultation without rales, wheezing or rhonchi  ?ABDOMEN: Soft, non-tender, non-distended ?MUSCULOSKELETAL:  No edema; No deformity  ?  SKIN: Warm and dry ?NEUROLOGIC:  Alert and oriented x 3 ?PSYCHIATRIC:  Normal affect  ? ?ASSESSMENT:   ? ?1. Bilateral carotid artery stenosis   ?2. Branch retinal artery occlusion, left eye   ?3. Palpitations   ? ?PLAN:   ? ?In order of problems listed above: ? ?Carotid artery stenosis ?Mild plaque bilaterally.  Likely has no relation to retinal artery occlusion.  Nonetheless, recommend aspirin 81 mg.  Recommend Crestor, would start 10 mg once a day for plaque stabilization.  Expressed the importance. ?I also recommended a coronary calcium score to see if there was any coronary plaque.  $99 test.  She did not wish to proceed.  There was hesitancy. ? ?Branch retinal artery occlusion, left eye ?Seen by ophthalmologist.  Asymptomatic.  Continue with aspirin, plaque stabilization.  Recommend Crestor 10 mg once a day. ? ?Palpitations ?A Zio patch monitor has been ordered according to epic.  I do not see any results as of yet.  Our staff is working on tracking down. ?  ? ? ?  ? ? ?Medication Adjustments/Labs and Tests Ordered: ?Current medicines are reviewed at length with the patient today.  Concerns regarding medicines are outlined above.  ?Orders Placed This Encounter  ?Procedures  ? EKG 12-Lead  ? ?No orders of the defined types were placed in this encounter. ? ? ?Patient Instructions  ?Medication Instructions:  ?The current medical regimen is effective;  continue present plan and medications. ? ?*If you need a refill on your cardiac medications before your next appointment, please call your pharmacy* ? ?Follow-Up: ?At [facility], you and your health needs are our priority.  As part of our continuing mission to provide you with exceptional heart care, we have created designated Provider Care Teams.   These Care Teams include your primary Cardiologist (physician) and Advanced Practice Providers (APPs -  Physician Assistants and Nurse Practitioners) who all work together to provide you with the care you need, when you need it. ? ?We reco

## 2021-08-24 NOTE — Assessment & Plan Note (Addendum)
A Zio patch monitor has been ordered according to epic.  I do not see any results as of yet.  Our staff is working on tracking down. ?

## 2021-08-27 DIAGNOSIS — R002 Palpitations: Secondary | ICD-10-CM | POA: Diagnosis not present

## 2021-08-27 DIAGNOSIS — H34232 Retinal artery branch occlusion, left eye: Secondary | ICD-10-CM | POA: Diagnosis not present

## 2021-08-29 ENCOUNTER — Encounter: Payer: Self-pay | Admitting: Internal Medicine

## 2021-08-30 ENCOUNTER — Encounter: Payer: Self-pay | Admitting: Internal Medicine

## 2021-08-30 NOTE — Progress Notes (Signed)
Duplicate

## 2021-09-06 ENCOUNTER — Ambulatory Visit (INDEPENDENT_AMBULATORY_CARE_PROVIDER_SITE_OTHER): Payer: PPO | Admitting: Orthopedic Surgery

## 2021-09-06 ENCOUNTER — Telehealth: Payer: Self-pay | Admitting: Orthopedic Surgery

## 2021-09-06 DIAGNOSIS — L97919 Non-pressure chronic ulcer of unspecified part of right lower leg with unspecified severity: Secondary | ICD-10-CM

## 2021-09-06 DIAGNOSIS — L408 Other psoriasis: Secondary | ICD-10-CM | POA: Diagnosis not present

## 2021-09-06 DIAGNOSIS — I87331 Chronic venous hypertension (idiopathic) with ulcer and inflammation of right lower extremity: Secondary | ICD-10-CM | POA: Diagnosis not present

## 2021-09-06 DIAGNOSIS — L400 Psoriasis vulgaris: Secondary | ICD-10-CM | POA: Diagnosis not present

## 2021-09-06 DIAGNOSIS — L718 Other rosacea: Secondary | ICD-10-CM | POA: Diagnosis not present

## 2021-09-06 NOTE — Telephone Encounter (Signed)
Pt called stating she was just here for an appt for sore on her shin of left leg. She was instructed to go purchase a vive sock. Noticed that this has 50% wool and she is allergic to 100% wool. Per Dr. Sharol Given, this is alpaca wool, so she should have no problems with this. Pt informed. ?

## 2021-09-11 ENCOUNTER — Encounter: Payer: Self-pay | Admitting: Orthopedic Surgery

## 2021-09-11 NOTE — Progress Notes (Signed)
? ?Office Visit Note ?  ?Patient: Lisa Mcgee           ?Date of Birth: [birth year]           ?MRN: [id number] ?Visit Date: 09/06/2021 ?             ?Requested by: Binnie Rail, MD ?[place],  [place] [address] ?PCP: Binnie Rail, MD ? ?Chief Complaint  ?Patient presents with  ? Right Leg - Wound Check  ? ? ? ? ?HPI: ?Patient is a 76 year old woman who presents with a traumatic injury to her right leg.  She has chronic venous insufficiency.  She states she struck a bar that was in her yard.  This happened about 3 weeks ago she states she then went to the beach and soaked her leg in the ocean she feels like it is getting better she is using peroxide and iodine mixture. ? ?Assessment & Plan: ?Visit Diagnoses:  ?1. Chronic venous hypertension (idiopathic) with ulcer and inflammation of right lower extremity ([place])   ? ? ?Plan: Recommended a size medium knee-high compression stocking to be worn around-the-clock. ? ?Follow-Up Instructions: Return in about 4 weeks (around 10/04/2021).  ? ?Ortho Exam ? ?Patient is alert, oriented, no adenopathy, well-dressed, normal affect, normal respiratory effort. ?Examination patient has a good dorsalis pedis pulse she has chronic venous insufficiency with pitting edema.  There is a venous insufficiency traumatic ulcer on the right calf.  Her calf measures 32 cm in circumference.  There is no cellulitis no odor no drainage. ? ?Imaging: ?No results found. ?No images are attached to the encounter. ? ?Labs: ?Lab Results  ?Component Value Date  ? HGBA1C 6.1 10/16/2020  ? HGBA1C 6.0 11/18/2019  ? HGBA1C 6.0 03/08/2019  ? LABORGA  12/06/2016  ?  Multiple organisms present,each less than ?10,000 CFU/mL. ?These organisms,commonly found on external ?and internal genitalia,are considered colonizers. ?No further testing performed. ?  ? ? ? ?Lab Results  ?Component Value Date  ? ALBUMIN 4.4 10/16/2020  ? ALBUMIN 4.5 11/18/2019  ? ALBUMIN 4.6 11/17/2018  ? ? ?No results found for:  MG ?Lab Results  ?Component Value Date  ? VD25OH 54.74 10/16/2020  ? VD25OH 56.62 06/16/2014  ? ? ?No results found for: PREALBUMIN ? ?  Latest Ref Rng & Units 10/16/2020  ?  2:30 PM 11/18/2019  ?  8:43 AM 11/17/2018  ?  9:03 AM  ?CBC EXTENDED  ?WBC 4.0 - 10.5 K/uL 10.5   5.5   8.2    ?RBC 3.87 - 5.11 Mil/uL 4.36   4.40   4.61    ?Hemoglobin 12.0 - 15.0 g/dL 13.1   13.5   13.8    ?HCT 36.0 - 46.0 % 37.5   38.4   40.3    ?Platelets 150.0 - 400.0 K/uL 247.0   181.0   196.0    ?NEUT# 1.4 - 7.7 K/uL 8.3   2.9   4.8    ?Lymph# 0.7 - 4.0 K/uL 1.5   2.0   2.7    ? ? ? ?There is no height or weight on file to calculate BMI. ? ?Orders:  ?No orders of the defined types were placed in this encounter. ? ?No orders of the defined types were placed in this encounter. ? ? ? Procedures: ?No procedures performed ? ?Clinical Data: ?No additional findings. ? ?ROS: ? ?All other systems negative, except as noted in the HPI. ?Review of Systems ? ?Objective: ?Vital Signs:  There were no vitals taken for this visit. ? ?Specialty Comments:  ?No specialty comments available. ? ?PMFS History: ?Patient Active Problem List  ? Diagnosis Date Noted  ? Carotid artery stenosis 08/15/2021  ? Branch retinal artery occlusion, left eye 07/27/2021  ? Hyponatremia 11/20/2020  ? Constipation 10/16/2020  ? Fatigue 10/16/2020  ? Nausea 10/02/2020  ? Cataract 11/17/2018  ? Asymmetrical hearing loss of left ear 04/21/2018  ? Tinnitus of left ear 04/21/2018  ? Allergic rhinitis 03/14/2016  ? Pes cavus 04/11/2014  ? Vitamin D deficiency 08/04/2013  ? Osteopenia 04/17/2013  ? Rhinitis, non-allergic 05/26/2011  ? SYNCOPE 06/28/2008  ? Palpitations 06/28/2008  ? Hyperlipidemia 04/20/2008  ? History of colonic polyps 04/20/2008  ? Diabetes type 2, controlled ([place]) 05/28/2007  ? ?Past Medical History:  ?Diagnosis Date  ? Allergy   ? Arthritis   ? bil fingers  ? Cataract   ? bil cataracts  ? Diabetes mellitus without complication ([place])   ? diet controlled  ? Environmental  allergies   ? Ganglion cyst of left foot 06/22/2013  ? Hyperlipidemia   ? low HDL  ? Osteopenia   ?  ?Family History  ?Problem Relation Age of Onset  ? Allergies Mother   ? Diabetes Mother   ? Heart attack Father   ?     > 47  ? Diabetes Sister   ? Alzheimer's disease Brother   ? Colon cancer Maternal Grandfather   ? Colitis Maternal Grandfather   ?     died at 38 years old  ? Stroke Neg Hx   ? Cancer Neg Hx   ? Esophageal cancer Neg Hx   ? Rectal cancer Neg Hx   ? Stomach cancer Neg Hx   ?  ?Past Surgical History:  ?Procedure Laterality Date  ? COLONOSCOPY  2007  ? Adenomatous polyp  ? COLONOSCOPY  2014  ? neg  ? GANGLION CYST EXCISION Left   ? left foot  ? POLYPECTOMY    ? TONSILLECTOMY AND ADENOIDECTOMY  age 55  ? TRIGGER FINGER RELEASE Left 08/15/2016  ? ?Social History  ? ?Occupational History  ? Not on file  ?Tobacco Use  ? Smoking status: Never  ? Smokeless tobacco: Never  ?Vaping Use  ? Vaping Use: Never used  ?Substance and Sexual Activity  ? Alcohol use: No  ? Drug use: No  ? Sexual activity: Not on file  ? ? ? ? ? ?

## 2021-09-13 ENCOUNTER — Telehealth: Payer: Self-pay | Admitting: Orthopedic Surgery

## 2021-09-13 NOTE — Telephone Encounter (Signed)
Pt called requesting a call back. Pt has medical questions. Please call pt at [phone] ?

## 2021-09-13 NOTE — Telephone Encounter (Signed)
I called and lm on vm to advise to call back with specific questions and I can relay this to Dr. Sharol Given and call back with answers  ?

## 2021-09-14 ENCOUNTER — Ambulatory Visit (HOSPITAL_COMMUNITY): Payer: PPO | Attending: Cardiology

## 2021-09-14 DIAGNOSIS — R002 Palpitations: Secondary | ICD-10-CM | POA: Diagnosis not present

## 2021-09-14 DIAGNOSIS — H34232 Retinal artery branch occlusion, left eye: Secondary | ICD-10-CM | POA: Insufficient documentation

## 2021-09-14 DIAGNOSIS — I351 Nonrheumatic aortic (valve) insufficiency: Secondary | ICD-10-CM | POA: Diagnosis not present

## 2021-09-14 LAB — ECHOCARDIOGRAM COMPLETE
Area-P 1/2: 4.31 cm2
P 1/2 time: 340 msec
S' Lateral: 2.9 cm

## 2021-09-16 ENCOUNTER — Encounter: Payer: Self-pay | Admitting: Internal Medicine

## 2021-09-16 DIAGNOSIS — I351 Nonrheumatic aortic (valve) insufficiency: Secondary | ICD-10-CM | POA: Insufficient documentation

## 2021-09-16 DIAGNOSIS — I5189 Other ill-defined heart diseases: Secondary | ICD-10-CM | POA: Insufficient documentation

## 2021-09-17 ENCOUNTER — Telehealth: Payer: Self-pay | Admitting: Orthopedic Surgery

## 2021-09-17 NOTE — Telephone Encounter (Signed)
Patient called asked how often should she wash the compression stockings. Patient said she has a pair of them. Patient said she feel as if she is washing them to much. The number to contact patient is [phone]  ?

## 2021-09-18 NOTE — Telephone Encounter (Signed)
Called and lm on vm to advise that if she is not having any weeping or drainage she can wash every 2-3 days if she is then she can wash after wearing them. To call back a with questions.  ?

## 2021-09-28 ENCOUNTER — Ambulatory Visit (INDEPENDENT_AMBULATORY_CARE_PROVIDER_SITE_OTHER): Payer: PPO

## 2021-09-28 DIAGNOSIS — Z0000 Encounter for general adult medical examination without abnormal findings: Secondary | ICD-10-CM

## 2021-09-28 NOTE — Patient Instructions (Signed)
Lisa Mcgee , ?Thank you for taking time to come for your Medicare Wellness Visit. I appreciate your ongoing commitment to your health goals. Please review the following plan we discussed and let me know if I can assist you in the future.  ? ?Screening recommendations/referrals: ?Colonoscopy: 02/25/2018 ?Mammogram: 08/2021  per Dr. Matthew Saras  ?Bone Density: 08/2021  per Dr. Matthew Saras  ?Recommended yearly ophthalmology/optometry visit for glaucoma screening and checkup ?Recommended yearly dental visit for hygiene and checkup ? ?Vaccinations: ?Influenza vaccine: completed  ?Pneumococcal vaccine: completed  ?Tdap vaccine: 06/21/2018 ?Shingles vaccine: completed    ? ?Advanced directives: none  ? ?Conditions/risks identified: none  ? ?Next appointment: none  ? ? ?Preventive Care 77 Years and Older, Female ?Preventive care refers to lifestyle choices and visits with your health care provider that can promote health and wellness. ?What does preventive care include? ?A yearly physical exam. This is also called an annual well check. ?Dental exams once or twice a year. ?Routine eye exams. Ask your health care provider how often you should have your eyes checked. ?Personal lifestyle choices, including: ?Daily care of your teeth and gums. ?Regular physical activity. ?Eating a healthy diet. ?Avoiding tobacco and drug use. ?Limiting alcohol use. ?Practicing safe sex. ?Taking low-dose aspirin every day. ?Taking vitamin and mineral supplements as recommended by your health care provider. ?What happens during an annual well check? ?The services and screenings done by your health care provider during your annual well check will depend on your age, overall health, lifestyle risk factors, and family history of disease. ?Counseling  ?Your health care provider may ask you questions about your: ?Alcohol use. ?Tobacco use. ?Drug use. ?Emotional well-being. ?Home and relationship well-being. ?Sexual activity. ?Eating habits. ?History of  falls. ?Memory and ability to understand (cognition). ?Work and work Statistician. ?Reproductive health. ?Screening  ?You may have the following tests or measurements: ?Height, weight, and BMI. ?Blood pressure. ?Lipid and cholesterol levels. These may be checked every 5 years, or more frequently if you are over 74 years old. ?Skin check. ?Lung cancer screening. You may have this screening every year starting at age 73 if you have a 30-pack-year history of smoking and currently smoke or have quit within the past 15 years. ?Fecal occult blood test (FOBT) of the stool. You may have this test every year starting at age 28. ?Flexible sigmoidoscopy or colonoscopy. You may have a sigmoidoscopy every 5 years or a colonoscopy every 10 years starting at age 9. ?Hepatitis C blood test. ?Hepatitis B blood test. ?Sexually transmitted disease (STD) testing. ?Diabetes screening. This is done by checking your blood sugar (glucose) after you have not eaten for a while (fasting). You may have this done every 1-3 years. ?Bone density scan. This is done to screen for osteoporosis. You may have this done starting at age 12. ?Mammogram. This may be done every 1-2 years. Talk to your health care provider about how often you should have regular mammograms. ?Talk with your health care provider about your test results, treatment options, and if necessary, the need for more tests. ?Vaccines  ?Your health care provider may recommend certain vaccines, such as: ?Influenza vaccine. This is recommended every year. ?Tetanus, diphtheria, and acellular pertussis (Tdap, Td) vaccine. You may need a Td booster every 10 years. ?Zoster vaccine. You may need this after age 55. ?Pneumococcal 13-valent conjugate (PCV13) vaccine. One dose is recommended after age 72. ?Pneumococcal polysaccharide (PPSV23) vaccine. One dose is recommended after age 35. ?Talk to your health care provider about  which screenings and vaccines you need and how often you need  them. ?This information is not intended to replace advice given to you by your health care provider. Make sure you discuss any questions you have with your health care provider. ?Document Released: 06/23/2015 Document Revised: 02/14/2016 Document Reviewed: 03/28/2015 ?Elsevier Interactive Patient Education ? 2017 [place]. ? ?Fall Prevention in the Home ?Falls can cause injuries. They can happen to people of all ages. There are many things you can do to make your home safe and to help prevent falls. ?What can I do on the outside of my home? ?Regularly fix the edges of walkways and driveways and fix any cracks. ?Remove anything that might make you trip as you walk through a door, such as a raised step or threshold. ?Trim any bushes or trees on the path to your home. ?Use bright outdoor lighting. ?Clear any walking paths of anything that might make someone trip, such as rocks or tools. ?Regularly check to see if handrails are loose or broken. Make sure that both sides of any steps have handrails. ?Any raised decks and porches should have guardrails on the edges. ?Have any leaves, snow, or ice cleared regularly. ?Use sand or salt on walking paths during winter. ?Clean up any spills in your garage right away. This includes oil or grease spills. ?What can I do in the bathroom? ?Use night lights. ?Install grab bars by the toilet and in the tub and shower. Do not use towel bars as grab bars. ?Use non-skid mats or decals in the tub or shower. ?If you need to sit down in the shower, use a plastic, non-slip stool. ?Keep the floor dry. Clean up any water that spills on the floor as soon as it happens. ?Remove soap buildup in the tub or shower regularly. ?Attach bath mats securely with double-sided non-slip rug tape. ?Do not have throw rugs and other things on the floor that can make you trip. ?What can I do in the bedroom? ?Use night lights. ?Make sure that you have a light by your bed that is easy to reach. ?Do not use  any sheets or blankets that are too big for your bed. They should not hang down onto the floor. ?Have a firm chair that has side arms. You can use this for support while you get dressed. ?Do not have throw rugs and other things on the floor that can make you trip. ?What can I do in the kitchen? ?Clean up any spills right away. ?Avoid walking on wet floors. ?Keep items that you use a lot in easy-to-reach places. ?If you need to reach something above you, use a strong step stool that has a grab bar. ?Keep electrical cords out of the way. ?Do not use floor polish or wax that makes floors slippery. If you must use wax, use non-skid floor wax. ?Do not have throw rugs and other things on the floor that can make you trip. ?What can I do with my stairs? ?Do not leave any items on the stairs. ?Make sure that there are handrails on both sides of the stairs and use them. Fix handrails that are broken or loose. Make sure that handrails are as long as the stairways. ?Check any carpeting to make sure that it is firmly attached to the stairs. Fix any carpet that is loose or worn. ?Avoid having throw rugs at the top or bottom of the stairs. If you do have throw rugs, attach them to the floor with  carpet tape. ?Make sure that you have a light switch at the top of the stairs and the bottom of the stairs. If you do not have them, ask someone to add them for you. ?What else can I do to help prevent falls? ?Wear shoes that: ?Do not have high heels. ?Have rubber bottoms. ?Are comfortable and fit you well. ?Are closed at the toe. Do not wear sandals. ?If you use a stepladder: ?Make sure that it is fully opened. Do not climb a closed stepladder. ?Make sure that both sides of the stepladder are locked into place. ?Ask someone to hold it for you, if possible. ?Clearly mark and make sure that you can see: ?Any grab bars or handrails. ?First and last steps. ?Where the edge of each step is. ?Use tools that help you move around (mobility aids)  if they are needed. These include: ?Canes. ?Walkers. ?Scooters. ?Crutches. ?Turn on the lights when you go into a dark area. Replace any light bulbs as soon as they burn out. ?Set up your furniture so you have a clear p

## 2021-09-28 NOTE — Progress Notes (Signed)
? ?Subjective:  ? Lisa Mcgee is a 76 y.o. female who presents for Medicare Annual (Subsequent) preventive examination. ? ? ?I connected with Lisa Mcgee today by telephone and verified that I am speaking with the correct person using two identifiers. ?Location patient: home ?Location provider: work ?Persons participating in the virtual visit: patient, provider. ?  ?I discussed the limitations, risks, security and privacy concerns of performing an evaluation and management service by telephone and the availability of in person appointments. I also discussed with the patient that there may be a patient responsible charge related to this service. The patient expressed understanding and verbally consented to this telephonic visit.  ?  ?Interactive audio and video telecommunications were attempted between this provider and patient, however failed, due to patient having technical difficulties OR patient did not have access to video capability.  We continued and completed visit with audio only. ? ?  ?Review of Systems    ? ?Cardiac Risk Factors include: advanced age (>84mn, >>73women) ? ?   ?Objective:  ?  ?Today's Vitals  ? ?There is no height or weight on file to calculate BMI. ? ? ?  09/28/2021  ? 11:27 AM  ?Advanced Directives  ?Does Patient Have a Medical Advance Directive? No  ?Would patient like information on creating a medical advance directive? No - Patient declined  ? ? ?Current Medications (verified) ?Outpatient Encounter Medications as of 09/28/2021  ?Medication Sig  ? aspirin 81 MG EC tablet Take 1 tablet (81 mg total) by mouth daily. Swallow whole.  ? Calcium Carbonate-Vitamin D (CALCIUM 600 + D PO) Take 1 tablet by mouth 2 (two) times daily.  ? Cholecalciferol (VITAMIN D3) 2000 UNITS TABS Take 1 tablet by mouth 2 (two) times daily.  ? clobetasol cream (TEMOVATE) 09.48% Apply 1 application topically as needed.  ? COD LIVER OIL PO Take by mouth.  ? Coenzyme Q10 (COQ10 PO) Take 300 mg by mouth daily.  ?  fish oil-omega-3 fatty acids 1000 MG capsule Take 2 g by mouth daily.  ? Flaxseed, Linseed, (FLAX SEED OIL) 1000 MG CAPS Take 1 capsule by mouth 2 (two) times daily.  ? Garlic 1546MG TABS Take 1,000 mg by mouth 2 (two) times daily.  ? ibuprofen (ADVIL,MOTRIN) 200 MG tablet Take 200 mg by mouth. Take 3 pills prn  ? ketoconazole (NIZORAL) 2 % cream Apply 1 application topically 2 (two) times daily as needed for irritation.  ? ketoconazole (NIZORAL) 2 % shampoo Apply topically.  ? Multiple Vitamins-Minerals (CENTRUM SILVER 50+WOMEN PO) Take by mouth.  ? Multiple Vitamins-Minerals (HAIR/SKIN/NAILS PO) Take 1 tablet by mouth daily.   ? mupirocin ointment (BACTROBAN) 2 % Apply 1 application topically as needed.  ? NON FORMULARY Enhanced Energy MVI-Take 1 pill bid  ? pyridOXINE (VITAMIN B-6) 100 MG tablet Take 100 mg by mouth 2 (two) times daily.  ? RESTA SILVER GEL Apply topically.  ? triamcinolone (KENALOG) 0.025 % cream Apply 1 application topically 2 (two) times daily.  ? triamcinolone cream (KENALOG) 0.1 % Apply 1 application topically as needed.  ? vitamin B-12 (CYANOCOBALAMIN) 1000 MCG tablet Take 1,000 mcg by mouth daily.  ? zinc gluconate 50 MG tablet Take 50 mg by mouth 2 (two) times daily.  ? ?No facility-administered encounter medications on file as of 09/28/2021.  ? ? ?Allergies (verified) ?Atropine, Belladonna alkaloids, Doxycycline, Prevnar [pneumococcal 13-val conj vacc], Eggs or egg-derived products, Lorazepam, and Other  ? ?History: ?Past Medical History:  ?Diagnosis Date  ?  Allergy   ? Arthritis   ? bil fingers  ? Cataract   ? bil cataracts  ? Diabetes mellitus without complication ([place])   ? diet controlled  ? Environmental allergies   ? Ganglion cyst of left foot 06/22/2013  ? Hyperlipidemia   ? low HDL  ? Osteopenia   ? ?Past Surgical History:  ?Procedure Laterality Date  ? COLONOSCOPY  2007  ? Adenomatous polyp  ? COLONOSCOPY  2014  ? neg  ? GANGLION CYST EXCISION Left   ? left foot  ? POLYPECTOMY    ?  TONSILLECTOMY AND ADENOIDECTOMY  age 78  ? TRIGGER FINGER RELEASE Left 08/15/2016  ? ?Family History  ?Problem Relation Age of Onset  ? Allergies Mother   ? Diabetes Mother   ? Heart attack Father   ?     > 31  ? Diabetes Sister   ? Alzheimer's disease Brother   ? Colon cancer Maternal Grandfather   ? Colitis Maternal Grandfather   ?     died at 46 years old  ? Stroke Neg Hx   ? Cancer Neg Hx   ? Esophageal cancer Neg Hx   ? Rectal cancer Neg Hx   ? Stomach cancer Neg Hx   ? ?Social History  ? ?Socioeconomic History  ? Marital status: Widowed  ?  Spouse name: Not on file  ? Number of children: Not on file  ? Years of education: Not on file  ? Highest education level: Not on file  ?Occupational History  ? Not on file  ?Tobacco Use  ? Smoking status: Never  ? Smokeless tobacco: Never  ?Vaping Use  ? Vaping Use: Never used  ?Substance and Sexual Activity  ? Alcohol use: No  ? Drug use: No  ? Sexual activity: Not on file  ?Other Topics Concern  ? Not on file  ?Social History Narrative  ? Exercise:  Water aerobics twice a week  ? ?Social Determinants of Health  ? ?Financial Resource Strain: Low Risk   ? Difficulty of Paying Living Expenses: Not hard at all  ?Food Insecurity: No Food Insecurity  ? Worried About Charity fundraiser in the Last Year: Never true  ? Ran Out of Food in the Last Year: Never true  ?Transportation Needs: No Transportation Needs  ? Lack of Transportation (Medical): No  ? Lack of Transportation (Non-Medical): No  ?Physical Activity: Insufficiently Active  ? Days of Exercise per Week: 3 days  ? Minutes of Exercise per Session: 30 min  ?Stress: No Stress Concern Present  ? Feeling of Stress : Not at all  ?Social Connections: Moderately Integrated  ? Frequency of Communication with Friends and Family: Three times a week  ? Frequency of Social Gatherings with Friends and Family: Three times a week  ? Attends Religious Services: More than 4 times per year  ? Active Member of Clubs or Organizations: Yes   ? Attends Archivist Meetings: More than 4 times per year  ? Marital Status: Widowed  ? ? ?Tobacco Counseling ?Counseling given: Not Answered ? ? ?Clinical Intake: ? ?Pre-visit preparation completed: Yes ? ?Pain : No/denies pain ? ?  ? ?Nutritional Risks: None ?Diabetes: No ? ?How often do you need to have someone help you when you read instructions, pamphlets, or other written materials from your doctor or pharmacy?: 1 - Never ?What is the last grade level you completed in school?: High School ? ?Diabetic?no  ? ?Interpreter Needed?: No ? ?  Information entered by :: [email or web address] ? ? ?Activities of Daily Living ? ?  09/28/2021  ? 11:31 AM 10/16/2020  ?  1:45 PM  ?In your present state of health, do you have any difficulty performing the following activities:  ?Hearing? 0 0  ?Vision? 0 0  ?Difficulty concentrating or making decisions? 0 0  ?Walking or climbing stairs? 0 0  ?Dressing or bathing? 0 0  ?Doing errands, shopping? 0 0  ?Preparing Food and eating ? N   ?Using the Toilet? N   ?In the past six months, have you accidently leaked urine? N   ?Do you have problems with loss of bowel control? N   ?Managing your Medications? N   ?Managing your Finances? N   ?Housekeeping or managing your Housekeeping? N   ? ? ?Patient Care Team: ?Binnie Rail, MD as PCP - General (Internal Medicine) ? ?Indicate any recent Medical Services you may have received from other than [facility] providers in the past year (date may be approximate). ? ?   ?Assessment:  ? This is a routine wellness examination for Reona. ? ?Hearing/Vision screen ?Vision Screening - Comments:: Annual eye exams wears glasses  ? ?Dietary issues and exercise activities discussed: ?Current Exercise Habits: Home exercise routine, Type of exercise: walking, Time (Minutes): 30, Frequency (Times/Week): 3, Weekly Exercise (Minutes/Week): 90, Intensity: Mild, Exercise limited by: None identified ? ? Goals Addressed   ?None ?  ? ?Depression Screen ? ?  09/28/2021  ?  11:28 AM 07/27/2021  ? 10:00 AM 11/20/2020  ?  8:15 AM 11/18/2019  ?  8:25 AM 11/17/2018  ?  8:16 AM 11/11/2017  ?  8:04 AM 11/07/2016  ?  8:04 AM  ?PHQ 2/9 Scores  ?PHQ - 2 Score 0 0 0 0 0 0 0  ?PHQ- 9 Score   0

## 2021-10-01 LAB — HM MAMMOGRAPHY

## 2021-10-01 LAB — HM DEXA SCAN

## 2021-10-05 ENCOUNTER — Encounter: Payer: Self-pay | Admitting: Internal Medicine

## 2021-10-05 NOTE — Progress Notes (Signed)
Outside notes received. Information abstracted. Notes sent to scan.  

## 2021-10-08 ENCOUNTER — Ambulatory Visit: Payer: PPO | Admitting: Orthopedic Surgery

## 2021-10-08 DIAGNOSIS — I87331 Chronic venous hypertension (idiopathic) with ulcer and inflammation of right lower extremity: Secondary | ICD-10-CM

## 2021-11-05 ENCOUNTER — Encounter: Payer: Self-pay | Admitting: Orthopedic Surgery

## 2021-11-05 NOTE — Progress Notes (Signed)
Office Visit Note   Patient: Lisa Mcgee           Date of Birth: 04/27/1946           MRN: [id number] Visit Date: 10/08/2021              Requested by: Binnie Rail, MD [place],   [id number] PCP: Binnie Rail, MD  Chief Complaint  Patient presents with   Right Leg - Wound Check      HPI: Patient is a 76 year old woman who presents for evaluation for right leg venous insufficiency with inflammation.  Patient is currently wearing knee-high compression stockings 24 hours a day.  She states she has noticed a big difference the day after she started wearing them.  Assessment & Plan: Visit Diagnoses:  1. Chronic venous hypertension (idiopathic) with ulcer and inflammation of right lower extremity (HCC)     Plan: Patient will continue with her compression stockings.  Follow-Up Instructions: Return if symptoms worsen or fail to improve.   Ortho Exam  Patient is alert, oriented, no adenopathy, well-dressed, normal affect, normal respiratory effort. Examination the venous ulcers are completely healed the swelling is decreased there is no redness no cellulitis no signs of infection.  Imaging: No results found. No images are attached to the encounter.  Labs: Lab Results  Component Value Date   HGBA1C 6.1 10/16/2020   HGBA1C 6.0 11/18/2019   HGBA1C 6.0 03/08/2019   LABORGA  12/06/2016    Multiple organisms present,each less than 10,000 CFU/mL. These organisms,commonly found on external and internal genitalia,are considered colonizers. No further testing performed.      Lab Results  Component Value Date   ALBUMIN 4.4 10/16/2020   ALBUMIN 4.5 11/18/2019   ALBUMIN 4.6 11/17/2018    No results found for: MG Lab Results  Component Value Date   VD25OH 54.74 10/16/2020   VD25OH 56.62 06/16/2014    No results found for: PREALBUMIN    Latest Ref Rng & Units 10/16/2020    2:30 PM 11/18/2019    8:43 AM 11/17/2018    9:03 AM  CBC EXTENDED   WBC 4.0 - 10.5 K/uL 10.5   5.5   8.2    RBC 3.87 - 5.11 Mil/uL 4.36   4.40   4.61    Hemoglobin 12.0 - 15.0 g/dL 13.1   13.5   13.8    HCT 36.0 - 46.0 % 37.5   38.4   40.3    Platelets 150.0 - 400.0 K/uL 247.0   181.0   196.0    NEUT# 1.4 - 7.7 K/uL 8.3   2.9   4.8    Lymph# 0.7 - 4.0 K/uL 1.5   2.0   2.7       There is no height or weight on file to calculate BMI.  Orders:  No orders of the defined types were placed in this encounter.  No orders of the defined types were placed in this encounter.    Procedures: No procedures performed  Clinical Data: No additional findings.  ROS:  All other systems negative, except as noted in the HPI. Review of Systems  Objective: Vital Signs: There were no vitals taken for this visit.  Specialty Comments:  No specialty comments available.  PMFS History: Patient Active Problem List   Diagnosis Date Noted   Diastolic dysfunction, grade 2  09/16/2021   Mild aortic regurgitation 09/16/2021   Carotid artery stenosis 08/15/2021   Branch retinal  artery occlusion, left eye 07/27/2021   Hyponatremia 11/20/2020   Constipation 10/16/2020   Fatigue 10/16/2020   Nausea 10/02/2020   Cataract 11/17/2018   Asymmetrical hearing loss of left ear 04/21/2018   Tinnitus of left ear 04/21/2018   Allergic rhinitis 03/14/2016   Pes cavus 04/11/2014   Vitamin D deficiency 08/04/2013   Osteopenia 04/17/2013   Rhinitis, non-allergic 05/26/2011   SYNCOPE 06/28/2008   Palpitations 06/28/2008   Hyperlipidemia 04/20/2008   History of colonic polyps 04/20/2008   Diabetes type 2, controlled ([place]) 05/28/2007   Past Medical History:  Diagnosis Date   Allergy    Arthritis    bil fingers   Cataract    bil cataracts   Diabetes mellitus without complication (HCC)    diet controlled   Environmental allergies    Ganglion cyst of left foot 06/22/2013   Hyperlipidemia    low HDL   Osteopenia     Family History  Problem Relation Age of Onset    Allergies Mother    Diabetes Mother    Heart attack Father        > 60   Diabetes Sister    Alzheimer's disease Brother    Colon cancer Maternal Grandfather    Colitis Maternal Grandfather        died at 34 years old   Stroke Neg Hx    Cancer Neg Hx    Esophageal cancer Neg Hx    Rectal cancer Neg Hx    Stomach cancer Neg Hx     Past Surgical History:  Procedure Laterality Date   COLONOSCOPY  2007   Adenomatous polyp   COLONOSCOPY  2014   neg   GANGLION CYST EXCISION Left    left foot   POLYPECTOMY     TONSILLECTOMY AND ADENOIDECTOMY  age 69   TRIGGER FINGER RELEASE Left 08/15/2016   Social History   Occupational History   Not on file  Tobacco Use   Smoking status: Never   Smokeless tobacco: Never  Vaping Use   Vaping Use: Never used  Substance and Sexual Activity   Alcohol use: No   Drug use: No   Sexual activity: Not on file

## 2021-11-14 DIAGNOSIS — H35363 Drusen (degenerative) of macula, bilateral: Secondary | ICD-10-CM | POA: Diagnosis not present

## 2021-11-14 DIAGNOSIS — H2513 Age-related nuclear cataract, bilateral: Secondary | ICD-10-CM | POA: Diagnosis not present

## 2021-11-14 DIAGNOSIS — H43813 Vitreous degeneration, bilateral: Secondary | ICD-10-CM | POA: Diagnosis not present

## 2021-11-14 DIAGNOSIS — H348322 Tributary (branch) retinal vein occlusion, left eye, stable: Secondary | ICD-10-CM | POA: Diagnosis not present

## 2021-11-19 DIAGNOSIS — M79645 Pain in left finger(s): Secondary | ICD-10-CM | POA: Diagnosis not present

## 2021-11-19 DIAGNOSIS — M65342 Trigger finger, left ring finger: Secondary | ICD-10-CM | POA: Diagnosis not present

## 2021-11-19 DIAGNOSIS — M7541 Impingement syndrome of right shoulder: Secondary | ICD-10-CM | POA: Diagnosis not present

## 2021-11-20 ENCOUNTER — Encounter: Payer: Self-pay | Admitting: Internal Medicine

## 2021-11-20 NOTE — Progress Notes (Unsigned)
Subjective:    Patient ID: Lisa Mcgee, female    DOB: [birth year], 76 y.o.   MRN: [id number]      HPI [name] is here for a Physical exam.   She has cold symptoms.  It started 1-2 days ago.  She has a mouth sore, drainage, ear clogged, congestion, sinus pressure. She has been using colgate mouth rinse.  She is taking zyrtec and mucinex. Taking tylenol and ibuprofen.  She has a history of sinus infections.     Medications and allergies reviewed with patient and updated if appropriate.  Current Outpatient Medications on File Prior to Visit  Medication Sig Dispense Refill   aspirin 81 MG EC tablet Take 1 tablet (81 mg total) by mouth daily. Swallow whole. 30 tablet 12   Calcium Carbonate-Vitamin D (CALCIUM 600 + D PO) Take 1 tablet by mouth 2 (two) times daily.     Cholecalciferol (VITAMIN D3) 2000 UNITS TABS Take 1 tablet by mouth 2 (two) times daily.     clobetasol cream (TEMOVATE) 5.00 % Apply 1 application topically as needed.     COD LIVER OIL PO Take by mouth.     Coenzyme Q10 (COQ10 PO) Take 300 mg by mouth daily.     fish oil-omega-3 fatty acids 1000 MG capsule Take 2 g by mouth daily.     Flaxseed, Linseed, (FLAX SEED OIL) 1000 MG CAPS Take 1 capsule by mouth 2 (two) times daily.     Garlic 938 MG TABS Take 1,000 mg by mouth 2 (two) times daily.     ibuprofen (ADVIL,MOTRIN) 200 MG tablet Take 200 mg by mouth. Take 3 pills prn     ketoconazole (NIZORAL) 2 % cream Apply 1 application topically 2 (two) times daily as needed for irritation. 15 g 0   ketoconazole (NIZORAL) 2 % shampoo Apply topically.     Multiple Vitamins-Minerals (CENTRUM SILVER 50+WOMEN PO) Take by mouth.     Multiple Vitamins-Minerals (HAIR/SKIN/NAILS PO) Take 1 tablet by mouth daily.      mupirocin ointment (BACTROBAN) 2 % Apply 1 application topically as needed.     NON FORMULARY Enhanced Energy MVI-Take 1 pill bid     pyridOXINE (VITAMIN B-6) 100 MG tablet Take 100 mg by mouth 2 (two) times daily.      RESTA SILVER GEL Apply topically.     triamcinolone (KENALOG) 0.025 % cream Apply 1 application topically 2 (two) times daily.     triamcinolone cream (KENALOG) 0.1 % Apply 1 application topically as needed.     vitamin B-12 (CYANOCOBALAMIN) 1000 MCG tablet Take 1,000 mcg by mouth daily.     zinc gluconate 50 MG tablet Take 50 mg by mouth 2 (two) times daily.     No current facility-administered medications on file prior to visit.    Review of Systems  Constitutional:  Negative for fever.  HENT:  Positive for congestion, mouth sores, postnasal drip and sinus pressure. Negative for sore throat (scratchy).   Respiratory:  Negative for cough, shortness of breath and wheezing.   Cardiovascular:  Negative for chest pain, palpitations and leg swelling.  Gastrointestinal:  Negative for abdominal pain, blood in stool, constipation, diarrhea and nausea.  Genitourinary:  Negative for dysuria.  Musculoskeletal:  Positive for arthralgias (mild). Negative for back pain.  Skin:  Negative for rash.  Neurological:  Positive for headaches (with current cold). Negative for light-headedness.  Psychiatric/Behavioral:  Negative for dysphoric mood. The patient is not nervous/anxious.  Objective:   Vitals:   11/21/21 0749  BP: (!) 142/78  Pulse: 70  Temp: 98 F (36.7 C)  SpO2: 99%   Filed Weights   11/21/21 0749  Weight: 140 lb 12.8 oz (63.9 kg)   Body mass index is 22.73 kg/m.  BP Readings from Last 3 Encounters:  11/21/21 (!) 142/78  08/24/21 140/70  07/27/21 122/60    Wt Readings from Last 3 Encounters:  11/21/21 140 lb 12.8 oz (63.9 kg)  08/24/21 139 lb 12.8 oz (63.4 kg)  07/27/21 140 lb (63.5 kg)       Physical Exam Constitutional: She appears well-developed and well-nourished. No distress.  HENT:  Head: Normocephalic and atraumatic.  Right Ear: External ear normal. Normal ear canal and TM Left Ear: External ear normal.  Normal ear canal and TM Mouth/Throat:  Oropharynx is clear and moist.  Eyes: Conjunctivae normal.  Neck: Neck supple. No tracheal deviation present. No thyromegaly present.  No carotid bruit  Cardiovascular: Normal rate, regular rhythm and normal heart sounds.   No murmur heard.  No edema. Pulmonary/Chest: Effort normal and breath sounds normal. No respiratory distress. She has no wheezes. She has no rales.  Breast: deferred   Abdominal: Soft. She exhibits no distension. There is no tenderness.  Lymphadenopathy: She has no cervical adenopathy.  Skin: Skin is warm and dry. She is not diaphoretic.  Psychiatric: She has a normal mood and affect. Her behavior is normal.    Diabetic Foot Exam - Simple   Simple Foot Form Diabetic Foot exam was performed with the following findings: Yes   Visual Inspection No deformities, no ulcerations, no other skin breakdown bilaterally: Yes Sensation Testing Intact to touch and monofilament testing bilaterally: Yes Pulse Check Posterior Tibialis and Dorsalis pulse intact bilaterally: Yes Comments       Lab Results  Component Value Date   WBC 10.5 10/16/2020   HGB 13.1 10/16/2020   HCT 37.5 10/16/2020   PLT 247.0 10/16/2020   GLUCOSE 117 (H) 11/20/2020   CHOL 137 11/20/2020   TRIG 41.0 11/20/2020   HDL 63.60 11/20/2020   LDLCALC 65 11/20/2020   ALT 14 10/16/2020   AST 20 10/16/2020   NA 139 11/20/2020   K 4.4 11/20/2020   CL 104 11/20/2020   CREATININE 0.82 11/20/2020   BUN 11 11/20/2020   CO2 28 11/20/2020   TSH 0.39 10/16/2020   HGBA1C 6.1 10/16/2020   MICROALBUR 0.9 11/20/2020         Assessment & Plan:   Physical exam: Screening blood work  ordered Exercise regular Weight normal Substance abuse  none   Reviewed recommended immunizations.   Health Maintenance  Topic Date Due   COVID-19 Vaccine (1) Never done   HEMOGLOBIN A1C  04/18/2021   FOOT EXAM  11/20/2021   URINE MICROALBUMIN  11/20/2021   INFLUENZA VACCINE  01/08/2022   OPHTHALMOLOGY EXAM   07/25/2022   COLONOSCOPY (Pts 45-42yr Insurance coverage will need to be confirmed)  02/26/2023   MAMMOGRAM  10/02/2023   DEXA SCAN  10/01/2024   TETANUS/TDAP  12/09/2028   Pneumonia Vaccine 76 Years old  Completed   Hepatitis C Screening  Completed   Zoster Vaccines- Shingrix  Completed   HPV VACCINES  Aged Out          See Problem List for Assessment and Plan of chronic medical problems.

## 2021-11-20 NOTE — Patient Instructions (Signed)
Blood work was ordered.     Medications changes include :   amoxicillin three times a day - try to wait a couple of day before taking it     Return in about 1 year (around 11/22/2022) for Physical Exam.   Health Maintenance, Female Adopting a healthy lifestyle and getting preventive care are important in promoting health and wellness. Ask your health care provider about: The right schedule for you to have regular tests and exams. Things you can do on your own to prevent diseases and keep yourself healthy. What should I know about diet, weight, and exercise? Eat a healthy diet  Eat a diet that includes plenty of vegetables, fruits, low-fat dairy products, and lean protein. Do not eat a lot of foods that are high in solid fats, added sugars, or sodium. Maintain a healthy weight Body mass index (BMI) is used to identify weight problems. It estimates body fat based on height and weight. Your health care provider can help determine your BMI and help you achieve or maintain a healthy weight. Get regular exercise Get regular exercise. This is one of the most important things you can do for your health. Most adults should: Exercise for at least 150 minutes each week. The exercise should increase your heart rate and make you sweat (moderate-intensity exercise). Do strengthening exercises at least twice a week. This is in addition to the moderate-intensity exercise. Spend less time sitting. Even light physical activity can be beneficial. Watch cholesterol and blood lipids Have your blood tested for lipids and cholesterol at 74 years of age, then have this test every 5 years. Have your cholesterol levels checked more often if: Your lipid or cholesterol levels are high. You are older than 74 years of age. You are at high risk for heart disease. What should I know about cancer screening? Depending on your health history and family history, you may need to have cancer screening at various  ages. This may include screening for: Breast cancer. Cervical cancer. Colorectal cancer. Skin cancer. Lung cancer. What should I know about heart disease, diabetes, and high blood pressure? Blood pressure and heart disease High blood pressure causes heart disease and increases the risk of stroke. This is more likely to develop in people who have high blood pressure readings or are overweight. Have your blood pressure checked: Every 3-5 years if you are 65-98 years of age. Every year if you are 39 years old or older. Diabetes Have regular diabetes screenings. This checks your fasting blood sugar level. Have the screening done: Once every three years after age 33 if you are at a normal weight and have a low risk for diabetes. More often and at a younger age if you are overweight or have a high risk for diabetes. What should I know about preventing infection? Hepatitis B If you have a higher risk for hepatitis B, you should be screened for this virus. Talk with your health care provider to find out if you are at risk for hepatitis B infection. Hepatitis C Testing is recommended for: Everyone born from [birth year] through 1963. Anyone with known risk factors for hepatitis C. Sexually transmitted infections (STIs) Get screened for STIs, including gonorrhea and chlamydia, if: You are sexually active and are younger than 74 years of age. You are older than 74 years of age and your health care provider tells you that you are at risk for this type of infection. Your sexual activity has changed since you were  last screened, and you are at increased risk for chlamydia or gonorrhea. Ask your health care provider if you are at risk. Ask your health care provider about whether you are at high risk for HIV. Your health care provider may recommend a prescription medicine to help prevent HIV infection. If you choose to take medicine to prevent HIV, you should first get tested for HIV. You should then be tested  every 3 months for as long as you are taking the medicine. Pregnancy If you are about to stop having your period (premenopausal) and you may become pregnant, seek counseling before you get pregnant. Take 400 to 800 micrograms (mcg) of folic acid every day if you become pregnant. Ask for birth control (contraception) if you want to prevent pregnancy. Osteoporosis and menopause Osteoporosis is a disease in which the bones lose minerals and strength with aging. This can result in bone fractures. If you are 75 years old or older, or if you are at risk for osteoporosis and fractures, ask your health care provider if you should: Be screened for bone loss. Take a calcium or vitamin D supplement to lower your risk of fractures. Be given hormone replacement therapy (HRT) to treat symptoms of menopause. Follow these instructions at home: Alcohol use Do not drink alcohol if: Your health care provider tells you not to drink. You are pregnant, may be pregnant, or are planning to become pregnant. If you drink alcohol: Limit how much you have to: 0-1 drink a day. Know how much alcohol is in your drink. In the [place] one drink equals one 12 oz bottle of beer (355 mL), one 5 oz glass of wine (148 mL), or one 1 oz glass of hard liquor (44 mL). Lifestyle Do not use any products that contain nicotine or tobacco. These products include cigarettes, chewing tobacco, and vaping devices, such as e-cigarettes. If you need help quitting, ask your health care provider. Do not use street drugs. Do not share needles. Ask your health care provider for help if you need support or information about quitting drugs. General instructions Schedule regular health, dental, and eye exams. Stay current with your vaccines. Tell your health care provider if: You often feel depressed. You have ever been abused or do not feel safe at home. Summary Adopting a healthy lifestyle and getting preventive care are important in promoting  health and wellness. Follow your health care provider's instructions about healthy diet, exercising, and getting tested or screened for diseases. Follow your health care provider's instructions on monitoring your cholesterol and blood pressure. This information is not intended to replace advice given to you by your health care provider. Make sure you discuss any questions you have with your health care provider. Document Revised: 10/16/2020 Document Reviewed: 10/16/2020 Elsevier Patient Education  [place].

## 2021-11-21 ENCOUNTER — Ambulatory Visit (INDEPENDENT_AMBULATORY_CARE_PROVIDER_SITE_OTHER): Payer: PPO | Admitting: Internal Medicine

## 2021-11-21 VITALS — BP 142/78 | HR 70 | Temp 98.0°F | Ht 66.0 in | Wt 140.8 lb

## 2021-11-21 DIAGNOSIS — E559 Vitamin D deficiency, unspecified: Secondary | ICD-10-CM | POA: Diagnosis not present

## 2021-11-21 DIAGNOSIS — J0100 Acute maxillary sinusitis, unspecified: Secondary | ICD-10-CM

## 2021-11-21 DIAGNOSIS — Z0000 Encounter for general adult medical examination without abnormal findings: Secondary | ICD-10-CM | POA: Diagnosis not present

## 2021-11-21 DIAGNOSIS — E782 Mixed hyperlipidemia: Secondary | ICD-10-CM

## 2021-11-21 DIAGNOSIS — E119 Type 2 diabetes mellitus without complications: Secondary | ICD-10-CM

## 2021-11-21 LAB — MICROALBUMIN / CREATININE URINE RATIO
Creatinine,U: 30.8 mg/dL
Microalb Creat Ratio: 2.3 mg/g (ref 0.0–30.0)
Microalb, Ur: <0.7 mg/dL (ref 0.0–1.9)

## 2021-11-21 LAB — CBC WITH DIFFERENTIAL/PLATELET
Basophils Absolute: 0 10*3/uL (ref 0.0–0.1)
Basophils Relative: 0.3 % (ref 0.0–3.0)
Eosinophils Absolute: 0.1 10*3/uL (ref 0.0–0.7)
Eosinophils Relative: 0.5 % (ref 0.0–5.0)
HCT: 35.9 % — ABNORMAL LOW (ref 36.0–46.0)
Hemoglobin: 12.5 g/dL (ref 12.0–15.0)
Lymphocytes Relative: 24.3 % (ref 12.0–46.0)
Lymphs Abs: 2.7 10*3/uL (ref 0.7–4.0)
MCHC: 34.8 g/dL (ref 30.0–36.0)
MCV: 87.4 fl (ref 78.0–100.0)
Monocytes Absolute: 0.7 10*3/uL (ref 0.1–1.0)
Monocytes Relative: 6.3 % (ref 3.0–12.0)
Neutro Abs: 7.5 10*3/uL (ref 1.4–7.7)
Neutrophils Relative %: 68.6 % (ref 43.0–77.0)
Platelets: 172 10*3/uL (ref 150.0–400.0)
RBC: 4.1 Mil/uL (ref 3.87–5.11)
RDW: 13.3 % (ref 11.5–15.5)
WBC: 11 10*3/uL — ABNORMAL HIGH (ref 4.0–10.5)

## 2021-11-21 LAB — COMPREHENSIVE METABOLIC PANEL
ALT: 16 U/L (ref 0–35)
AST: 25 U/L (ref 0–37)
Albumin: 4.2 g/dL (ref 3.5–5.2)
Alkaline Phosphatase: 63 U/L (ref 39–117)
BUN: 16 mg/dL (ref 6–23)
CO2: 26 mEq/L (ref 19–32)
Calcium: 9 mg/dL (ref 8.4–10.5)
Chloride: 97 mEq/L (ref 96–112)
Creatinine, Ser: 0.81 mg/dL (ref 0.40–1.20)
GFR: 71.88 mL/min (ref >60.00)
Glucose, Bld: 117 mg/dL — ABNORMAL HIGH (ref 70–99)
Potassium: 3.8 mEq/L (ref 3.5–5.1)
Sodium: 130 mEq/L — ABNORMAL LOW (ref 135–145)
Total Bilirubin: 0.4 mg/dL (ref 0.2–1.2)
Total Protein: 6.9 g/dL (ref 6.0–8.3)

## 2021-11-21 LAB — HEMOGLOBIN A1C: Hgb A1c MFr Bld: 6 % (ref 4.6–6.5)

## 2021-11-21 LAB — VITAMIN D 25 HYDROXY (VIT D DEFICIENCY, FRACTURES): VITD: 69.54 ng/mL (ref 30.00–100.00)

## 2021-11-21 LAB — LIPID PANEL
Cholesterol: 126 mg/dL (ref 0–200)
HDL: 65.6 mg/dL (ref >39.00)
LDL Cholesterol: 50 mg/dL (ref 0–99)
NonHDL: 60.43
Total CHOL/HDL Ratio: 2
Triglycerides: 52 mg/dL (ref 0.0–149.0)
VLDL: 10.4 mg/dL (ref 0.0–40.0)

## 2021-11-21 MED ORDER — AMOXICILLIN 500 MG PO CAPS
500.0000 mg | ORAL_CAPSULE | Freq: Three times a day (TID) | ORAL | 0 refills | Status: AC
Start: 2021-11-21 — End: 2021-12-01

## 2021-11-21 NOTE — Assessment & Plan Note (Signed)
Chronic Taking vitamin D daily Check vitamin D level  

## 2021-11-21 NOTE — Assessment & Plan Note (Signed)
Acute Likely bacterial  Start amoxicillin 500 TID x 10 day otc cold medications Rest, fluid Call if no improvement

## 2021-11-21 NOTE — Assessment & Plan Note (Signed)
Chronic Sugars have been very well controlled with diet and lifestyle Continue diabetic diet, regular exercise Check A1c, urine microalbumin

## 2021-11-21 NOTE — Assessment & Plan Note (Signed)
Chronic Regular exercise and healthy diet encouraged Check lipid panel, CMP Continue lifestyle control 

## 2022-01-10 DIAGNOSIS — Z4789 Encounter for other orthopedic aftercare: Secondary | ICD-10-CM | POA: Diagnosis not present

## 2022-01-10 DIAGNOSIS — M65342 Trigger finger, left ring finger: Secondary | ICD-10-CM | POA: Diagnosis not present

## 2022-01-15 ENCOUNTER — Encounter: Payer: Self-pay | Admitting: Internal Medicine

## 2022-01-15 NOTE — Progress Notes (Unsigned)
    Subjective:    Patient ID: Lisa Mcgee, female    DOB: 16-Apr-1946, 76 y.o.   MRN: [id number]      HPI Lisa Mcgee is here for No chief complaint on file.    She needs paperwork filled out to be a foster parent.     Medications and allergies reviewed with patient and updated if appropriate.  Current Outpatient Medications on File Prior to Visit  Medication Sig Dispense Refill   aspirin 81 MG EC tablet Take 1 tablet (81 mg total) by mouth daily. Swallow whole. 30 tablet 12   Calcium Carbonate-Vitamin D (CALCIUM 600 + D PO) Take 1 tablet by mouth 2 (two) times daily.     Cholecalciferol (VITAMIN D3) 2000 UNITS TABS Take 1 tablet by mouth 2 (two) times daily.     clobetasol cream (TEMOVATE) 4.09 % Apply 1 application topically as needed.     COD LIVER OIL PO Take by mouth.     Coenzyme Q10 (COQ10 PO) Take 300 mg by mouth daily.     fish oil-omega-3 fatty acids 1000 MG capsule Take 2 g by mouth daily.     Flaxseed, Linseed, (FLAX SEED OIL) 1000 MG CAPS Take 1 capsule by mouth 2 (two) times daily.     Garlic 811 MG TABS Take 1,000 mg by mouth 2 (two) times daily.     ibuprofen (ADVIL,MOTRIN) 200 MG tablet Take 200 mg by mouth. Take 3 pills prn     ketoconazole (NIZORAL) 2 % cream Apply 1 application topically 2 (two) times daily as needed for irritation. 15 g 0   ketoconazole (NIZORAL) 2 % shampoo Apply topically.     Multiple Vitamins-Minerals (CENTRUM SILVER 50+WOMEN PO) Take by mouth.     Multiple Vitamins-Minerals (HAIR/SKIN/NAILS PO) Take 1 tablet by mouth daily.      mupirocin ointment (BACTROBAN) 2 % Apply 1 application topically as needed.     NON FORMULARY [employer] MVI-Take 1 pill bid     pyridOXINE (VITAMIN B-6) 100 MG tablet Take 100 mg by mouth 2 (two) times daily.     RESTA SILVER GEL Apply topically.     triamcinolone (KENALOG) 0.025 % cream Apply 1 application topically 2 (two) times daily.     triamcinolone cream (KENALOG) 0.1 % Apply 1 application  topically as needed.     vitamin B-12 (CYANOCOBALAMIN) 1000 MCG tablet Take 1,000 mcg by mouth daily.     zinc gluconate 50 MG tablet Take 50 mg by mouth 2 (two) times daily.     No current facility-administered medications on file prior to visit.    Review of Systems     Objective:  There were no vitals filed for this visit. BP Readings from Last 3 Encounters:  11/21/21 (!) 142/78  08/24/21 140/70  07/27/21 122/60   Wt Readings from Last 3 Encounters:  11/21/21 140 lb 12.8 oz (63.9 kg)  08/24/21 139 lb 12.8 oz (63.4 kg)  07/27/21 140 lb (63.5 kg)   There is no height or weight on file to calculate BMI.    Physical Exam         Assessment & Plan:    See Problem List for Assessment and Plan of chronic medical problems.

## 2022-01-16 ENCOUNTER — Ambulatory Visit (INDEPENDENT_AMBULATORY_CARE_PROVIDER_SITE_OTHER): Payer: PPO | Admitting: Internal Medicine

## 2022-01-16 DIAGNOSIS — E782 Mixed hyperlipidemia: Secondary | ICD-10-CM

## 2022-01-16 DIAGNOSIS — E119 Type 2 diabetes mellitus without complications: Secondary | ICD-10-CM | POA: Diagnosis not present

## 2022-01-16 NOTE — Assessment & Plan Note (Signed)
Chronic Diet controlled Sugars have been very well-controlled Continue diabetic diet, regular exercise

## 2022-01-16 NOTE — Assessment & Plan Note (Signed)
Chronic Lifestyle controlled Continue healthy diet, exercise

## 2022-01-23 ENCOUNTER — Telehealth: Payer: Self-pay

## 2022-01-23 NOTE — Telephone Encounter (Signed)
Forms emailed and copy sent to [facility] per patient request.  [facility] [address] [place], [place] [address]  [email or web address]

## 2022-01-24 DIAGNOSIS — M25642 Stiffness of left hand, not elsewhere classified: Secondary | ICD-10-CM | POA: Diagnosis not present

## 2022-02-18 ENCOUNTER — Ambulatory Visit: Payer: PPO | Admitting: Orthopedic Surgery

## 2022-02-18 DIAGNOSIS — L97921 Non-pressure chronic ulcer of unspecified part of left lower leg limited to breakdown of skin: Secondary | ICD-10-CM | POA: Diagnosis not present

## 2022-02-19 ENCOUNTER — Encounter: Payer: Self-pay | Admitting: Orthopedic Surgery

## 2022-02-19 DIAGNOSIS — H00021 Hordeolum internum right upper eyelid: Secondary | ICD-10-CM | POA: Diagnosis not present

## 2022-02-19 NOTE — Progress Notes (Signed)
Office Visit Note   Patient: Lisa Mcgee           Date of Birth: 06-14-1945           MRN: [id number] Visit Date: 02/18/2022              Requested by: Binnie Rail, MD [place],  [place] [id number] PCP: Binnie Rail, MD  Chief Complaint  Patient presents with   Left Leg - Wound Check    left leg// scraped front of it and sock is stuck to it      HPI: Patient is a 76 year old woman who sustained blunt trauma to her left leg scraping the skin and off the tibial crest.  Patient is 4 days out from her injury.  Assessment & Plan: Visit Diagnoses: No diagnosis found.  Plan: Recommend patient continue with the knee-high compression sock wear this around-the-clock change daily to wash with soap and water.  Follow-Up Instructions: Return in about 3 weeks (around 03/11/2022).   Ortho Exam  Patient is alert, oriented, no adenopathy, well-dressed, normal affect, normal respiratory effort. Examination patient has venous insufficiency of the left lower extremity she has a good dorsalis pedis pulse.  She is currently wearing a compression sock.  There is good healthy granulation tissue on the traumatic ulcer to the left leg.  The largest ulcer is 2 cm in diameter.  Imaging: No results found. No images are attached to the encounter.  Labs: Lab Results  Component Value Date   HGBA1C 6.0 11/21/2021   HGBA1C 6.1 10/16/2020   HGBA1C 6.0 11/18/2019   LABORGA  12/06/2016    Multiple organisms present,each less than 10,000 CFU/mL. These organisms,commonly found on external and internal genitalia,are considered colonizers. No further testing performed.      Lab Results  Component Value Date   ALBUMIN 4.2 11/21/2021   ALBUMIN 4.4 10/16/2020   ALBUMIN 4.5 11/18/2019    No results found for: "MG" Lab Results  Component Value Date   VD25OH 69.54 11/21/2021   VD25OH 54.74 10/16/2020   VD25OH 56.62 06/16/2014    No results found for: "PREALBUMIN"     Latest Ref Rng & Units 11/21/2021    8:28 AM 10/16/2020    2:30 PM 11/18/2019    8:43 AM  CBC EXTENDED  WBC 4.0 - 10.5 K/uL 11.0  10.5  5.5   RBC 3.87 - 5.11 Mil/uL 4.10  4.36  4.40   Hemoglobin 12.0 - 15.0 g/dL 12.5  13.1  13.5   HCT 36.0 - 46.0 % 35.9  37.5  38.4   Platelets 150.0 - 400.0 K/uL 172.0  247.0  181.0   NEUT# 1.4 - 7.7 K/uL 7.5  8.3  2.9   Lymph# 0.7 - 4.0 K/uL 2.7  1.5  2.0      There is no height or weight on file to calculate BMI.  Orders:  No orders of the defined types were placed in this encounter.  No orders of the defined types were placed in this encounter.    Procedures: No procedures performed  Clinical Data: No additional findings.  ROS:  All other systems negative, except as noted in the HPI. Review of Systems  Objective: Vital Signs: There were no vitals taken for this visit.  Specialty Comments:  No specialty comments available.  PMFS History: Patient Active Problem List   Diagnosis Date Noted   Diastolic dysfunction, grade 2  09/16/2021   Mild aortic regurgitation 09/16/2021  Carotid artery stenosis 08/15/2021   Branch retinal artery occlusion, left eye 07/27/2021   Hyponatremia 11/20/2020   Cataract 11/17/2018   Asymmetrical hearing loss of left ear 04/21/2018   Tinnitus of left ear 04/21/2018   Allergic rhinitis 03/14/2016   Pes cavus 04/11/2014   Vitamin D deficiency 08/04/2013   Osteopenia 04/17/2013   Rhinitis, non-allergic 05/26/2011   SYNCOPE 06/28/2008   Palpitations 06/28/2008   Hyperlipidemia 04/20/2008   History of colonic polyps 04/20/2008   Diabetes type 2, controlled ([place]) 05/28/2007   Past Medical History:  Diagnosis Date   Allergy    Arthritis    bil fingers   Cataract    bil cataracts   Diabetes mellitus without complication (HCC)    diet controlled   Environmental allergies    Ganglion cyst of left foot 06/22/2013   Hyperlipidemia    low HDL   Osteopenia     Family History  Problem Relation Age  of Onset   Allergies Mother    Diabetes Mother    Heart attack Father        > 32   Diabetes Sister    Alzheimer's disease Brother    Colon cancer Maternal Grandfather    Colitis Maternal Grandfather        died at 89 years old   Stroke Neg Hx    Cancer Neg Hx    Esophageal cancer Neg Hx    Rectal cancer Neg Hx    Stomach cancer Neg Hx     Past Surgical History:  Procedure Laterality Date   COLONOSCOPY  2007   Adenomatous polyp   COLONOSCOPY  2014   neg   GANGLION CYST EXCISION Left    left foot   POLYPECTOMY     TONSILLECTOMY AND ADENOIDECTOMY  age 58   TRIGGER FINGER RELEASE Left 08/15/2016   Social History   Occupational History   Not on file  Tobacco Use   Smoking status: Never   Smokeless tobacco: Never  Vaping Use   Vaping Use: Never used  Substance and Sexual Activity   Alcohol use: No   Drug use: No   Sexual activity: Not on file

## 2022-03-11 ENCOUNTER — Ambulatory Visit: Payer: PPO | Admitting: Orthopedic Surgery

## 2022-03-11 ENCOUNTER — Encounter: Payer: Self-pay | Admitting: Orthopedic Surgery

## 2022-03-11 DIAGNOSIS — L97921 Non-pressure chronic ulcer of unspecified part of left lower leg limited to breakdown of skin: Secondary | ICD-10-CM

## 2022-03-11 NOTE — Progress Notes (Signed)
Office Visit Note   Patient: Lisa Mcgee           Date of Birth: Dec 11, 1945           MRN: [id number] Visit Date: 03/11/2022              Requested by: Binnie Rail, MD [place],  [place] [address] PCP: Binnie Rail, MD  Chief Complaint  Patient presents with   Left Leg - Follow-up      HPI: Patient is a 76 year old woman who presents in follow-up for traumatic venous ulcer left leg.  Patient has been wearing the knee-high compression stocking.  Assessment & Plan: Visit Diagnoses:  1. Skin ulcer of left lower leg, limited to breakdown of skin ([place])     Plan: The ulcer has completely healed.  Recommended using Shea butter and continuing with the compression stocking.  Discussed that the skin color changes will resolve as the wound continues to heal.  Follow-Up Instructions: Return if symptoms worsen or fail to improve.   Ortho Exam  Patient is alert, oriented, no adenopathy, well-dressed, normal affect, normal respiratory effort. Examination patient's wound has completely healed there is no redness no cellulitis no signs of infection.  Imaging: No results found.   Labs: Lab Results  Component Value Date   HGBA1C 6.0 11/21/2021   HGBA1C 6.1 10/16/2020   HGBA1C 6.0 11/18/2019   LABORGA  12/06/2016    Multiple organisms present,each less than 10,000 CFU/mL. These organisms,commonly found on external and internal genitalia,are considered colonizers. No further testing performed.      Lab Results  Component Value Date   ALBUMIN 4.2 11/21/2021   ALBUMIN 4.4 10/16/2020   ALBUMIN 4.5 11/18/2019    No results found for: "MG" Lab Results  Component Value Date   VD25OH 69.54 11/21/2021   VD25OH 54.74 10/16/2020   VD25OH 56.62 06/16/2014    No results found for: "PREALBUMIN"    Latest Ref Rng & Units 11/21/2021    8:28 AM 10/16/2020    2:30 PM 11/18/2019    8:43 AM  CBC EXTENDED  WBC 4.0 - 10.5 K/uL 11.0  10.5  5.5   RBC 3.87 - 5.11  Mil/uL 4.10  4.36  4.40   Hemoglobin 12.0 - 15.0 g/dL 12.5  13.1  13.5   HCT 36.0 - 46.0 % 35.9  37.5  38.4   Platelets 150.0 - 400.0 K/uL 172.0  247.0  181.0   NEUT# 1.4 - 7.7 K/uL 7.5  8.3  2.9   Lymph# 0.7 - 4.0 K/uL 2.7  1.5  2.0      There is no height or weight on file to calculate BMI.  Orders:  No orders of the defined types were placed in this encounter.  No orders of the defined types were placed in this encounter.    Procedures: No procedures performed  Clinical Data: No additional findings.  ROS:  All other systems negative, except as noted in the HPI. Review of Systems  Objective: Vital Signs: There were no vitals taken for this visit.  Specialty Comments:  No specialty comments available.  PMFS History: Patient Active Problem List   Diagnosis Date Noted   Diastolic dysfunction, grade 2  09/16/2021   Mild aortic regurgitation 09/16/2021   Carotid artery stenosis 08/15/2021   Branch retinal artery occlusion, left eye 07/27/2021   Hyponatremia 11/20/2020   Cataract 11/17/2018   Asymmetrical hearing loss of left ear 04/21/2018   Tinnitus  of left ear 04/21/2018   Allergic rhinitis 03/14/2016   Pes cavus 04/11/2014   Vitamin D deficiency 08/04/2013   Osteopenia 04/17/2013   Rhinitis, non-allergic 05/26/2011   SYNCOPE 06/28/2008   Palpitations 06/28/2008   Hyperlipidemia 04/20/2008   History of colonic polyps 04/20/2008   Diabetes type 2, controlled ([place]) 05/28/2007   Past Medical History:  Diagnosis Date   Allergy    Arthritis    bil fingers   Cataract    bil cataracts   Diabetes mellitus without complication (HCC)    diet controlled   Environmental allergies    Ganglion cyst of left foot 06/22/2013   Hyperlipidemia    low HDL   Osteopenia     Family History  Problem Relation Age of Onset   Allergies Mother    Diabetes Mother    Heart attack Father        > 20   Diabetes Sister    Alzheimer's disease Brother    Colon cancer  Maternal Grandfather    Colitis Maternal Grandfather        died at 44 years old   Stroke Neg Hx    Cancer Neg Hx    Esophageal cancer Neg Hx    Rectal cancer Neg Hx    Stomach cancer Neg Hx     Past Surgical History:  Procedure Laterality Date   COLONOSCOPY  2007   Adenomatous polyp   COLONOSCOPY  2014   neg   GANGLION CYST EXCISION Left    left foot   POLYPECTOMY     TONSILLECTOMY AND ADENOIDECTOMY  age 28   TRIGGER FINGER RELEASE Left 08/15/2016   Social History   Occupational History   Not on file  Tobacco Use   Smoking status: Never   Smokeless tobacco: Never  Vaping Use   Vaping Use: Never used  Substance and Sexual Activity   Alcohol use: No   Drug use: No   Sexual activity: Not on file

## 2022-06-11 DIAGNOSIS — H35363 Drusen (degenerative) of macula, bilateral: Secondary | ICD-10-CM | POA: Diagnosis not present

## 2022-06-11 DIAGNOSIS — H348322 Tributary (branch) retinal vein occlusion, left eye, stable: Secondary | ICD-10-CM | POA: Diagnosis not present

## 2022-06-11 DIAGNOSIS — H2513 Age-related nuclear cataract, bilateral: Secondary | ICD-10-CM | POA: Diagnosis not present

## 2022-06-11 DIAGNOSIS — H3562 Retinal hemorrhage, left eye: Secondary | ICD-10-CM | POA: Diagnosis not present

## 2022-06-11 DIAGNOSIS — H43813 Vitreous degeneration, bilateral: Secondary | ICD-10-CM | POA: Diagnosis not present

## 2022-07-17 DIAGNOSIS — E119 Type 2 diabetes mellitus without complications: Secondary | ICD-10-CM | POA: Diagnosis not present

## 2022-07-17 LAB — HM DIABETES EYE EXAM

## 2022-09-24 ENCOUNTER — Telehealth: Payer: Self-pay

## 2022-09-24 NOTE — Telephone Encounter (Signed)
Called patient to schedule Medicare Annual Wellness Visit (AWV). Left message for patient to call back and schedule Medicare Annual Wellness Visit (AWV).  Last date of AWV: 09/28/21  Please schedule an appointment at any time with [facility] .    Agnes Lawrence, CMA (AAMA)  [facility]- AWV Program

## 2022-10-14 ENCOUNTER — Ambulatory Visit (INDEPENDENT_AMBULATORY_CARE_PROVIDER_SITE_OTHER): Payer: PPO | Admitting: Family Medicine

## 2022-10-14 ENCOUNTER — Encounter: Payer: Self-pay | Admitting: Family Medicine

## 2022-10-14 VITALS — BP 132/68 | HR 74 | Temp 97.8°F | Resp 20 | Ht 66.0 in | Wt 146.0 lb

## 2022-10-14 DIAGNOSIS — J0140 Acute pansinusitis, unspecified: Secondary | ICD-10-CM | POA: Diagnosis not present

## 2022-10-14 MED ORDER — AMOXICILLIN-POT CLAVULANATE 875-125 MG PO TABS: 1.0000 | ORAL_TABLET | Freq: Two times a day (BID) | ORAL | 0 refills | Status: AC

## 2022-10-14 NOTE — Patient Instructions (Signed)
Throat lozenges, chloraseptic spray, warm salt water gargles, hot tea/honey, cough syrup (Delsym), Tylenol/Ibuprofen, Vicks, and a humidifier at night.  

## 2022-10-14 NOTE — Progress Notes (Signed)
Assessment & Plan:  1. Acute non-recurrent pansinusitis Education provided on sinus infections.  Discussed symptom management including throat lozenges, chloraseptic spray, warm salt water gargles, hot tea/honey, cough syrup (Delsym), Tylenol/Ibuprofen, Vicks, and a humidifier at night.  - amoxicillin-clavulanate (AUGMENTIN) 875-125 MG tablet; Take 1 tablet by mouth 2 (two) times daily for 7 days.  Dispense: 14 tablet; Refill: 0   Follow up plan: Return if symptoms worsen or fail to improve.  Lisa Boston, MSN, APRN, FNP-C  Subjective:  HPI: Lisa Mcgee is a 77 y.o. female presenting on 10/14/2022 for Sinusitis (Pressure in teeth and face, low grade fever, nasal congestion/Started Saturday )  Patient complains of head congestion, facial pain/pressure, and fever. She has not taken her temperature with a thermometer. She denies shortness of breath and wheezing. Onset of symptoms was 2 days ago, gradually improving since that time. She is drinking plenty of fluids. Evaluation to date: none. Treatment to date:  herbs and Ibuprofen . She does not smoke.     ROS: Negative unless specifically indicated above in HPI.   Relevant past medical history reviewed and updated as indicated.   Allergies and medications reviewed and updated.   Current Outpatient Medications:    Calcium Carbonate-Vitamin D (CALCIUM 600 + D PO), Take 1 tablet by mouth 2 (two) times daily., Disp: , Rfl:    Cholecalciferol (VITAMIN D3) 2000 UNITS TABS, Take 1 tablet by mouth 2 (two) times daily., Disp: , Rfl:    clobetasol cream (TEMOVATE) 0.05 %, Apply 1 application topically as needed., Disp: , Rfl:    COD LIVER OIL PO, Take by mouth., Disp: , Rfl:    Coenzyme Q10 (COQ10 PO), Take 300 mg by mouth daily., Disp: , Rfl:    fish oil-omega-3 fatty acids 1000 MG capsule, Take 2 g by mouth daily., Disp: , Rfl:    Flaxseed, Linseed, (FLAX SEED OIL) 1000 MG CAPS, Take 1 capsule by mouth 2 (two) times daily., Disp: , Rfl:     Garlic 100 MG TABS, Take 1,000 mg by mouth 2 (two) times daily., Disp: , Rfl:    ibuprofen (ADVIL,MOTRIN) 200 MG tablet, Take 200 mg by mouth. Take 3 pills prn, Disp: , Rfl:    ketoconazole (NIZORAL) 2 % cream, Apply 1 application topically 2 (two) times daily as needed for irritation., Disp: 15 g, Rfl: 0   ketoconazole (NIZORAL) 2 % shampoo, Apply topically., Disp: , Rfl:    Multiple Vitamins-Minerals (CENTRUM SILVER 50+WOMEN PO), Take by mouth., Disp: , Rfl:    mupirocin ointment (BACTROBAN) 2 %, Apply 1 application topically as needed., Disp: , Rfl:    pyridOXINE (VITAMIN B-6) 100 MG tablet, Take 100 mg by mouth 2 (two) times daily., Disp: , Rfl:    triamcinolone (KENALOG) 0.025 % cream, Apply 1 application topically 2 (two) times daily., Disp: , Rfl:    triamcinolone cream (KENALOG) 0.1 %, Apply 1 application topically as needed., Disp: , Rfl:    vitamin B-12 (CYANOCOBALAMIN) 1000 MCG tablet, Take 1,000 mcg by mouth daily., Disp: , Rfl:    zinc gluconate 50 MG tablet, Take 50 mg by mouth 2 (two) times daily., Disp: , Rfl:   Allergies  Allergen Reactions   Atropine     Given when pt was born.  MD said never to take ATROPINE.   Belladonna Alkaloids     Given when pt was born.  MD said never to take BELLADONNA.   Doxycycline Nausea Only    Confusion  Prevnar [Pneumococcal 13-Val Conj Vacc] Other (See Comments)    Itching, redness, and rash   Egg-Derived Products     Per pt makes her head "feel funny"   Lorazepam Other (See Comments)    Excessive drowsiness - use with caution   Other Other (See Comments)    Per pt makes her head "feel funny"    Objective:   BP 132/68   Pulse 74   Temp 97.8 F (36.6 C)   Resp 20   Ht 5\' 6"  (1.676 m)   Wt 146 lb (66.2 kg)   BMI 23.57 kg/m    Physical Exam Vitals reviewed.  Constitutional:      General: She is not in acute distress.    Appearance: Normal appearance. She is not ill-appearing, toxic-appearing or diaphoretic.  HENT:      Head: Normocephalic and atraumatic.     Right Ear: Tympanic membrane, ear canal and external ear normal. There is no impacted cerumen.     Left Ear: Tympanic membrane, ear canal and external ear normal. There is no impacted cerumen.     Nose: Congestion present. No rhinorrhea.     Right Sinus: Maxillary sinus tenderness and frontal sinus tenderness present.     Left Sinus: Maxillary sinus tenderness and frontal sinus tenderness present.     Mouth/Throat:     Mouth: Mucous membranes are moist.     Pharynx: Oropharynx is clear. Posterior oropharyngeal erythema present. No oropharyngeal exudate.  Eyes:     General: No scleral icterus.       Right eye: No discharge.        Left eye: No discharge.     Conjunctiva/sclera: Conjunctivae normal.  Cardiovascular:     Rate and Rhythm: Normal rate and regular rhythm.     Heart sounds: Normal heart sounds. No murmur heard.    No friction rub. No gallop.  Pulmonary:     Effort: Pulmonary effort is normal. No respiratory distress.     Breath sounds: Normal breath sounds. No stridor. No wheezing, rhonchi or rales.  Musculoskeletal:        General: Normal range of motion.     Cervical back: Normal range of motion.  Lymphadenopathy:     Cervical: No cervical adenopathy.  Skin:    General: Skin is warm and dry.     Capillary Refill: Capillary refill takes less than 2 seconds.  Neurological:     General: No focal deficit present.     Mental Status: She is alert and oriented to person, place, and time. Mental status is at baseline.  Psychiatric:        Mood and Affect: Mood normal.        Behavior: Behavior normal.        Thought Content: Thought content normal.        Judgment: Judgment normal.

## 2022-11-25 ENCOUNTER — Encounter: Payer: Self-pay | Admitting: Internal Medicine

## 2022-11-25 NOTE — Patient Instructions (Addendum)

## 2022-11-25 NOTE — Progress Notes (Unsigned)
Subjective:    Patient ID: Lisa Mcgee, female    DOB: 1946-04-20, 77 y.o.   MRN: [id number]      HPI Lisa Mcgee is here for a Physical exam and her chronic medical problems.   Doing well.  She is a foster parent.   Medications and allergies reviewed with patient and updated if appropriate.  Current Outpatient Medications on File Prior to Visit  Medication Sig Dispense Refill   Calcium Carbonate-Vitamin D (CALCIUM 600 + D PO) Take 1 tablet by mouth 2 (two) times daily.     cetirizine (ZYRTEC) 10 MG tablet Take 10 mg by mouth daily.     Cholecalciferol (VITAMIN D3) 2000 UNITS TABS Take 1 tablet by mouth 2 (two) times daily.     COD LIVER OIL PO Take by mouth.     Coenzyme Q10 (COQ10 PO) Take 300 mg by mouth daily.     fish oil-omega-3 fatty acids 1000 MG capsule Take 2 g by mouth daily.     Flaxseed, Linseed, (FLAX SEED OIL) 1000 MG CAPS Take 1 capsule by mouth 2 (two) times daily.     Garlic 100 MG TABS Take 1,000 mg by mouth 2 (two) times daily.     ibuprofen (ADVIL,MOTRIN) 200 MG tablet Take 200 mg by mouth. Take 3 pills prn     ketoconazole (NIZORAL) 2 % shampoo Apply topically.     Multiple Vitamins-Minerals (CENTRUM SILVER 50+WOMEN PO) Take by mouth.     pyridOXINE (VITAMIN B-6) 100 MG tablet Take 100 mg by mouth 2 (two) times daily.     triamcinolone cream (KENALOG) 0.1 % Apply 1 application topically as needed.     vitamin B-12 (CYANOCOBALAMIN) 1000 MCG tablet Take 1,000 mcg by mouth daily.     zinc gluconate 50 MG tablet Take 50 mg by mouth 2 (two) times daily.     No current facility-administered medications on file prior to visit.    Review of Systems  Constitutional:  Negative for fever.  Eyes:  Negative for visual disturbance.  Respiratory:  Negative for cough, shortness of breath and wheezing.   Cardiovascular:  Negative for chest pain, palpitations and leg swelling.  Gastrointestinal:  Negative for abdominal pain, blood in stool, constipation and diarrhea.        No gerd  Genitourinary:  Negative for dysuria.  Musculoskeletal:  Negative for arthralgias and back pain.  Skin:  Positive for rash (moisquitoes and reaction to fabric sheets).  Neurological:  Negative for light-headedness and headaches.  Psychiatric/Behavioral:  Negative for dysphoric mood. The patient is not nervous/anxious.        Objective:   Vitals:   11/26/22 0757  BP: 124/62  Pulse: 74  Temp: 98.5 F (36.9 C)  SpO2: 98%   Filed Weights   11/26/22 0757  Weight: 143 lb 12.8 oz (65.2 kg)   Body mass index is 23.21 kg/m.  BP Readings from Last 3 Encounters:  11/26/22 124/62  10/14/22 132/68  01/16/22 128/74    Wt Readings from Last 3 Encounters:  11/26/22 143 lb 12.8 oz (65.2 kg)  10/14/22 146 lb (66.2 kg)  11/21/21 140 lb 12.8 oz (63.9 kg)       Physical Exam Constitutional: She appears well-developed and well-nourished. No distress.  HENT:  Head: Normocephalic and atraumatic.  Right Ear: External ear normal. Normal ear canal and TM Left Ear: External ear normal.  Normal ear canal and TM Mouth/Throat: Oropharynx is clear and moist.  Eyes: Conjunctivae normal.  Neck: Neck supple. No tracheal deviation present. No thyromegaly present.  No carotid bruit  Cardiovascular: Normal rate, regular rhythm and normal heart sounds.   No murmur heard.  No edema. Pulmonary/Chest: Effort normal and breath sounds normal. No respiratory distress. She has no wheezes. She has no rales.  Breast: deferred   Abdominal: Soft. She exhibits no distension. There is no tenderness.  Lymphadenopathy: She has no cervical adenopathy.  Skin: Skin is warm and dry. She is not diaphoretic.  Psychiatric: She has a normal mood and affect. Her behavior is normal.     Lab Results  Component Value Date   WBC 11.0 (H) 11/21/2021   HGB 12.5 11/21/2021   HCT 35.9 (L) 11/21/2021   PLT 172.0 11/21/2021   GLUCOSE 117 (H) 11/21/2021   CHOL 126 11/21/2021   TRIG 52.0 11/21/2021   HDL  65.60 11/21/2021   LDLCALC 50 11/21/2021   ALT 16 11/21/2021   AST 25 11/21/2021   NA 130 (L) 11/21/2021   K 3.8 11/21/2021   CL 97 11/21/2021   CREATININE 0.81 11/21/2021   BUN 16 11/21/2021   CO2 26 11/21/2021   TSH 0.39 10/16/2020   HGBA1C 6.0 11/21/2021   MICROALBUR <0.7 11/21/2021    The ASCVD Risk score (Arnett DK, et al., 2019) failed to calculate for the following reasons:   The valid total cholesterol range is 130 to 320 mg/dL      Assessment & Plan:   Physical exam: Screening blood work  ordered Exercise  housework, yard work - needs to get back to the gym Weight normal Substance abuse  none   Reviewed recommended immunizations.   Health Maintenance  Topic Date Due   HEMOGLOBIN A1C  05/23/2022   Medicare Annual Wellness (AWV)  09/29/2022   Diabetic kidney evaluation - eGFR measurement  11/22/2022   Diabetic kidney evaluation - Urine ACR  11/22/2022   FOOT EXAM  11/22/2022   Colonoscopy  02/26/2023   COVID-19 Vaccine (1) 12/11/2022 (Originally 09/15/1946)   INFLUENZA VACCINE  01/09/2023   OPHTHALMOLOGY EXAM  07/18/2023   MAMMOGRAM  10/02/2023   DEXA SCAN  10/01/2024   DTaP/Tdap/Td (3 - Td or Tdap) 12/09/2028   Pneumonia Vaccine 18+ Years old  Completed   Hepatitis C Screening  Completed   Zoster Vaccines- Shingrix  Completed   HPV VACCINES  Aged Out          See Problem List for Assessment and Plan of chronic medical problems.

## 2022-11-26 ENCOUNTER — Ambulatory Visit (INDEPENDENT_AMBULATORY_CARE_PROVIDER_SITE_OTHER): Payer: PPO | Admitting: Internal Medicine

## 2022-11-26 VITALS — BP 124/62 | HR 74 | Temp 98.5°F | Ht 66.0 in | Wt 143.8 lb

## 2022-11-26 DIAGNOSIS — E871 Hypo-osmolality and hyponatremia: Secondary | ICD-10-CM | POA: Diagnosis not present

## 2022-11-26 DIAGNOSIS — Z0000 Encounter for general adult medical examination without abnormal findings: Secondary | ICD-10-CM | POA: Diagnosis not present

## 2022-11-26 DIAGNOSIS — I6523 Occlusion and stenosis of bilateral carotid arteries: Secondary | ICD-10-CM

## 2022-11-26 DIAGNOSIS — R21 Rash and other nonspecific skin eruption: Secondary | ICD-10-CM | POA: Diagnosis not present

## 2022-11-26 DIAGNOSIS — M8589 Other specified disorders of bone density and structure, multiple sites: Secondary | ICD-10-CM

## 2022-11-26 DIAGNOSIS — I5189 Other ill-defined heart diseases: Secondary | ICD-10-CM

## 2022-11-26 DIAGNOSIS — E119 Type 2 diabetes mellitus without complications: Secondary | ICD-10-CM | POA: Diagnosis not present

## 2022-11-26 DIAGNOSIS — E559 Vitamin D deficiency, unspecified: Secondary | ICD-10-CM | POA: Diagnosis not present

## 2022-11-26 LAB — CBC WITH DIFFERENTIAL/PLATELET
Basophils Absolute: 0 10*3/uL (ref 0.0–0.1)
Basophils Relative: 0.6 % (ref 0.0–3.0)
Eosinophils Absolute: 0.3 10*3/uL (ref 0.0–0.7)
Eosinophils Relative: 4.2 % (ref 0.0–5.0)
HCT: 41.4 % (ref 36.0–46.0)
Hemoglobin: 13.8 g/dL (ref 12.0–15.0)
Lymphocytes Relative: 29.7 % (ref 12.0–46.0)
Lymphs Abs: 2 10*3/uL (ref 0.7–4.0)
MCHC: 33.4 g/dL (ref 30.0–36.0)
MCV: 87.7 fl (ref 78.0–100.0)
Monocytes Absolute: 0.5 10*3/uL (ref 0.1–1.0)
Monocytes Relative: 7.7 % (ref 3.0–12.0)
Neutro Abs: 4 10*3/uL (ref 1.4–7.7)
Neutrophils Relative %: 57.8 % (ref 43.0–77.0)
Platelets: 218 10*3/uL (ref 150.0–400.0)
RBC: 4.72 Mil/uL (ref 3.87–5.11)
RDW: 13.3 % (ref 11.5–15.5)
WBC: 6.9 10*3/uL (ref 4.0–10.5)

## 2022-11-26 LAB — COMPREHENSIVE METABOLIC PANEL
ALT: 14 U/L (ref 0–35)
AST: 23 U/L (ref 0–37)
Albumin: 4.4 g/dL (ref 3.5–5.2)
Alkaline Phosphatase: 60 U/L (ref 39–117)
BUN: 12 mg/dL (ref 6–23)
CO2: 26 mEq/L (ref 19–32)
Calcium: 9.4 mg/dL (ref 8.4–10.5)
Chloride: 100 mEq/L (ref 96–112)
Creatinine, Ser: 0.89 mg/dL (ref 0.40–1.20)
GFR: 63.74 mL/min (ref >60.00)
Glucose, Bld: 130 mg/dL — ABNORMAL HIGH (ref 70–99)
Potassium: 4.2 mEq/L (ref 3.5–5.1)
Sodium: 134 mEq/L — ABNORMAL LOW (ref 135–145)
Total Bilirubin: 0.6 mg/dL (ref 0.2–1.2)
Total Protein: 7.6 g/dL (ref 6.0–8.3)

## 2022-11-26 LAB — LIPID PANEL
Cholesterol: 141 mg/dL (ref 0–200)
HDL: 57.1 mg/dL (ref >39.00)
LDL Cholesterol: 69 mg/dL (ref 0–99)
NonHDL: 83.92
Total CHOL/HDL Ratio: 2
Triglycerides: 75 mg/dL (ref 0.0–149.0)
VLDL: 15 mg/dL (ref 0.0–40.0)

## 2022-11-26 LAB — VITAMIN D 25 HYDROXY (VIT D DEFICIENCY, FRACTURES): VITD: 60.15 ng/mL (ref 30.00–100.00)

## 2022-11-26 LAB — MICROALBUMIN / CREATININE URINE RATIO
Creatinine,U: 46.2 mg/dL
Microalb Creat Ratio: 1.5 mg/g (ref 0.0–30.0)
Microalb, Ur: <0.7 mg/dL (ref 0.0–1.9)

## 2022-11-26 LAB — HEMOGLOBIN A1C: Hgb A1c MFr Bld: 6.2 % (ref 4.6–6.5)

## 2022-11-26 MED ORDER — MUPIROCIN 2 % EX OINT: 1.0000 | TOPICAL_OINTMENT | CUTANEOUS | 0 refills | Status: DC | PRN

## 2022-11-26 NOTE — Assessment & Plan Note (Addendum)
Chronic Mild in left ICA Discussed starting statin - deferred which is reasonable given LDL is at goal with lifestyle Check lipids Healthy diet, regular exercise

## 2022-11-26 NOTE — Assessment & Plan Note (Signed)
History of hyponatremia CMP

## 2022-11-26 NOTE — Assessment & Plan Note (Signed)
Chronic Taking vitamin D daily Check vitamin D level  

## 2022-11-26 NOTE — Assessment & Plan Note (Signed)
Chronic dexa up to date Exercising regularly Continue calcium and vitamin d daily Check vitamin D level

## 2022-11-26 NOTE — Assessment & Plan Note (Signed)
Chronic Diet controlled Check A1c, urine microalbumin Sugars have been very well-controlled Continue diabetic diet, regular exercise

## 2022-11-26 NOTE — Assessment & Plan Note (Signed)
Chronic No evidence of heart failure

## 2022-11-26 NOTE — Assessment & Plan Note (Signed)
Acute B/l legs Related to insect bites and possible contact dermatitis Triamcinolone cream 0.1% bid - has this at home

## 2022-11-28 ENCOUNTER — Encounter: Payer: Self-pay | Admitting: Internal Medicine

## 2023-03-24 NOTE — Progress Notes (Signed)
This encounter was created in error - please disregard.  I called [patient with no answer.  I waited for about 5 mins and called back, still no answer.  I left a detailed message for patient to call office back to reschedule.

## 2023-05-06 DIAGNOSIS — L408 Other psoriasis: Secondary | ICD-10-CM | POA: Diagnosis not present

## 2023-05-19 ENCOUNTER — Ambulatory Visit (INDEPENDENT_AMBULATORY_CARE_PROVIDER_SITE_OTHER): Payer: PPO

## 2023-05-19 VITALS — Ht 66.0 in | Wt 135.0 lb

## 2023-05-19 DIAGNOSIS — Z0000 Encounter for general adult medical examination without abnormal findings: Secondary | ICD-10-CM

## 2023-05-19 NOTE — Progress Notes (Unsigned)
Subjective:   Lisa Mcgee is a 77 y.o. female who presents for Medicare Annual (Subsequent) preventive examination.  Visit Complete: Virtual I connected with  Syrianna G Freehling on 05/19/23 by a audio enabled telemedicine application and verified that I am speaking with the correct person using two identifiers.  Patient Location: Home  Provider Location: Home Office  I discussed the limitations of evaluation and management by telemedicine. The patient expressed understanding and agreed to proceed.  Vital Signs: Because this visit was a virtual/telehealth visit, some criteria may be missing or patient reported. Any vitals not documented were not able to be obtained and vitals that have been documented are patient reported.   Cardiac Risk Factors include: diabetes mellitus;advanced age (>90men, >58 women)     Objective:    Today's Vitals   05/19/23 1530  Weight: 135 lb (61.2 kg)  Height: 5\' 6"  (1.676 m)   Body mass index is 21.79 kg/m.     05/19/2023    3:38 PM 09/28/2021   11:27 AM  Advanced Directives  Does Patient Have a Medical Advance Directive? No No  Would patient like information on creating a medical advance directive?  No - Patient declined    Current Medications (verified) Outpatient Encounter Medications as of 05/19/2023  Medication Sig   Calcium Carbonate-Vitamin D (CALCIUM 600 + D PO) Take 1 tablet by mouth 2 (two) times daily.   cetirizine (ZYRTEC) 10 MG tablet Take 10 mg by mouth daily.   Cholecalciferol (VITAMIN D3) 2000 UNITS TABS Take 1 tablet by mouth 2 (two) times daily.   COD LIVER OIL PO Take by mouth.   Coenzyme Q10 (COQ10 PO) Take 300 mg by mouth daily.   fish oil-omega-3 fatty acids 1000 MG capsule Take 2 g by mouth daily.   Flaxseed, Linseed, (FLAX SEED OIL) 1000 MG CAPS Take 1 capsule by mouth 2 (two) times daily.   Garlic 100 MG TABS Take 1,000 mg by mouth 2 (two) times daily.   ibuprofen (ADVIL,MOTRIN) 200 MG tablet Take 200 mg by mouth. Take  3 pills prn   ketoconazole (NIZORAL) 2 % shampoo Apply topically.   Multiple Vitamins-Minerals (CENTRUM SILVER 50+WOMEN PO) Take by mouth.   mupirocin ointment (BACTROBAN) 2 % Apply 1 Application topically as needed.   pyridOXINE (VITAMIN B-6) 100 MG tablet Take 100 mg by mouth 2 (two) times daily.   triamcinolone cream (KENALOG) 0.1 % Apply 1 application topically as needed.   vitamin B-12 (CYANOCOBALAMIN) 1000 MCG tablet Take 1,000 mcg by mouth daily.   zinc gluconate 50 MG tablet Take 50 mg by mouth 2 (two) times daily.   No facility-administered encounter medications on file as of 05/19/2023.    Allergies (verified) Atropine, Belladonna alkaloids, Doxycycline, Prevnar [pneumococcal 13-val conj vacc], Egg-derived products, Lorazepam, and Other   History: Past Medical History:  Diagnosis Date   Allergy    Arthritis    bil fingers   Cataract    bil cataracts   Diabetes mellitus without complication (HCC)    diet controlled   Environmental allergies    Ganglion cyst of left foot 06/22/2013   Hyperlipidemia    low HDL   Osteopenia    Past Surgical History:  Procedure Laterality Date   COLONOSCOPY  2007   Adenomatous polyp   COLONOSCOPY  2014   neg   GANGLION CYST EXCISION Left    left foot   POLYPECTOMY     TONSILLECTOMY AND ADENOIDECTOMY  age 73   TRIGGER  FINGER RELEASE Left 08/15/2016   Family History  Problem Relation Age of Onset   Allergies Mother    Diabetes Mother    Heart attack Father        > 63   Diabetes Sister    Alzheimer's disease Brother    Colon cancer Maternal Grandfather    Colitis Maternal Grandfather        died at 43 years old   Stroke Neg Hx    Cancer Neg Hx    Esophageal cancer Neg Hx    Rectal cancer Neg Hx    Stomach cancer Neg Hx    Social History   Socioeconomic History   Marital status: Widowed    Spouse name: Not on file   Number of children: Not on file   Years of education: Not on file   Highest education level: Not on  file  Occupational History   Occupation: Retired/  Tobacco Use   Smoking status: Never   Smokeless tobacco: Never  Vaping Use   Vaping status: Never Used  Substance and Sexual Activity   Alcohol use: No   Drug use: No   Sexual activity: Not on file  Other Topics Concern   Not on file  Social History Narrative   Exercise:  Water aerobics twice a week   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   Lives alone   Social Determinants of Health   Financial Resource Strain: Low Risk  (05/19/2023)   Overall Financial Resource Strain (CARDIA)    Difficulty of Paying Living Expenses: Not hard at all  Food Insecurity: No Food Insecurity (05/19/2023)   Hunger Vital Sign    Worried About Running Out of Food in the Last Year: Never true    Ran Out of Food in the Last Year: Never true  Transportation Needs: No Transportation Needs (05/19/2023)   PRAPARE - Administrator, Civil Service (Medical): No    Lack of Transportation (Non-Medical): No  Physical Activity: Sufficiently Active (05/19/2023)   Exercise Vital Sign    Days of Exercise per Week: 7 days    Minutes of Exercise per Session: 40 min  Stress: No Stress Concern Present (05/19/2023)   [employer] of Occupational Health - Occupational Stress Questionnaire    Feeling of Stress : Not at all  Social Connections: Moderately Integrated (05/19/2023)   Social Connection and Isolation Panel [NHANES]    Frequency of Communication with Friends and Family: More than three times a week    Frequency of Social Gatherings with Friends and Family: More than three times a week    Attends Religious Services: More than 4 times per year    Active Member of [employer] or Organizations: Yes    Attends Banker Meetings: More than 4 times per year    Marital Status: Widowed    Tobacco Counseling Counseling given: Not Answered   Clinical Intake:  Pre-visit preparation completed: Yes  Pain : No/denies pain     BMI - recorded:  21.79 Nutritional Status: BMI of 19-24  Normal Nutritional Risks: None Diabetes: Yes CBG done?: No Did pt. bring in CBG monitor from home?: No  How often do you need to have someone help you when you read instructions, pamphlets, or other written materials from your doctor or pharmacy?: 1 - Never  Interpreter Needed?: No  Information entered by :: Talan Gildner, RMA   Activities of Daily Living    05/19/2023    3:32 PM  In your  present state of health, do you have any difficulty performing the following activities:  Hearing? 0  Vision? 0  Difficulty concentrating or making decisions? 0  Walking or climbing stairs? 0  Dressing or bathing? 0  Doing errands, shopping? 0  Preparing Food and eating ? N  Using the Toilet? N  In the past six months, have you accidently leaked urine? N  Do you have problems with loss of bowel control? N  Managing your Medications? N  Managing your Finances? N  Housekeeping or managing your Housekeeping? N    Patient Care Team: Pincus Sanes, MD as PCP - General (Internal Medicine) Richarda Overlie, MD as Consulting Physician (Obstetrics and Gynecology) [place], Heather, OD (Optometry)  Indicate any recent Medical Services you may have received from other than [facility] providers in the past year (date may be approximate).     Assessment:   This is a routine wellness examination for Kristyl.  Hearing/Vision screen Hearing Screening - Comments:: Denies hearing difficulties   Vision Screening - Comments:: Wears eyeglasses/cateract   Goals Addressed             This Visit's Progress    Patient Stated       Maintain health      Depression Screen    05/19/2023    3:43 PM 11/26/2022    8:01 AM 10/14/2022    8:46 AM 11/21/2021    7:57 AM 11/21/2021    7:56 AM 09/28/2021   11:28 AM 07/27/2021   10:00 AM  PHQ 2/9 Scores  PHQ - 2 Score 0 0 0 0 0 0 0  PHQ- 9 Score 0   0       Fall Risk    05/19/2023    3:39 PM 11/26/2022    8:01 AM 10/14/2022     8:45 AM 11/21/2021    7:56 AM 09/28/2021   11:28 AM  Fall Risk   Falls in the past year? 0 0 0 0 0  Number falls in past yr: 0 0 0 0 0  Injury with Fall? 0 0 0 0 0  Risk for fall due to : No Fall Risks No Fall Risks No Fall Risks No Fall Risks   Follow up Falls prevention discussed;Falls evaluation completed Falls evaluation completed Falls evaluation completed Falls evaluation completed Falls evaluation completed    MEDICARE RISK AT HOME: Medicare Risk at Home Any stairs in or around the home?: No Home free of loose throw rugs in walkways, pet beds, electrical cords, etc?: Yes Adequate lighting in your home to reduce risk of falls?: Yes Life alert?: No Use of a cane, walker or w/c?: No Grab bars in the bathroom?: No Shower chair or bench in shower?: No Elevated toilet seat or a handicapped toilet?: No  TIMED UP AND GO:  Was the test performed?  No    Cognitive Function:        05/19/2023    3:40 PM  6CIT Screen  What Year? 0 points  What month? 0 points  What time? 0 points  Count back from 20 0 points  Months in reverse 0 points  Repeat phrase 0 points  Total Score 0 points    Immunizations Immunization History  Administered Date(s) Administered   Fluad Quad(high Dose 65+) 03/04/2019, 04/05/2020, 05/28/2021   Influenza, High Dose Seasonal PF 03/16/2013, 03/01/2014, 02/14/2015, 03/01/2016, 03/11/2017   Pneumococcal Conjugate-13 02/28/2015   Pneumococcal Polysaccharide-23 03/26/2013   Td 04/20/2008   Tdap 12/10/2018   Zoster  Recombinant(Shingrix) 12/26/2017, 04/03/2018   Zoster, Live 04/01/2013    TDAP status: Up to date  Flu Vaccine status: Declined, Education has been provided regarding the importance of this vaccine but patient still declined. Advised may receive this vaccine at local pharmacy or Health Dept. Aware to provide a copy of the vaccination record if obtained from local pharmacy or Health Dept. Verbalized acceptance and  understanding.  Pneumococcal vaccine status: Up to date  Covid-19 vaccine status: Declined, Education has been provided regarding the importance of this vaccine but patient still declined. Advised may receive this vaccine at local pharmacy or Health Dept.or vaccine clinic. Aware to provide a copy of the vaccination record if obtained from local pharmacy or Health Dept. Verbalized acceptance and understanding.  Qualifies for Shingles Vaccine? Yes   Zostavax completed Yes   Shingrix Completed?: Yes  Screening Tests Health Maintenance  Topic Date Due   COVID-19 Vaccine (1 - 2023-24 season) 06/04/2023 (Originally 02/09/2023)   INFLUENZA VACCINE  09/08/2023 (Originally 01/09/2023)   FOOT EXAM  11/27/2023 (Originally 11/22/2022)   Colonoscopy  01/08/2024 (Originally 02/26/2023)   HEMOGLOBIN A1C  05/28/2023   OPHTHALMOLOGY EXAM  07/18/2023   Diabetic kidney evaluation - eGFR measurement  11/26/2023   Diabetic kidney evaluation - Urine ACR  11/26/2023   Medicare Annual Wellness (AWV)  05/18/2024   DEXA SCAN  10/01/2024   DTaP/Tdap/Td (3 - Td or Tdap) 12/09/2028   Pneumonia Vaccine 64+ Years old  Completed   Hepatitis C Screening  Completed   Zoster Vaccines- Shingrix  Completed   HPV VACCINES  Aged Out    Health Maintenance  There are no preventive care reminders to display for this patient.   Colorectal cancer screening: Type of screening: Colonoscopy. Completed 02/25/2018. Repeat every 5 years  Mammogram status: No longer required due to age.  Bone Density status: Completed 10/01/2021. Results reflect: Bone density results: OSTEOPENIA. Repeat every 2 years.  Lung Cancer Screening: (Low Dose CT Chest recommended if Age 64-80 years, 20 pack-year currently smoking OR have quit w/in 15years.) does not qualify.   Lung Cancer Screening Referral: N/A  Additional Screening:  Hepatitis C Screening: does qualify; Completed 11/25/2022  Vision Screening: Recommended annual ophthalmology  exams for early detection of glaucoma and other disorders of the eye. Is the patient up to date with their annual eye exam?  Yes  Who is the provider or what is the name of the office in which the patient attends annual eye exams? Dr. Louanna Raw If pt is not established with a provider, would they like to be referred to a provider to establish care? No .   Dental Screening: Recommended annual dental exams for proper oral hygiene  Diabetic Foot Exam: Diabetic Foot Exam: Overdue, Pt has been advised about the importance in completing this exam. Pt is scheduled for diabetic foot exam on 11/27/2022.  Community Resource Referral / Chronic Care Management: CRR required this visit?  No   CCM required this visit?  No     Plan:     I have personally reviewed and noted the following in the patient's chart:   Medical and social history Use of alcohol, tobacco or illicit drugs  Current medications and supplements including opioid prescriptions. Patient is not currently taking opioid prescriptions. Functional ability and status Nutritional status Physical activity Advanced directives List of other physicians Hospitalizations, surgeries, and ER visits in previous 12 months Vitals Screenings to include cognitive, depression, and falls Referrals and appointments  In addition, I have reviewed  and discussed with patient certain preventive protocols, quality metrics, and best practice recommendations. A written personalized care plan for preventive services as well as general preventive health recommendations were provided to patient.     Aloria Looper L [place] Severs, CMA   05/19/2023   After Visit Summary: (MyChart) Due to this being a telephonic visit, the after visit summary with patients personalized plan was offered to patient via MyChart   Nurse Notes: Patient is due for a colonoscopy, which she has not made her appointment for as of yet.  She declines vaccines that she is due for at this time.  Patient had  no other concerns to address today.

## 2023-05-19 NOTE — Patient Instructions (Signed)
Ms. Fortuno , Thank you for taking time to come for your Medicare Wellness Visit. I appreciate your ongoing commitment to your health goals. Please review the following plan we discussed and let me know if I can assist you in the future.   Referrals/Orders/Follow-Ups/Clinician Recommendations: Keep up the good work.  Happy Holidays to you and your family.  This is a list of the screening recommended for you and due dates:  Health Maintenance  Topic Date Due   COVID-19 Vaccine (1 - 2023-24 season) 06/04/2023*   Flu Shot  09/08/2023*   Complete foot exam   11/27/2023*   Colon Cancer Screening  01/08/2024*   Hemoglobin A1C  05/28/2023   Eye exam for diabetics  07/18/2023   Yearly kidney function blood test for diabetes  11/26/2023   Yearly kidney health urinalysis for diabetes  11/26/2023   Medicare Annual Wellness Visit  05/18/2024   DEXA scan (bone density measurement)  10/01/2024   DTaP/Tdap/Td vaccine (3 - Td or Tdap) 12/09/2028   Pneumonia Vaccine  Completed   Hepatitis C Screening  Completed   Zoster (Shingles) Vaccine  Completed   HPV Vaccine  Aged Out  *Topic was postponed. The date shown is not the original due date.    Advanced directives: (Declined) Advance directive discussed with you today. Even though you declined this today, please call our office should you change your mind, and we can give you the proper paperwork for you to fill out.  Next Medicare Annual Wellness Visit scheduled for next year: Yes

## 2023-07-21 DIAGNOSIS — E119 Type 2 diabetes mellitus without complications: Secondary | ICD-10-CM | POA: Diagnosis not present

## 2023-07-21 LAB — HM DIABETES EYE EXAM

## 2023-11-21 ENCOUNTER — Telehealth: Payer: Self-pay

## 2023-11-21 ENCOUNTER — Ambulatory Visit: Payer: Self-pay | Admitting: *Deleted

## 2023-11-21 NOTE — Telephone Encounter (Unsigned)
 Copied from CRM [id number]. Topic: Clinical - Red Word Triage >> Nov 21, 2023  2:15 PM Precious C wrote: Red Word that prompted transfer to Nurse Triage: DISCOLORED OR INCREASED MUCOUS. Pt called in to schedule appt w/ Dr. Donnette Gal, pt was on a trip and is retuning today 11/21/23, says she spoke with Mylinda Asa and Jenette Mitchell in office and advised her to got to urgent care due to not currently having appts available, but pt preferred to see her doctor, Dr. Donnette Gal, as she has never been to urgent care before. Called nurse triage, they stated they were short or staff and to inform pt that she can call  the office first thing Monday morning for same day appt. Advised the pt of the info and she said she would call on Monday.

## 2023-11-21 NOTE — Telephone Encounter (Signed)
 FYI Only or Action Required?: FYI only for provider  Patient was last seen in primary care on 11/26/2022 by Colene Dauphin, MD. Called Nurse Triage reporting Cough. Symptoms began several days ago. Interventions attempted: OTC medications: Mucinex . Symptoms are: gradually worsening.  Triage Disposition: See Physician Within 24 Hours  Patient/caregiver understands and will follow disposition?: Yes- patient is out of state- will be back late afternoon- UC advised    Reason for Disposition  [1] Continuous (nonstop) coughing interferes with work or school AND [2] no improvement using cough treatment per Care Advice  Answer Assessment - Initial Assessment Questions 1. ONSET: When did the cough begin?      1 week- on vacation 2. SEVERITY: How bad is the cough today?      Keeping up at night 3. SPUTUM: Describe the color of your sputum (none, dry cough; clear, white, yellow, green)     green 4. HEMOPTYSIS: Are you coughing up any blood? If so ask: How much? (flecks, streaks, tablespoons, etc.)     no 5. DIFFICULTY BREATHING: Are you having difficulty breathing? If Yes, ask: How bad is it? (e.g., mild, moderate, severe)    - MILD: No SOB at rest, mild SOB with walking, speaks normally in sentences, can lie down, no retractions, pulse < 100.    - MODERATE: SOB at rest, SOB with minimal exertion and prefers to sit, cannot lie down flat, speaks in phrases, mild retractions, audible wheezing, pulse 100-120.    - SEVERE: Very SOB at rest, speaks in single words, struggling to breathe, sitting hunched forward, retractions, pulse > 120      no 6. FEVER: Do you have a fever? If Yes, ask: What is your temperature, how was it measured, and when did it start?     unsure   10. OTHER SYMPTOMS: Do you have any other symptoms? (e.g., runny nose, wheezing, chest pain)       Sore throat, sinus pressure  12. TRAVEL: Have you traveled out of the country in the last month? (e.g., travel  history, exposures)       Onvacation now0 will be back late today  Protocols used: Cough - Acute Productive-A-AH    Copied from CRM [id number]. Topic: Clinical - Red Word Triage >> Nov 21, 2023  8:04 AM Alyse July wrote: Red Word that prompted transfer to Nurse Triage: sinus infection, excessive coughing and discolored mucous   ----------------------------------------------------------------------- From previous Reason for Contact - Scheduling: Patient/patient representative is calling to schedule an appointment. Refer to attachments for appointment information.

## 2023-11-24 ENCOUNTER — Ambulatory Visit (INDEPENDENT_AMBULATORY_CARE_PROVIDER_SITE_OTHER): Admitting: Internal Medicine

## 2023-11-24 ENCOUNTER — Encounter: Payer: Self-pay | Admitting: Internal Medicine

## 2023-11-24 VITALS — BP 124/64 | HR 79 | Temp 98.2°F | Ht 66.0 in | Wt 145.0 lb

## 2023-11-24 DIAGNOSIS — J208 Acute bronchitis due to other specified organisms: Secondary | ICD-10-CM

## 2023-11-24 DIAGNOSIS — J209 Acute bronchitis, unspecified: Secondary | ICD-10-CM | POA: Insufficient documentation

## 2023-11-24 DIAGNOSIS — J0100 Acute maxillary sinusitis, unspecified: Secondary | ICD-10-CM

## 2023-11-24 MED ORDER — AMOXICILLIN-POT CLAVULANATE 875-125 MG PO TABS: 1.0000 | ORAL_TABLET | Freq: Two times a day (BID) | ORAL | 0 refills | Status: AC

## 2023-11-24 NOTE — Progress Notes (Signed)
 Subjective:    Patient ID: Lisa Mcgee, female    DOB: Dec 08, 1945, 78 y.o.   MRN: [id number]      HPI Lisa Mcgee is here for  Chief Complaint  Patient presents with   Cough    Patient thinks she has bronchitis; She has been coughing up chucks of mucus    She is here for an acute visit for cold symptoms.   Her symptoms started one weeks ago - she was away  She is experiencing chills, fevers, fatigue, nasal congestion, ear pain, sinus pain, sore throat, cough that is productive of thick and discolored mucus and headaches.  She denies any shortness of breath or wheezing.  She has tried taking herbs, mucinex, advil, tylenol     Medications and allergies reviewed with patient and updated if appropriate.  Current Outpatient Medications on File Prior to Visit  Medication Sig Dispense Refill   Calcium Carbonate-Vitamin D  (CALCIUM 600 + D PO) Take 1 tablet by mouth 2 (two) times daily.     cetirizine (ZYRTEC) 10 MG tablet Take 10 mg by mouth daily.     Cholecalciferol (VITAMIN D3) 2000 UNITS TABS Take 1 tablet by mouth 2 (two) times daily.     clobetasol cream (TEMOVATE) 0.05 % Apply topically.     COD LIVER OIL PO Take by mouth.     Coenzyme Q10 (COQ10 PO) Take 300 mg by mouth daily.     fish oil-omega-3 fatty acids 1000 MG capsule Take 2 g by mouth daily.     Flaxseed, Linseed, (FLAX SEED OIL) 1000 MG CAPS Take 1 capsule by mouth 2 (two) times daily.     Garlic 100 MG TABS Take 1,000 mg by mouth 2 (two) times daily.     ibuprofen (ADVIL,MOTRIN) 200 MG tablet Take 200 mg by mouth. Take 3 pills prn     ketoconazole  (NIZORAL ) 2 % shampoo Apply topically.     Multiple Vitamins-Minerals (CENTRUM SILVER 50+WOMEN PO) Take by mouth.     mupirocin  ointment (BACTROBAN ) 2 % Apply 1 Application topically as needed. 22 g 0   pyridOXINE (VITAMIN B-6) 100 MG tablet Take 100 mg by mouth 2 (two) times daily.     triamcinolone cream (KENALOG) 0.1 % Apply 1 application topically as needed.      vitamin B-12 (CYANOCOBALAMIN) 1000 MCG tablet Take 1,000 mcg by mouth daily.     zinc gluconate 50 MG tablet Take 50 mg by mouth 2 (two) times daily.     No current facility-administered medications on file prior to visit.    Review of Systems  Constitutional:  Positive for chills, fatigue and fever (subjective).  HENT:  Positive for congestion (mild), ear pain, sinus pressure and sore throat.   Respiratory:  Positive for cough (productive of thick green sputum -> creamy color). Negative for chest tightness, shortness of breath and wheezing.   Gastrointestinal:  Negative for diarrhea and nausea.  Musculoskeletal:  Positive for myalgias.  Neurological:  Positive for headaches. Negative for dizziness and light-headedness.       Objective:   Vitals:   11/24/23 1456  BP: 124/64  Pulse: 79  Temp: 98.2 F (36.8 C)  SpO2: 100%   BP Readings from Last 3 Encounters:  11/24/23 124/64  11/26/22 124/62  10/14/22 132/68   Wt Readings from Last 3 Encounters:  11/24/23 145 lb (65.8 kg)  05/19/23 135 lb (61.2 kg)  11/26/22 143 lb 12.8 oz (65.2 kg)   Body mass index is  23.4 kg/m.    Physical Exam Constitutional:      General: She is not in acute distress.    Appearance: Normal appearance. She is not ill-appearing.  HENT:     Head: Normocephalic and atraumatic.     Right Ear: Tympanic membrane, ear canal and external ear normal.     Left Ear: Tympanic membrane, ear canal and external ear normal.     Mouth/Throat:     Mouth: Mucous membranes are moist.     Pharynx: No oropharyngeal exudate or posterior oropharyngeal erythema.   Eyes:     Conjunctiva/sclera: Conjunctivae normal.    Cardiovascular:     Rate and Rhythm: Normal rate and regular rhythm.  Pulmonary:     Effort: Pulmonary effort is normal. No respiratory distress.     Breath sounds: Normal breath sounds. No wheezing or rales.   Musculoskeletal:     Cervical back: Neck supple. No tenderness.  Lymphadenopathy:      Cervical: No cervical adenopathy.   Skin:    General: Skin is warm and dry.   Neurological:     Mental Status: She is alert.            Assessment & Plan:    See Problem List for Assessment and Plan of chronic medical problems.

## 2023-11-24 NOTE — Assessment & Plan Note (Addendum)
 Acute Likely bacterial  Start Augmentin 875-125 mg BID x 7 day otc cold medications Rest, fluid Call if no improvement

## 2023-11-24 NOTE — Assessment & Plan Note (Signed)
 Acute Likely bacterial  Start Augmentin 875-125 mg BID x 7 day otc cold medications Rest, fluid Call if no improvement

## 2023-11-24 NOTE — Patient Instructions (Addendum)
       Medications changes include :   Augmentin  twice daily for 7 days     Return if symptoms worsen or fail to improve.

## 2023-11-27 ENCOUNTER — Encounter: Payer: PPO | Admitting: Internal Medicine

## 2024-02-11 DIAGNOSIS — H9312 Tinnitus, left ear: Secondary | ICD-10-CM | POA: Diagnosis not present

## 2024-02-11 DIAGNOSIS — H903 Sensorineural hearing loss, bilateral: Secondary | ICD-10-CM | POA: Diagnosis not present

## 2024-02-11 DIAGNOSIS — H90A22 Sensorineural hearing loss, unilateral, left ear, with restricted hearing on the contralateral side: Secondary | ICD-10-CM | POA: Diagnosis not present

## 2024-03-02 ENCOUNTER — Encounter: Payer: Self-pay | Admitting: Internal Medicine

## 2024-03-02 NOTE — Patient Instructions (Addendum)
 Your ears were cleaned out.    Blood work was ordered.       Medications changes include :   None    Return in about 1 year (around 03/03/2025) for Physical Exam.     Health Maintenance, Female Adopting a healthy lifestyle and getting preventive care are important in promoting health and wellness. Ask your health care provider about: The right schedule for you to have regular tests and exams. Things you can do on your own to prevent diseases and keep yourself healthy. What should I know about diet, weight, and exercise? Eat a healthy diet  Eat a diet that includes plenty of vegetables, fruits, low-fat dairy products, and lean protein. Do not eat a lot of foods that are high in solid fats, added sugars, or sodium. Maintain a healthy weight Body mass index (BMI) is used to identify weight problems. It estimates body fat based on height and weight. Your health care provider can help determine your BMI and help you achieve or maintain a healthy weight. Get regular exercise Get regular exercise. This is one of the most important things you can do for your health. Most adults should: Exercise for at least 150 minutes each week. The exercise should increase your heart rate and make you sweat (moderate-intensity exercise). Do strengthening exercises at least twice a week. This is in addition to the moderate-intensity exercise. Spend less time sitting. Even light physical activity can be beneficial. Watch cholesterol and blood lipids Have your blood tested for lipids and cholesterol at 76 years of age, then have this test every 5 years. Have your cholesterol levels checked more often if: Your lipid or cholesterol levels are high. You are older than 76 years of age. You are at high risk for heart disease. What should I know about cancer screening? Depending on your health history and family history, you may need to have cancer screening at various ages. This may include screening  for: Breast cancer. Cervical cancer. Colorectal cancer. Skin cancer. Lung cancer. What should I know about heart disease, diabetes, and high blood pressure? Blood pressure and heart disease High blood pressure causes heart disease and increases the risk of stroke. This is more likely to develop in people who have high blood pressure readings or are overweight. Have your blood pressure checked: Every 3-5 years if you are 50-55 years of age. Every year if you are 46 years old or older. Diabetes Have regular diabetes screenings. This checks your fasting blood sugar level. Have the screening done: Once every three years after age 3 if you are at a normal weight and have a low risk for diabetes. More often and at a younger age if you are overweight or have a high risk for diabetes. What should I know about preventing infection? Hepatitis B If you have a higher risk for hepatitis B, you should be screened for this virus. Talk with your health care provider to find out if you are at risk for hepatitis B infection. Hepatitis C Testing is recommended for: Everyone born from [birth year] through 1965. Anyone with known risk factors for hepatitis C. Sexually transmitted infections (STIs) Get screened for STIs, including gonorrhea and chlamydia, if: You are sexually active and are younger than 76 years of age. You are older than 76 years of age and your health care provider tells you that you are at risk for this type of infection. Your sexual activity has changed since you were last screened, and you are  at increased risk for chlamydia or gonorrhea. Ask your health care provider if you are at risk. Ask your health care provider about whether you are at high risk for HIV. Your health care provider may recommend a prescription medicine to help prevent HIV infection. If you choose to take medicine to prevent HIV, you should first get tested for HIV. You should then be tested every 3 months for as long as you  are taking the medicine. Pregnancy If you are about to stop having your period (premenopausal) and you may become pregnant, seek counseling before you get pregnant. Take 400 to 800 micrograms (mcg) of folic acid  every day if you become pregnant. Ask for birth control (contraception) if you want to prevent pregnancy. Osteoporosis and menopause Osteoporosis is a disease in which the bones lose minerals and strength with aging. This can result in bone fractures. If you are 66 years old or older, or if you are at risk for osteoporosis and fractures, ask your health care provider if you should: Be screened for bone loss. Take a calcium  or vitamin D  supplement to lower your risk of fractures. Be given hormone replacement therapy (HRT) to treat symptoms of menopause. Follow these instructions at home: Alcohol use Do not drink alcohol if: Your health care provider tells you not to drink. You are pregnant, may be pregnant, or are planning to become pregnant. If you drink alcohol: Limit how much you have to: 0-1 drink a day. Know how much alcohol is in your drink. In the [place] one drink equals one 12 oz bottle of beer (355 mL), one 5 oz glass of wine (148 mL), or one 1 oz glass of hard liquor (44 mL). Lifestyle Do not use any products that contain nicotine or tobacco. These products include cigarettes, chewing tobacco, and vaping devices, such as e-cigarettes. If you need help quitting, ask your health care provider. Do not use street drugs. Do not share needles. Ask your health care provider for help if you need support or information about quitting drugs. General instructions Schedule regular health, dental, and eye exams. Stay current with your vaccines. Tell your health care provider if: You often feel depressed. You have ever been abused or do not feel safe at home. Summary Adopting a healthy lifestyle and getting preventive care are important in promoting health and wellness. Follow your  health care provider's instructions about healthy diet, exercising, and getting tested or screened for diseases. Follow your health care provider's instructions on monitoring your cholesterol and blood pressure. This information is not intended to replace advice given to you by your health care provider. Make sure you discuss any questions you have with your health care provider. Document Revised: 10/16/2020 Document Reviewed: 10/16/2020 Elsevier Patient Education  2024 [employer].

## 2024-03-02 NOTE — Progress Notes (Unsigned)
 Subjective:    Patient ID: Lisa Mcgee, female    DOB: 23-Feb-1946, 78 y.o.   MRN: [id number]      HPI Lisa Mcgee is here for a Physical exam and her chronic medical problems.   She feels great.  Has no concerns.    Medications and allergies reviewed with patient and updated if appropriate.  Current Outpatient Medications on File Prior to Visit  Medication Sig Dispense Refill   Calcium Carbonate-Vitamin D  (CALCIUM 600 + D PO) Take 1 tablet by mouth 2 (two) times daily.     cetirizine (ZYRTEC) 10 MG tablet Take 10 mg by mouth daily.     Cholecalciferol (VITAMIN D3) 2000 UNITS TABS Take 1 tablet by mouth 2 (two) times daily.     clobetasol cream (TEMOVATE) 0.05 % Apply topically.     COD LIVER OIL PO Take by mouth.     Coenzyme Q10 (COQ10 PO) Take 300 mg by mouth daily.     fish oil-omega-3 fatty acids 1000 MG capsule Take 2 g by mouth daily.     Flaxseed, Linseed, (FLAX SEED OIL) 1000 MG CAPS Take 1 capsule by mouth 2 (two) times daily.     Garlic 100 MG TABS Take 1,000 mg by mouth 2 (two) times daily.     ibuprofen (ADVIL,MOTRIN) 200 MG tablet Take 200 mg by mouth. Take 3 pills prn     ketoconazole  (NIZORAL ) 2 % shampoo Apply topically.     Multiple Vitamins-Minerals (CENTRUM SILVER 50+WOMEN PO) Take by mouth.     mupirocin  ointment (BACTROBAN ) 2 % Apply 1 Application topically as needed. 22 g 0   pyridOXINE (VITAMIN B-6) 100 MG tablet Take 100 mg by mouth 2 (two) times daily.     triamcinolone cream (KENALOG) 0.1 % Apply 1 application topically as needed.     vitamin B-12 (CYANOCOBALAMIN) 1000 MCG tablet Take 1,000 mcg by mouth daily.     zinc gluconate 50 MG tablet Take 50 mg by mouth 2 (two) times daily.     No current facility-administered medications on file prior to visit.    Review of Systems  Constitutional:  Negative for fever.  Eyes:  Negative for visual disturbance.  Respiratory:  Negative for cough, shortness of breath and wheezing.   Cardiovascular:   Negative for chest pain, palpitations and leg swelling.  Gastrointestinal:  Negative for abdominal pain, blood in stool, constipation and diarrhea.       No gerd  Genitourinary:  Negative for dysuria.  Musculoskeletal:  Positive for arthralgias (mild - hands). Negative for back pain.  Skin:  Negative for rash.  Neurological:  Positive for headaches (rare). Negative for light-headedness.  Psychiatric/Behavioral:  Negative for dysphoric mood and sleep disturbance. The patient is not nervous/anxious.        Objective:   Vitals:   03/03/24 0825  BP: 120/80  Pulse: 77  Temp: 98 F (36.7 C)  SpO2: 98%   Filed Weights   03/03/24 0825  Weight: 137 lb (62.1 kg)   Body mass index is 22.11 kg/m.  BP Readings from Last 3 Encounters:  03/03/24 120/80  11/24/23 124/64  11/26/22 124/62    Wt Readings from Last 3 Encounters:  03/03/24 137 lb (62.1 kg)  11/24/23 145 lb (65.8 kg)  05/19/23 135 lb (61.2 kg)       Physical Exam Constitutional: She appears well-developed and well-nourished. No distress.  HENT:  Head: Normocephalic and atraumatic.  Right Ear: External ear normal. Normal ear  canal and TM Left Ear: External ear normal.  Normal ear canal and TM Mouth/Throat: Oropharynx is clear and moist.  Eyes: Conjunctivae normal.  Neck: Neck supple. No tracheal deviation present. No thyromegaly present.  No carotid bruit  Cardiovascular: Normal rate, regular rhythm and normal heart sounds.   No murmur heard.  No edema. Pulmonary/Chest: Effort normal and breath sounds normal. No respiratory distress. She has no wheezes. She has no rales.  Breast: deferred   Abdominal: Soft. She exhibits no distension. There is no tenderness.  Lymphadenopathy: She has no cervical adenopathy.  Skin: Skin is warm and dry. She is not diaphoretic.  Psychiatric: She has a normal mood and affect. Her behavior is normal.   Diabetic Foot Exam - Simple   Simple Foot Form Diabetic Foot exam was performed  with the following findings: Yes 03/03/2024  8:56 AM  Visual Inspection No deformities, no ulcerations, no other skin breakdown bilaterally: Yes Sensation Testing Intact to touch and monofilament testing bilaterally: Yes Pulse Check Posterior Tibialis and Dorsalis pulse intact bilaterally: Yes Comments      Lab Results  Component Value Date   WBC 6.9 11/26/2022   HGB 13.8 11/26/2022   HCT 41.4 11/26/2022   PLT 218.0 11/26/2022   GLUCOSE 130 (H) 11/26/2022   CHOL 141 11/26/2022   TRIG 75.0 11/26/2022   HDL 57.10 11/26/2022   LDLCALC 69 11/26/2022   ALT 14 11/26/2022   AST 23 11/26/2022   NA 134 (L) 11/26/2022   K 4.2 11/26/2022   CL 100 11/26/2022   CREATININE 0.89 11/26/2022   BUN 12 11/26/2022   CO2 26 11/26/2022   TSH 0.39 10/16/2020   HGBA1C 6.2 11/26/2022   MICROALBUR 0.3 04/20/2008         Assessment & Plan:   Physical exam: Screening blood work  ordered Exercise  very active Weight  normal Substance abuse  none   Reviewed recommended immunizations.   Health Maintenance  Topic Date Due   Diabetic kidney evaluation - Urine ACR  04/20/2009   FOOT EXAM  11/22/2022   Colonoscopy  02/26/2023   HEMOGLOBIN A1C  05/28/2023   Diabetic kidney evaluation - eGFR measurement  11/26/2023   COVID-19 Vaccine (1 - 2024-25 season) Never done   Influenza Vaccine  09/07/2024 (Originally 01/09/2024)   Medicare Annual Wellness (AWV)  05/18/2024   OPHTHALMOLOGY EXAM  07/20/2024   DEXA SCAN  10/01/2024   DTaP/Tdap/Td (3 - Td or Tdap) 12/09/2028   Pneumococcal Vaccine: 50+ Years  Completed   Hepatitis C Screening  Completed   Zoster Vaccines- Shingrix  Completed   HPV VACCINES  Aged Out   Meningococcal B Vaccine  Aged Out   Mammogram  Discontinued          See Problem List for Assessment and Plan of chronic medical problems.

## 2024-03-03 ENCOUNTER — Ambulatory Visit (INDEPENDENT_AMBULATORY_CARE_PROVIDER_SITE_OTHER): Admitting: Internal Medicine

## 2024-03-03 VITALS — BP 120/80 | HR 77 | Temp 98.0°F | Ht 66.0 in | Wt 137.0 lb

## 2024-03-03 DIAGNOSIS — M8589 Other specified disorders of bone density and structure, multiple sites: Secondary | ICD-10-CM | POA: Diagnosis not present

## 2024-03-03 DIAGNOSIS — E119 Type 2 diabetes mellitus without complications: Secondary | ICD-10-CM

## 2024-03-03 DIAGNOSIS — E559 Vitamin D deficiency, unspecified: Secondary | ICD-10-CM

## 2024-03-03 DIAGNOSIS — Z0000 Encounter for general adult medical examination without abnormal findings: Secondary | ICD-10-CM | POA: Diagnosis not present

## 2024-03-03 DIAGNOSIS — I5189 Other ill-defined heart diseases: Secondary | ICD-10-CM | POA: Diagnosis not present

## 2024-03-03 LAB — COMPREHENSIVE METABOLIC PANEL WITH GFR
ALT: 15 U/L (ref 0–35)
AST: 24 U/L (ref 0–37)
Albumin: 4.5 g/dL (ref 3.5–5.2)
Alkaline Phosphatase: 84 U/L (ref 39–117)
BUN: 12 mg/dL (ref 6–23)
CO2: 29 meq/L (ref 19–32)
Calcium: 9.8 mg/dL (ref 8.4–10.5)
Chloride: 98 meq/L (ref 96–112)
Creatinine, Ser: 0.83 mg/dL (ref 0.40–1.20)
GFR: 68.7 mL/min (ref >60.00)
Glucose, Bld: 131 mg/dL — ABNORMAL HIGH (ref 70–99)
Potassium: 4.1 meq/L (ref 3.5–5.1)
Sodium: 134 meq/L — ABNORMAL LOW (ref 135–145)
Total Bilirubin: 0.4 mg/dL (ref 0.2–1.2)
Total Protein: 7.2 g/dL (ref 6.0–8.3)

## 2024-03-03 LAB — TSH: TSH: 1.18 u[IU]/mL (ref 0.35–5.50)

## 2024-03-03 LAB — MICROALBUMIN / CREATININE URINE RATIO
Creatinine,U: 34.7 mg/dL
Microalb Creat Ratio: 30.8 mg/g — ABNORMAL HIGH (ref 0.0–30.0)
Microalb, Ur: 1.1 mg/dL (ref 0.0–1.9)

## 2024-03-03 LAB — CBC
HCT: 39.7 % (ref 36.0–46.0)
Hemoglobin: 13.6 g/dL (ref 12.0–15.0)
MCHC: 34.4 g/dL (ref 30.0–36.0)
MCV: 86.4 fl (ref 78.0–100.0)
Platelets: 173 K/uL (ref 150.0–400.0)
RBC: 4.59 Mil/uL (ref 3.87–5.11)
RDW: 13.9 % (ref 11.5–15.5)
WBC: 7.1 K/uL (ref 4.0–10.5)

## 2024-03-03 LAB — LIPID PANEL
Cholesterol: 140 mg/dL (ref 0–200)
HDL: 65.3 mg/dL (ref >39.00)
LDL Cholesterol: 66 mg/dL (ref 0–99)
NonHDL: 74.69
Total CHOL/HDL Ratio: 2
Triglycerides: 45 mg/dL (ref 0.0–149.0)
VLDL: 9 mg/dL (ref 0.0–40.0)

## 2024-03-03 LAB — HEMOGLOBIN A1C: Hgb A1c MFr Bld: 6.5 % (ref 4.6–6.5)

## 2024-03-03 LAB — VITAMIN D 25 HYDROXY (VIT D DEFICIENCY, FRACTURES): VITD: 54.45 ng/mL (ref 30.00–100.00)

## 2024-03-03 NOTE — Assessment & Plan Note (Addendum)
 Chronic Diet controlled Check A1c, lipid panel, CBC, TSH urine microalbumin Sugars have been very well-controlled Deferred statin Continue diabetic diet, regular exercise

## 2024-03-03 NOTE — Assessment & Plan Note (Signed)
 Chronic dexa up to date Exercising regularly Continue calcium and vitamin d  daily Check vitamin D  level, CBC, CMP, TSH

## 2024-03-03 NOTE — Assessment & Plan Note (Signed)
Chronic No evidence of heart failure

## 2024-03-03 NOTE — Assessment & Plan Note (Signed)
 Chronic Taking vitamin D daily Check vitamin D level

## 2024-03-06 ENCOUNTER — Ambulatory Visit: Payer: Self-pay | Admitting: Internal Medicine

## 2024-03-10 ENCOUNTER — Ambulatory Visit

## 2024-03-10 VITALS — Ht 66.0 in | Wt 137.0 lb

## 2024-03-10 DIAGNOSIS — Z0000 Encounter for general adult medical examination without abnormal findings: Secondary | ICD-10-CM | POA: Diagnosis not present

## 2024-03-10 NOTE — Patient Instructions (Signed)
 [name],  Thank you for taking the time for your Medicare Wellness Visit. I appreciate your continued commitment to your health goals. Please review the care plan we discussed, and feel free to reach out if I can assist you further.  Medicare recommends these wellness visits once per year to help you and your care team stay ahead of potential health issues. These visits are designed to focus on prevention, allowing your provider to concentrate on managing your acute and chronic conditions during your regular appointments.  Please note that Annual Wellness Visits do not include a physical exam. Some assessments may be limited, especially if the visit was conducted virtually. If needed, we may recommend a separate in-person follow-up with your provider.  Ongoing Care Seeing your primary care provider every 3 to 6 months helps us  monitor your health and provide consistent, personalized care.   Referrals If a referral was made during today's visit and you haven't received any updates within two weeks, please contact the referred provider directly to check on the status.  Recommended Screenings:  Health Maintenance  Topic Date Due   COVID-19 Vaccine (1 - 2024-25 season) 03/19/2024*   Flu Shot  09/07/2024*   Colon Cancer Screening  03/03/2025*   Eye exam for diabetics  07/20/2024   Hemoglobin A1C  08/31/2024   DEXA scan (bone density measurement)  10/01/2024   Yearly kidney function blood test for diabetes  03/03/2025   Yearly kidney health urinalysis for diabetes  03/03/2025   Complete foot exam   03/03/2025   Medicare Annual Wellness Visit  03/10/2025   DTaP/Tdap/Td vaccine (3 - Td or Tdap) 12/09/2028   Pneumococcal Vaccine for age over 5  Completed   Hepatitis C Screening  Completed   Zoster (Shingles) Vaccine  Completed   HPV Vaccine  Aged Out   Meningitis B Vaccine  Aged Out   Breast Cancer Screening  Discontinued  *Topic was postponed. The date shown is not the original due date.        05/19/2023    3:38 PM  Advanced Directives  Does Patient Have a Medical Advance Directive? No   Advance Care Planning is important because it: Ensures you receive medical care that aligns with your values, goals, and preferences. Provides guidance to your family and loved ones, reducing the emotional burden of decision-making during critical moments.  Vision: Annual vision screenings are recommended for early detection of glaucoma, cataracts, and diabetic retinopathy. These exams can also reveal signs of chronic conditions such as diabetes and high blood pressure.  Dental: Annual dental screenings help detect early signs of oral cancer, gum disease, and other conditions linked to overall health, including heart disease and diabetes.  Please see the attached documents for additional preventive care recommendations.

## 2024-03-10 NOTE — Progress Notes (Signed)
 Subjective:   Lisa Mcgee is a 78 y.o. who presents for a Medicare Wellness preventive visit.  As a reminder, Annual Wellness Visits don't include a physical exam, and some assessments may be limited, especially if this visit is performed virtually. We may recommend an in-person follow-up visit with your provider if needed.  Visit Complete: Virtual I connected with  Lisa Mcgee on 03/10/24 by a audio enabled telemedicine application and verified that I am speaking with the correct person using two identifiers.  Patient Location: Home  Provider Location: Office/Clinic  I discussed the limitations of evaluation and management by telemedicine. The patient expressed understanding and agreed to proceed.  Vital Signs: Because this visit was a virtual/telehealth visit, some criteria may be missing or patient reported. Any vitals not documented were not able to be obtained and vitals that have been documented are patient reported.  VideoDeclined- This patient declined Librarian, academic. Therefore the visit was completed with audio only.  Persons Participating in Visit: Patient.  AWV Questionnaire: No: Patient Medicare AWV questionnaire was not completed prior to this visit.  Cardiac Risk Factors include: advanced age (>11men, >104 women);diabetes mellitus     Objective:    Today's Vitals   03/10/24 1423  Weight: 137 lb (62.1 kg)  Height: 5' 6 (1.676 m)   Body mass index is 22.11 kg/m.     03/10/2024    2:36 PM 05/19/2023    3:38 PM 09/28/2021   11:27 AM  Advanced Directives  Does Patient Have a Medical Advance Directive? Yes No No  Type of Estate agent of [place];Living will    Copy of Healthcare Power of Attorney in Chart? Yes - validated most recent copy scanned in chart (See row information)    Would patient like information on creating a medical advance directive?   No - Patient declined    Current Medications  (verified) Outpatient Encounter Medications as of 03/10/2024  Medication Sig   Calcium Carbonate-Vitamin D  (CALCIUM 600 + D PO) Take 1 tablet by mouth 2 (two) times daily.   cetirizine (ZYRTEC) 10 MG tablet Take 10 mg by mouth daily.   Cholecalciferol (VITAMIN D3) 2000 UNITS TABS Take 1 tablet by mouth 2 (two) times daily.   clobetasol cream (TEMOVATE) 0.05 % Apply topically.   COD LIVER OIL PO Take by mouth.   Coenzyme Q10 (COQ10 PO) Take 300 mg by mouth daily.   fish oil-omega-3 fatty acids 1000 MG capsule Take 2 g by mouth daily.   Flaxseed, Linseed, (FLAX SEED OIL) 1000 MG CAPS Take 1 capsule by mouth 2 (two) times daily.   Garlic 100 MG TABS Take 1,000 mg by mouth 2 (two) times daily.   ibuprofen (ADVIL,MOTRIN) 200 MG tablet Take 200 mg by mouth. Take 3 pills prn   ketoconazole  (NIZORAL ) 2 % shampoo Apply topically.   Multiple Vitamins-Minerals (CENTRUM SILVER 50+WOMEN PO) Take by mouth.   mupirocin  ointment (BACTROBAN ) 2 % Apply 1 Application topically as needed.   pyridOXINE (VITAMIN B-6) 100 MG tablet Take 100 mg by mouth 2 (two) times daily.   triamcinolone cream (KENALOG) 0.1 % Apply 1 application topically as needed.   vitamin B-12 (CYANOCOBALAMIN) 1000 MCG tablet Take 1,000 mcg by mouth daily.   zinc gluconate 50 MG tablet Take 50 mg by mouth 2 (two) times daily.   No facility-administered encounter medications on file as of 03/10/2024.    Allergies (verified) Atropine, Belladonna alkaloids, Doxycycline , Prevnar [pneumococcal 13-val conj  vacc], Egg-derived products, Lorazepam , and Other   History: Past Medical History:  Diagnosis Date   Allergy     Arthritis    bil fingers   Cataract    bil cataracts   Diabetes mellitus without complication (HCC)    diet controlled   Environmental allergies    Ganglion cyst of left foot 06/22/2013   Hyperlipidemia    low HDL   Osteopenia    Past Surgical History:  Procedure Laterality Date   COLONOSCOPY  2007   Adenomatous polyp    COLONOSCOPY  2014   neg   GANGLION CYST EXCISION Left    left foot   POLYPECTOMY     TONSILLECTOMY AND ADENOIDECTOMY  age 28   TRIGGER FINGER RELEASE Left 08/15/2016   Family History  Problem Relation Age of Onset   Allergies Mother    Diabetes Mother    Heart attack Father        > 60   Diabetes Sister    Alzheimer's disease Brother    Colon cancer Maternal Grandfather    Colitis Maternal Grandfather        died at 40 years old   Stroke Neg Hx    Cancer Neg Hx    Esophageal cancer Neg Hx    Rectal cancer Neg Hx    Stomach cancer Neg Hx    Social History   Socioeconomic History   Marital status: Widowed    Spouse name: Not on file   Number of children: Not on file   Years of education: Not on file   Highest education level: Not on file  Occupational History   Occupation: Retired/  Tobacco Use   Smoking status: Never   Smokeless tobacco: Never  Vaping Use   Vaping status: Never Used  Substance and Sexual Activity   Alcohol use: No   Drug use: No   Sexual activity: Not on file  Other Topics Concern   Not on file  Social History Narrative   Exercise:  Water aerobics twice a week   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   Lives alone   Social Drivers of Health   Financial Resource Strain: Low Risk  (03/10/2024)   Overall Financial Resource Strain (CARDIA)    Difficulty of Paying Living Expenses: Not hard at all  Food Insecurity: No Food Insecurity (03/10/2024)   Hunger Vital Sign    Worried About Running Out of Food in the Last Year: Never true    Ran Out of Food in the Last Year: Never true  Transportation Needs: No Transportation Needs (03/10/2024)   PRAPARE - Administrator, Civil Service (Medical): No    Lack of Transportation (Non-Medical): No  Physical Activity: Sufficiently Active (03/10/2024)   Exercise Vital Sign    Days of Exercise per Week: 7 days    Minutes of Exercise per Session: 40 min  Stress: No Stress Concern Present (03/10/2024)    [employer] of Occupational Health - Occupational Stress Questionnaire    Feeling of Stress: Not at all  Social Connections: Moderately Integrated (03/10/2024)   Social Connection and Isolation Panel    Frequency of Communication with Friends and Family: More than three times a week    Frequency of Social Gatherings with Friends and Family: More than three times a week    Attends Religious Services: More than 4 times per year    Active Member of [employer] or Organizations: Yes    Attends Banker Meetings: More than  4 times per year    Marital Status: Widowed    Tobacco Counseling Counseling given: Not Answered    Clinical Intake:  Pre-visit preparation completed: Yes  Pain : No/denies pain     BMI - recorded: 22.11 Nutritional Status: BMI of 19-24  Normal Nutritional Risks: None Diabetes: Yes CBG done?: No Did pt. bring in CBG monitor from home?: No  Lab Results  Component Value Date   HGBA1C 6.5 03/03/2024   HGBA1C 6.2 11/26/2022   HGBA1C 6.0 11/21/2021     How often do you need to have someone help you when you read instructions, pamphlets, or other written materials from your doctor or pharmacy?: 1 - Never  Interpreter Needed?: No  Comments: lives alone Information entered by :: B.Laritza Vokes,LPN   Activities of Daily Living     03/10/2024    2:51 PM 05/19/2023    3:32 PM  In your present state of health, do you have any difficulty performing the following activities:  Hearing? 0 0  Vision? 0 0  Difficulty concentrating or making decisions? 0 0  Walking or climbing stairs? 0 0  Dressing or bathing? 0 0  Doing errands, shopping? 0 0  Preparing Food and eating ? N N  Using the Toilet? N N  In the past six months, have you accidently leaked urine? N N  Do you have problems with loss of bowel control? N N  Managing your Medications? N N  Managing your Finances? N N  Housekeeping or managing your Housekeeping? N N    Patient Care  Team: Geofm Glade PARAS, MD as PCP - General (Internal Medicine) Johnnye Ade, MD as Consulting Physician (Obstetrics and Gynecology) [place], Heather, OD (Optometry)  I have updated your Care Teams any recent Medical Services you may have received from other providers in the past year.     Assessment:   This is a routine wellness examination for Lisa Mcgee.  Hearing/Vision screen Hearing Screening - Comments:: Patient denies any hearing difficulties.   Vision Screening - Comments:: Pt says their vision is good with glasses Dr Heather [place]    Goals Addressed             This Visit's Progress    Patient Stated   On track    03/10/24-Maintain health       Depression Screen     03/10/2024    2:33 PM 05/19/2023    3:43 PM 11/26/2022    8:01 AM 10/14/2022    8:46 AM 11/21/2021    7:57 AM 11/21/2021    7:56 AM 09/28/2021   11:28 AM  PHQ 2/9 Scores  PHQ - 2 Score 0 0 0 0 0 0 0  PHQ- 9 Score  0   0      Fall Risk     03/10/2024    2:28 PM 03/03/2024    8:27 AM 05/19/2023    3:39 PM 11/26/2022    8:01 AM 10/14/2022    8:45 AM  Fall Risk   Falls in the past year? 0 0 0 0 0  Number falls in past yr: 0 0 0 0 0  Injury with Fall? 0 0 0 0 0  Risk for fall due to : No Fall Risks No Fall Risks No Fall Risks No Fall Risks No Fall Risks  Follow up Falls prevention discussed;Education provided Falls evaluation completed Falls prevention discussed;Falls evaluation completed Falls evaluation completed Falls evaluation completed    MEDICARE RISK AT HOME:  Medicare  Risk at Home Any stairs in or around the home?: Yes If so, are there any without handrails?: Yes Home free of loose throw rugs in walkways, pet beds, electrical cords, etc?: Yes Adequate lighting in your home to reduce risk of falls?: Yes Life alert?: No Use of a cane, walker or w/c?: No Grab bars in the bathroom?: Yes Shower chair or bench in shower?: No Elevated toilet seat or a handicapped toilet?: No  TIMED UP AND  GO:  Was the test performed?  No  Cognitive Function: 6CIT completed        03/10/2024    2:54 PM 05/19/2023    3:40 PM  6CIT Screen  What Year? 0 points 0 points  What month? 0 points 0 points  What time? 0 points 0 points  Count back from 20 0 points 0 points  Months in reverse 0 points 0 points  Repeat phrase 2 points 0 points  Total Score 2 points 0 points    Immunizations Immunization History  Administered Date(s) Administered   Fluad Quad(high Dose 65+) 03/04/2019, 04/05/2020, 05/28/2021   INFLUENZA, HIGH DOSE SEASONAL PF 03/16/2013, 03/01/2014, 02/14/2015, 03/01/2016, 03/11/2017   Pneumococcal Conjugate-13 02/28/2015   Pneumococcal Polysaccharide-23 03/26/2013   Td 04/20/2008   Tdap 12/10/2018   Zoster Recombinant(Shingrix) 12/26/2017, 04/03/2018   Zoster, Live 04/01/2013    Screening Tests Health Maintenance  Topic Date Due   COVID-19 Vaccine (1 - 2024-25 season) 03/19/2024 (Originally 02/09/2024)   Influenza Vaccine  09/07/2024 (Originally 01/09/2024)   Colonoscopy  03/03/2025 (Originally 02/26/2023)   OPHTHALMOLOGY EXAM  07/20/2024   HEMOGLOBIN A1C  08/31/2024   DEXA SCAN  10/01/2024   Diabetic kidney evaluation - eGFR measurement  03/03/2025   Diabetic kidney evaluation - Urine ACR  03/03/2025   FOOT EXAM  03/03/2025   Medicare Annual Wellness (AWV)  03/10/2025   DTaP/Tdap/Td (3 - Td or Tdap) 12/09/2028   Pneumococcal Vaccine: 50+ Years  Completed   Hepatitis C Screening  Completed   Zoster Vaccines- Shingrix  Completed   HPV VACCINES  Aged Out   Meningococcal B Vaccine  Aged Out   Mammogram  Discontinued    Health Maintenance Items Addressed: None at this time  Additional Screening:  Vision Screening: Recommended annual ophthalmology exams for early detection of glaucoma and other disorders of the eye. Is the patient up to date with their annual eye exam?  Yes  Who is the provider or what is the name of the office in which the patient attends annual  eye exams? Dr [place]  Dental Screening: Recommended annual dental exams for proper oral hygiene  Community Resource Referral / Chronic Care Management: CRR required this visit?  No   CCM required this visit?  No   Plan:    I have personally reviewed and noted the following in the patient's chart:   Medical and social history Use of alcohol, tobacco or illicit drugs  Current medications and supplements including opioid prescriptions. Patient is not currently taking opioid prescriptions. Functional ability and status Nutritional status Physical activity Advanced directives List of other physicians Hospitalizations, surgeries, and ER visits in previous 12 months Vitals Screenings to include cognitive, depression, and falls Referrals and appointments  In addition, I have reviewed and discussed with patient certain preventive protocols, quality metrics, and best practice recommendations. A written personalized care plan for preventive services as well as general preventive health recommendations were provided to patient.   Erminio LITTIE Saris, LPN   89/01/7973   After  Visit Summary: (MyChart) Due to this being a telephonic visit, the after visit summary with patients personalized plan was offered to patient via MyChart   Notes: Nothing significant to report at this time.

## 2024-03-29 ENCOUNTER — Other Ambulatory Visit (HOSPITAL_COMMUNITY): Payer: Self-pay

## 2024-05-17 DIAGNOSIS — H00022 Hordeolum internum right lower eyelid: Secondary | ICD-10-CM | POA: Diagnosis not present

## 2024-06-20 ENCOUNTER — Encounter: Payer: Self-pay | Admitting: Internal Medicine

## 2024-06-20 DIAGNOSIS — R809 Proteinuria, unspecified: Secondary | ICD-10-CM | POA: Insufficient documentation

## 2024-06-20 NOTE — Patient Instructions (Addendum)
" ° ° ° ° °  Blood work was ordered.       Medications changes include :   None    A referral was ordered and someone will call you to schedule an appointment.     Return in about 6 months (around 12/19/2024) for follow up.  "

## 2024-06-20 NOTE — Progress Notes (Unsigned)
 "     Subjective:    Patient ID: Lisa Mcgee, female    DOB: [birth year], 79 y.o.   MRN: [id number]     HPI Lisa Mcgee is here for follow up of her chronic medical problems.  She has been going to the gym - doing water aerobics and will start the treadmill.    She has stopped drinking tea.  She replaced it with water.  She has tried to decrease any sugar she was consuming.   Medications and allergies reviewed with patient and updated if appropriate.  Medications Ordered Prior to Encounter[1]   Review of Systems  Respiratory:  Negative for shortness of breath.   Cardiovascular:  Negative for chest pain and palpitations.       Objective:   Vitals:   06/21/24 0856  BP: 118/74  Pulse: 80  Temp: 98.4 F (36.9 C)  SpO2: 97%   BP Readings from Last 3 Encounters:  06/21/24 118/74  03/03/24 120/80  11/24/23 124/64   Wt Readings from Last 3 Encounters:  06/21/24 128 lb (58.1 kg)  03/10/24 137 lb (62.1 kg)  03/03/24 137 lb (62.1 kg)   Body mass index is 20.66 kg/m.    Physical Exam Constitutional:      General: She is not in acute distress.    Appearance: Normal appearance.  HENT:     Head: Normocephalic and atraumatic.  Eyes:     Conjunctiva/sclera: Conjunctivae normal.  Cardiovascular:     Rate and Rhythm: Normal rate and regular rhythm.     Heart sounds: Normal heart sounds.  Pulmonary:     Effort: Pulmonary effort is normal. No respiratory distress.     Breath sounds: Normal breath sounds. No wheezing.  Musculoskeletal:     Cervical back: Neck supple.     Right lower leg: No edema.     Left lower leg: No edema.  Lymphadenopathy:     Cervical: No cervical adenopathy.  Skin:    General: Skin is warm and dry.     Findings: No rash.  Neurological:     Mental Status: She is alert. Mental status is at baseline.  Psychiatric:        Mood and Affect: Mood normal.        Behavior: Behavior normal.        Lab Results  Component Value Date   WBC 7.1  03/03/2024   HGB 13.6 03/03/2024   HCT 39.7 03/03/2024   PLT 173.0 03/03/2024   GLUCOSE 131 (H) 03/03/2024   CHOL 140 03/03/2024   TRIG 45.0 03/03/2024   HDL 65.30 03/03/2024   LDLCALC 66 03/03/2024   ALT 15 03/03/2024   AST 24 03/03/2024   NA 134 (L) 03/03/2024   K 4.1 03/03/2024   CL 98 03/03/2024   CREATININE 0.83 03/03/2024   BUN 12 03/03/2024   CO2 29 03/03/2024   TSH 1.18 03/03/2024   HGBA1C 6.5 03/03/2024   MICROALBUR 1.1 03/03/2024     Assessment & Plan:    See Problem List for Assessment and Plan of chronic medical problems.        [1]  Current Outpatient Medications on File Prior to Visit  Medication Sig Dispense Refill   Calcium Carbonate-Vitamin D  (CALCIUM 600 + D PO) Take 1 tablet by mouth 2 (two) times daily.     cetirizine (ZYRTEC) 10 MG tablet Take 10 mg by mouth daily.     Cholecalciferol (VITAMIN D3) 2000 UNITS TABS Take 1 tablet by mouth  2 (two) times daily.     clobetasol cream (TEMOVATE) 0.05 % Apply topically.     COD LIVER OIL PO Take by mouth.     Coenzyme Q10 (COQ10 PO) Take 300 mg by mouth daily.     fish oil-omega-3 fatty acids 1000 MG capsule Take 2 g by mouth daily.     Flaxseed, Linseed, (FLAX SEED OIL) 1000 MG CAPS Take 1 capsule by mouth 2 (two) times daily.     Garlic 100 MG TABS Take 1,000 mg by mouth 2 (two) times daily.     ibuprofen (ADVIL,MOTRIN) 200 MG tablet Take 200 mg by mouth. Take 3 pills prn     ketoconazole  (NIZORAL ) 2 % shampoo Apply topically.     Multiple Vitamins-Minerals (CENTRUM SILVER 50+WOMEN PO) Take by mouth.     mupirocin  ointment (BACTROBAN ) 2 % Apply 1 Application topically as needed. 22 g 0   pyridOXINE (VITAMIN B-6) 100 MG tablet Take 100 mg by mouth 2 (two) times daily.     triamcinolone cream (KENALOG) 0.1 % Apply 1 application topically as needed.     vitamin B-12 (CYANOCOBALAMIN) 1000 MCG tablet Take 1,000 mcg by mouth daily.     zinc gluconate 50 MG tablet Take 50 mg by mouth 2 (two) times daily.      No current facility-administered medications on file prior to visit.   "

## 2024-06-21 ENCOUNTER — Ambulatory Visit: Admitting: Internal Medicine

## 2024-06-21 VITALS — BP 118/74 | HR 80 | Temp 98.4°F | Ht 66.0 in | Wt 128.0 lb

## 2024-06-21 DIAGNOSIS — E119 Type 2 diabetes mellitus without complications: Secondary | ICD-10-CM

## 2024-06-21 DIAGNOSIS — R809 Proteinuria, unspecified: Secondary | ICD-10-CM

## 2024-06-21 LAB — BASIC METABOLIC PANEL WITH GFR
BUN: 11 mg/dL (ref 6–23)
CO2: 28 meq/L (ref 19–32)
Calcium: 8.9 mg/dL (ref 8.4–10.5)
Chloride: 100 meq/L (ref 96–112)
Creatinine, Ser: 0.75 mg/dL (ref 0.40–1.20)
GFR: 77.42 mL/min
Glucose, Bld: 119 mg/dL — ABNORMAL HIGH (ref 70–99)
Potassium: 4 meq/L (ref 3.5–5.1)
Sodium: 134 meq/L — ABNORMAL LOW (ref 135–145)

## 2024-06-21 LAB — HEMOGLOBIN A1C: Hgb A1c MFr Bld: 5.8 % (ref 4.6–6.5)

## 2024-06-21 LAB — MICROALBUMIN / CREATININE URINE RATIO
Creatinine,U: 47.8 mg/dL
Microalb Creat Ratio: UNDETERMINED mg/g (ref 0.0–30.0)
Microalb, Ur: <0.7 mg/dL

## 2024-06-21 NOTE — Assessment & Plan Note (Addendum)
 Chronic Diet controlled Check A1c, BMP, urine albumin/creatinine ratio Sugars have been well-controlled, but slightly higher at her last visit.  Also found to have mild microalbuminuria She has revised her diet Continue regular exercise Deferred statin

## 2024-06-21 NOTE — Assessment & Plan Note (Addendum)
 With last appointment 3 months ago she was found to have mild albuminuria Sugars well-controlled She has revised her diet She would like to avoid any other medications including ACE/ARB, diabetic medication Recheck urine albumin/creatinine ratio today, A1c, BMP Continue to work on lifestyle

## 2024-06-24 ENCOUNTER — Ambulatory Visit: Payer: Self-pay | Admitting: Internal Medicine

## 2025-03-14 ENCOUNTER — Ambulatory Visit

## 2025-06-23 ENCOUNTER — Encounter: Admitting: Internal Medicine
